# Patient Record
Sex: Female | Born: 1965
Health system: Southern US, Community
[De-identification: ages and names within clinical notes are randomized; demographics above are authoritative.]

## PROBLEM LIST (undated history)

## (undated) DIAGNOSIS — Z87442 Personal history of urinary calculi: Secondary | ICD-10-CM

## (undated) DIAGNOSIS — M069 Rheumatoid arthritis, unspecified: Secondary | ICD-10-CM

## (undated) DIAGNOSIS — R7303 Prediabetes: Secondary | ICD-10-CM

## (undated) DIAGNOSIS — G4730 Sleep apnea, unspecified: Secondary | ICD-10-CM

## (undated) HISTORY — PX: TUBAL LIGATION: SHX77

## (undated) HISTORY — PX: ROTATOR CUFF REPAIR: SHX139

## (undated) HISTORY — PX: FRACTURE SURGERY: SHX138

## (undated) HISTORY — PX: ANKLE FRACTURE SURGERY: SHX122

## (undated) HISTORY — PX: HAND SURGERY: SHX662

---

## 2000-04-29 ENCOUNTER — Encounter (INDEPENDENT_AMBULATORY_CARE_PROVIDER_SITE_OTHER): Payer: Self-pay | Admitting: Specialist

## 2000-04-29 ENCOUNTER — Inpatient Hospital Stay (HOSPITAL_COMMUNITY): Admission: AD | Admit: 2000-04-29 | Discharge: 2000-05-02 | Payer: Self-pay | Admitting: Obstetrics and Gynecology

## 2000-05-03 ENCOUNTER — Encounter: Admission: RE | Admit: 2000-05-03 | Discharge: 2000-07-08 | Payer: Self-pay | Admitting: Obstetrics and Gynecology

## 2001-07-13 ENCOUNTER — Other Ambulatory Visit: Admission: RE | Admit: 2001-07-13 | Discharge: 2001-07-13 | Payer: Self-pay | Admitting: Obstetrics and Gynecology

## 2002-08-10 ENCOUNTER — Other Ambulatory Visit: Admission: RE | Admit: 2002-08-10 | Discharge: 2002-08-10 | Payer: Self-pay | Admitting: Obstetrics and Gynecology

## 2003-10-27 ENCOUNTER — Other Ambulatory Visit: Admission: RE | Admit: 2003-10-27 | Discharge: 2003-10-27 | Payer: Self-pay | Admitting: Obstetrics and Gynecology

## 2004-03-08 ENCOUNTER — Encounter: Admission: RE | Admit: 2004-03-08 | Discharge: 2004-03-08 | Payer: Self-pay | Admitting: Family Medicine

## 2004-11-04 ENCOUNTER — Other Ambulatory Visit: Admission: RE | Admit: 2004-11-04 | Discharge: 2004-11-04 | Payer: Self-pay | Admitting: Obstetrics and Gynecology

## 2006-10-08 ENCOUNTER — Inpatient Hospital Stay (HOSPITAL_COMMUNITY): Admission: EM | Admit: 2006-10-08 | Discharge: 2006-10-15 | Payer: Self-pay | Admitting: Emergency Medicine

## 2006-10-12 ENCOUNTER — Encounter (INDEPENDENT_AMBULATORY_CARE_PROVIDER_SITE_OTHER): Payer: Self-pay | Admitting: Specialist

## 2008-11-27 ENCOUNTER — Ambulatory Visit (HOSPITAL_BASED_OUTPATIENT_CLINIC_OR_DEPARTMENT_OTHER): Admission: RE | Admit: 2008-11-27 | Discharge: 2008-11-27 | Payer: Self-pay | Admitting: Orthopedic Surgery

## 2009-09-10 ENCOUNTER — Other Ambulatory Visit: Admission: RE | Admit: 2009-09-10 | Discharge: 2009-09-10 | Payer: Self-pay | Admitting: Family Medicine

## 2009-10-20 ENCOUNTER — Emergency Department (HOSPITAL_COMMUNITY): Admission: EM | Admit: 2009-10-20 | Discharge: 2009-10-20 | Payer: Self-pay | Admitting: Emergency Medicine

## 2010-09-17 ENCOUNTER — Other Ambulatory Visit: Payer: Self-pay | Admitting: Physician Assistant

## 2010-09-17 ENCOUNTER — Other Ambulatory Visit (HOSPITAL_COMMUNITY)
Admission: RE | Admit: 2010-09-17 | Discharge: 2010-09-17 | Disposition: A | Discharge status: Home / Self Care | Payer: BC Managed Care – PPO | Source: Ambulatory Visit | Attending: Family Medicine | Admitting: Family Medicine

## 2010-09-17 DIAGNOSIS — Z01419 Encounter for gynecological examination (general) (routine) without abnormal findings: Secondary | ICD-10-CM | POA: Insufficient documentation

## 2010-09-24 LAB — POCT I-STAT, CHEM 8
BUN: 10 mg/dL (ref 6–23)
Calcium, Ion: 1.17 mmol/L (ref 1.12–1.32)
Chloride: 104 mEq/L (ref 96–112)
Creatinine, Ser: 0.8 mg/dL (ref 0.4–1.2)
Glucose, Bld: 88 mg/dL (ref 70–99)
HCT: 41 % (ref 36.0–46.0)
Hemoglobin: 13.9 g/dL (ref 12.0–15.0)
Potassium: 3.5 mEq/L (ref 3.5–5.1)
Sodium: 139 mEq/L (ref 135–145)
TCO2: 25 mmol/L (ref 0–100)

## 2010-09-24 LAB — URINALYSIS, ROUTINE W REFLEX MICROSCOPIC
Glucose, UA: NEGATIVE mg/dL
Leukocytes, UA: NEGATIVE
Nitrite: NEGATIVE
Protein, ur: NEGATIVE mg/dL
Specific Gravity, Urine: 1.024 (ref 1.005–1.030)
Urobilinogen, UA: 0.2 mg/dL (ref 0.0–1.0)
pH: 6 (ref 5.0–8.0)

## 2010-09-24 LAB — CBC
HCT: 40.4 % (ref 36.0–46.0)
Hemoglobin: 14.2 g/dL (ref 12.0–15.0)
MCHC: 35 g/dL (ref 30.0–36.0)
MCV: 83.1 fL (ref 78.0–100.0)
Platelets: 307 10*3/uL (ref 150–400)
RBC: 4.86 MIL/uL (ref 3.87–5.11)
RDW: 12.5 % (ref 11.5–15.5)
WBC: 12 10*3/uL — ABNORMAL HIGH (ref 4.0–10.5)

## 2010-09-24 LAB — DIFFERENTIAL
Basophils Absolute: 0 10*3/uL (ref 0.0–0.1)
Basophils Relative: 0 % (ref 0–1)
Eosinophils Absolute: 0.3 10*3/uL (ref 0.0–0.7)
Eosinophils Relative: 2 % (ref 0–5)
Lymphocytes Relative: 19 % (ref 12–46)
Lymphs Abs: 2.3 10*3/uL (ref 0.7–4.0)
Monocytes Absolute: 1.3 10*3/uL — ABNORMAL HIGH (ref 0.1–1.0)
Monocytes Relative: 11 % (ref 3–12)
Neutro Abs: 8.1 10*3/uL — ABNORMAL HIGH (ref 1.7–7.7)
Neutrophils Relative %: 68 % (ref 43–77)

## 2010-09-24 LAB — URINE CULTURE: Colony Count: 2000

## 2010-09-24 LAB — URINE MICROSCOPIC-ADD ON

## 2010-10-15 LAB — POCT HEMOGLOBIN-HEMACUE: Hemoglobin: 13.6 g/dL (ref 12.0–15.0)

## 2010-11-19 NOTE — Op Note (Signed)
[name], [name]                ACCOUNT NO.:  [id number]   MEDICAL RECORD NO.:  [id number]          PATIENT TYPE:  AMB   LOCATION:  DSC                          FACILITY:  [facility]   PHYSICIAN:  Robert A. Thurston Hole, M.D. DATE OF BIRTH:  1965/11/22   DATE OF PROCEDURE:  11/27/2008  DATE OF DISCHARGE:                               OPERATIVE REPORT   PREOPERATIVE DIAGNOSES:  1. Right shoulder partial rotator cuff tear.  2. Right shoulder partial labrum tear.  3. Right shoulder impingement.  4. Right shoulder acromioclavicular joint degenerative joint disease      and spurring.   POSTOPERATIVE DIAGNOSES:  1. Right shoulder partial rotator cuff tear.  2. Right shoulder partial labrum tear.  3. Right shoulder impingement.  4. Right shoulder acromioclavicular joint degenerative joint disease      and spurring.   PROCEDURE:  1. Right shoulder examination under anesthesia followed by      arthroscopic debridement partial rotator cuff tear and partial      labrum tear.  2. Right shoulder subacromial decompression.  3. Right shoulder distal clavicle excision.   SURGEON:  Elana Alm. Thurston Hole, MD   ASSISTANT:  Julien Girt, PA   ANESTHESIA:  General.   OPERATIVE TIME:  45 minutes.   COMPLICATIONS:  None.   INDICATIONS FOR PROCEDURE:  [name] is a 42-year woman who has had over  6 months of increasing right shoulder pain with exam and MRI documenting  partial versus complete rotator cuff tear with impingement labral tear  and AC joint arthropathy.  She has failed conservative care and is now  to undergo arthroscopy.   DESCRIPTION:  [name] was brought to the operating room on Nov 27, 2008, after an interscalene block was placed on holding room by  anesthesia.  She was placed on operative table in supine position.  She  received vancomycin 1 g IV preoperatively for prophylaxis.  After being  placed under general anesthesia, her right shoulder was examined.  She  had full range  of motion.  Her shoulder was stable to ligamentous exam.  She was then placed in beach chair position, her shoulder arm was  prepped using sterile DuraPrep and draped using sterile technique.  Originally, through a posterior arthroscopic portal, the arthroscope  with a pump attached and was placed into an anterior portal.  Arthroscopic probe was placed.  On initial inspection, the articular  cartilage and a glenohumeral joint was intact.  She had partial tearing  in the anterior, superior, and posterior labrum 25-30%, which was  debrided.  Biceps tendon anchor and biceps tendon was intact.  Anterior  inferior labrum and anterior inferior glenohumeral ligament complex was  intact.  The rotator cuff showed partial tearing 25% of the  supraspinatus, which was debrided, but it was otherwise well attached.  The rest of the rotator cuff was intact.  Inferior capsular recess free  of pathology.  Subacromial space was entered and a lateral arthroscopic  portal was made.  Moderately thickened bursitis was resected.  The  rotator cuff was very inflamed and thickened on the  bursal surface, but  no evidence of significant tearing.  Impingement was noted and the  subacromial decompression was carried out removing 6-8 mm of the  undersurface of the anterior, anterolateral, anteromedial acromion and  CA ligament release carried out as well.  The [facility] joint showed  significant spurring and degenerative changes and the distal 6 mm of the  clavicle was resected with a 6-mm bur.  After this was done, the  shoulder could be brought through full range of motion with no  impingement on the rotator cuff.  At this point, it was felt that all  pathology had been satisfactorily addressed.  Instruments were removed.  Portals were closed with 3-0 nylon suture.  The shoulder injected with  80 mg of Depo-Medrol and 10 mg of 0.2% Marcaine with epinephrine with  half of this in the joint and half in the subacromial space.   Sterile  dressings and a sling were then applied, and the patient awakened and  taken to recovery room in stable condition.   FOLLOWUP CARE:  [name] will be followed as an outpatient on Nucynta  and Robaxin with early physical therapy.  She will be back in the office  in a week for sutures out and follow up.      Robert A. Thurston Hole, M.D.  Electronically Signed     RAW/MEDQ  D:  11/27/2008  T:  11/28/2008  Job:  [id number]

## 2010-11-22 NOTE — Op Note (Signed)
NAMEJANAYIA, [name]                ACCOUNT NO.:  [id number]   MEDICAL RECORD NO.:  [id number]          PATIENT TYPE:  INP   LOCATION:  6712                         FACILITY:  [facility]   PHYSICIAN:  John C. Madilyn Fireman, M.D.    DATE OF BIRTH:  12-Apr-1966   DATE OF PROCEDURE:  10/12/2006  DATE OF DISCHARGE:                               OPERATIVE REPORT   PROCEDURE:  Colonoscopy with biopsy.   INDICATIONS FOR PROCEDURE:  Persistent rectal bleeding, abdominal cramps  and diarrhea, not responding to Cipro and Flagyl.   PROCEDURE:  The patient was placed in the left lateral decubitus  position and placed on the pulse monitor with continuous low-flow oxygen  delivered by nasal cannula.  She was sedated with 100 mcg IV fentanyl  and 10 mg IV Versed.  The Olympus video colonoscope was inserted into  the rectum and advanced to the cecum, confirmed by transillumination of  McBurney's point and visualization of the ileocecal valve and  appendiceal orifice.  Prep was good.  The cecum and ascending colon  appeared normal with no masses, polyps, diverticula or other mucosal  abnormalities.  From the transverse colon down to the rectum, there was  erythema, granularity, and friability in a diffuse distribution without  any deep ulcers, pseudomembranes, or submucosal hemorrhages. Overall,  this was felt most consistent with ulcerative colitis, although  infectious colitis could not be ruled out.  Multiple random biopsies  were taken.  No other lesions were seen.  The rectum was not spared the  inflammation.  The scope was then withdrawn.  The patient returned to  the recovery room in stable condition.  She tolerated the procedure  well.  There were no immediate complications.   IMPRESSION:  Diffuse colitis estimated to the proximal or mid transverse  colon.   PLAN:  We will await biopsy results and will go ahead and begin  corticosteroids since he has already been started on antibiotics.     ______________________________  Everardo All Madilyn Fireman, M.D.     JCH/MEDQ  D:  10/12/2006  T:  10/12/2006  Job:  [id number]   cc:   Lucita Ferrara, MD

## 2010-11-22 NOTE — Discharge Summary (Signed)
[name], [name]                ACCOUNT NO.:  [id number]   MEDICAL RECORD NO.:  [id number]          PATIENT TYPE:  INP   LOCATION:  6732                         FACILITY:  [facility]   PHYSICIAN:  Barnetta Chapel, MDDATE OF BIRTH:  [birth year]   DATE OF ADMISSION:  10/08/2006  DATE OF DISCHARGE:  10/15/2006                               DISCHARGE SUMMARY   PRIMARY CARE PHYSICIAN:  Donia Guiles, M.D.   DISCHARGE DIAGNOSES:  Colitis, likely ulcerative colitis.   DISCHARGE MEDICATIONS:  1. Tapering dose of  steroids (prednisone).  2. Asacol 800 mg p.o. t.i.d.   CONSULTATIONS:  The GI team consultation.  The patient was seen by GI  team.  The patient had a colonoscopy that revealed diffuse colitis.  The  colonoscopy findings were said to be suggestive of possible ulcerative  colitis.  The pathology report showed inflammation and abscesses.   IMAGING STUDIES:  A CT scan of the abdomen.   HISTORY OF PRESENT ILLNESS:  Please refer to the H&P done on October 08, 2006.  The patient is a 45 year old female who presented with severe  abdominal pain and bloated with diarrhea.  On admission the patient was  dehydrated and also hypokalemic.   HOSPITAL COURSE:  The patient was admitted to the regular medical floor.  The patient was kept n.p.o.  The patient was adequately re-hydrated.  The hypokalemia was repleted.  The patient was initially started on IV  Cipro and Flagyl.  A GI consultation was called to further assess the  patient.  The patient eventually had the colonoscopy that was suggestive  of possible ulcerative colitis.  The patient was started on steroids.  The antibiotics were discontinued.  The patient's symptoms improved  gradually.  The p.o. intake was gradually introduced.  The patient  continued to do well and will be discharged back home to the care of the  primary care [name].   DISCHARGE PLAN:  1. To discharge the patient home on the above medications.  2. Follow up  with her primary care [name] in one week.  3. Follow up with GI.  4. A tapering dose of prednisone.  5. Continue Asacol 800 mg p.o. t.i.d.  6. A regular diet.  7. Activities as tolerated.      Barnetta Chapel, MD  Electronically Signed     SIO/MEDQ  D:  01/26/2007  T:  01/26/2007  Job:  [id number]   cc:   Donia Guiles, M.D.

## 2010-11-22 NOTE — Op Note (Signed)
[facility]  Patient:    [name], [name]                         MRN: [id number] Proc. Date: 04/29/00 Attending:  Duke Salvia. Marcelle Overlie, M.D.                           Operative Report  PREOPERATIVE DIAGNOSIS:       Term intrauterine pregnancy, previous cesarean                               section, declines vaginal birth after cesarean                               section, and request permanent sterilization.  POSTOPERATIVE DIAGNOSIS:      Term intrauterine pregnancy, previous cesarean                               section, declines vaginal birth after cesarean                               section, and request permanent sterilization.  OPERATION:                    Repeat low transverse cesarean section and                               modified Pomeroy tubal ligation, excision of                               skin lesion on the mons.  SURGEON:                      Duke Salvia. Marcelle Overlie, M.D.  ANESTHESIA:                   Spinal.  COMPLICATIONS:                None.  DRAINS:                       Foley catheter.  ESTIMATED BLOOD LOSS:         800.  DESCRIPTION OF PROCEDURE AND FINDINGS:                 The patient was taken to the operating room and after an adequate level of spinal anesthetic was obtained with the patient in the left tilt position, the abdomen was prepped and draped in the usual manner for ________.  A Foley catheter was positioned and draining clear urine.  A transverse incision was made through the old scar which was well-healed and carried down to the fascia which was incised and extended transversely.  The rectus muscle was divided in the midline and the peritoneum entered superiorly without incident and extended in a vertical manner.  The vesicouterine serosa was then incised and the bladder was bluntly and sharply dissected off of the lower uterine segment.  The bladder blade was positioned.  A transverse incision was  made in the lower  segment and extended with bandaged scissors. Clear fluid then noted.  The patient then delivered of a female, Apgars of 8 and 9 from the vertex presentation without difficulty.  The infant was suctioned and the cord clamped, and passed to the pediatric team for further care.  The placenta was removed manually and tagged.  The uterus exteriorized and the cavity wiped clean with the laparotomy pad.  Closure was obtained with the first layer of 0 chromic in a locked fashion followed by an imbricating layer of 0 chromic.  This was hemostatic.  The bladder flap area was inspected and noted to be intact and hemostatic.  Tubes and ovaries were normal.                                Starting at the mid segment on the right, a Babcock clamp was used to grasp the tube.  A 0 plain suture was then tied around each end of the mid segment on both tubes which was excised, and the cut end cauterized with the Bovie, and a 2-0 silk suture placed around the proximal segment.  This area was hemostatic.  The exact same repeated on the opposite side.                                Prior to closure, sponge, needle, and instrument counts were reported as correct x 2.  The rectus muscles were reapproximated with 2-0 Dexon interrupted sutures.  The fascia was closed from laterally to midline on either side with a 0 Dexon running suture.  The subcutaneous fat was hemostatic.  Clips and Steri-Strips were used on the skin.  A skin lesion from the mons was excised in an ellipse.  Four interrupted 4-0 nylon sutures were used to reapproximate the skin with a dressing applied.  She tolerated this well and went to the recovery room in good condition.  Clear urine noted at the end of the case.  She also received Pitocin IV and Ancef 1 g IV after the cord was clamped. DD:  04/29/00 TD:  04/29/00 Job: [id number]

## 2010-11-22 NOTE — H&P (Signed)
NAMEALEESIA, HENNEY                ACCOUNT NO.:  [id number]   MEDICAL RECORD NO.:  [id number]          PATIENT TYPE:  INP   LOCATION:  1829                         FACILITY:  [facility]   PHYSICIAN:  Lucita Ferrara, MD         DATE OF BIRTH:  Nov 24, 1965   DATE OF ADMISSION:  10/08/2006  DATE OF DISCHARGE:                              HISTORY & PHYSICAL   Patient is a 45 year old female, who presented with severe abdominal  pain and fullness that has been getting progressively worse in the last  two or three days.  Patient then had bloody mucusy-type diarrhea that  was progressive.  Patient felt dehydrated.  She was unable to tolerate  p.o. intake.  The patient denies any focal nausea or vomiting.  Denies  any fever or chills.  Otherwise, review of systems is negative.  Denied  any cardiac-type chest pain.  Denies any focal neurological deficits.  Denies any recent antibiotic use.  Denies any recent travel.   PAST MEDICAL HISTORY:  Significant for similar type of episodes in the  past.   PAST SURGICAL HISTORY:  Patient denies a history of cholecystitis,  cholecystectomy or appendectomy.  Patient has had a c-section in the  past.   MEDICATIONS:  Patient has taken Effexor XR.  Patient also taking  hyoscyamine sulfate 0.125 mg as needed.   ALLERGIES:  CODEINE.  APPARENTLY, PATIENT GETS A RASH.   PHYSICAL EXAMINATION:  GENERAL:  Patient is in no acute distress.  VITAL SIGNS:  Temperature 98.3, blood pressure 111/80, pulse 101,  respirations 16, pulse ox 100% on room air.  HEENT:  Normocephalic, atraumatic.  Sclerae anicteric.  Mucus membranes  moist, no JVD, no carotid bruits.  NECK:  Supple, PERRLA.  Extraocular muscles intact.  CARDIOVASCULAR:  S1, S2, regular rate and rhythm.  No murmurs, rubs,  clicks.  ABDOMEN:  Soft, diffuse generalized tenderness to light and deep  palpation.  No focal right lower quadrant or right upper quadrant pain  or tenderness.  No guarding, no  hepatosplenomegaly.  NEURO:  Alert and oriented x3.  Cranial nerves 2-12 grossly intact.  EXTREMITIES:  No clubbing, cyanosis or edema.   LABORATORY DATA:  Urinalysis is negative.  White count 9, H&H stable at  12.2/36.1.  Stool for guaiac blood positive.  Potassium low at 3.2,  urine 17, creatinine 0.61, AST and ALT within normal limits.  Lipase 17.  AST and ALT are 24 and 27 respectively.  CT scan of the abdomen shows  normal appendix, wall thickening and inflammatory changes and adjacent  adenopathy of the hepatic flexure of the colon, most likely inflammatory  process, also consider neoplasm, ischemia less likely.  There is also a  heterogenous uterus, question of fibroid.  Patient was recommended to  get a pelvic ultrasound.   ASSESSMENT/PLAN:  A 45 year old female with diffuse colitis, due to  unknown cause, inflammatory versus infectious.  Will go ahead and admit  for IV hydration, IV antibiotics with Ciprofloxacin 400 mg IV daily and  metronidazole 500 mg IV t.i.d.  Also, will go ahead and place  the  patient on N.P.O diet, until tolerating a diet.  Also, go ahead and  repeat potassium with 40 mEq of Kay Ciel to 100 cc of normal saline.  Also, patient will get a pelvic ultrasound, to rule out any pelvic-  related causes for abdominal  pain.  Stool will be tested for fecal  leukocytes and fat.  Will go ahead and consult gastroenterology as  appropriate and needed, to rule out inflammatory bowel disease,  unlikely.  Patient is hemodynamically stable, and has been explained the  plans and procedures of this admission.  Pain will be relieved with  Dilaudid 2 mg IV q.4 h. as needed for pain, and also if patient is  getting itching, will go ahead and institute Benadryl.      Lucita Ferrara, MD  Electronically Signed     RR/MEDQ  D:  10/08/2006  T:  10/08/2006  Job:  [id number]   cc:   Donia Guiles, M.D.

## 2010-11-22 NOTE — Discharge Summary (Signed)
[facility]  Patient:    [name], [name]                       MRN: [id number] Adm. Date:  04/29/00 Disc. Date: 05/02/00 Attending:  Rhina Brackett Dictator:   Danie Chandler, R.N.                           Discharge Summary  ADMISSION DIAGNOSIS:          Term intrauterine pregnancy with previous cesarean section.  Declines vaginal birth after cesarean section and requests permanent sterilization.  DISCHARGE DIAGNOSIS:          Term intrauterine pregnancy with previous cesarean section.  Declines vaginal birth after cesarean section and requests permanent sterilization.  PROCEDURE:                    On April 29, 2000, repeat low transverse cesarean section and modified Pomeroy tubal ligation and excision of skin lesion on the mons.  REASON FOR ADMISSION:         Please see dictated H&P.  HOSPITAL COURSE:              The patient was taken to the operating room and underwent the above-named procedure without complications.  This was productive of a viable female infant with Apgars of 8 at one minute and 9 at five minutes.  Postoperatively, the patient did well.  On postoperative day #1, the patients hemoglobin was 9.9, hematocrit 27.8, and white blood cell count 10.9.  The patient had a good return of bowel function on this day and good control of pain.  On postoperative day #2, she was ambulating well without difficulty and tolerating a regular diet.  She was discharged home on postoperative day #3.  CONDITION ON DISCHARGE:       Good.  DIET:                         Regular as tolerated.  ACTIVITY:                     No heavy lifting, no driving, no vaginal entry.  FOLLOW-UP:                    She is to follow up in the office in 1-2 weeks for incision check.  DISCHARGE INSTRUCTIONS:       She is to call for temperature greater than 100 degrees, persistent nausea or vomiting, heavy vaginal bleeding, and/or redness or draining  from the incision site.  DISCHARGE MEDICATIONS:        1. Prenatal vitamin 1 p.o. q.d.                               2. Tylox #30 one to two p.o. q.4-6h. p.r.n.                                  pain. DD:  05/20/00 TD:  05/20/00 Job: [id number]

## 2010-11-22 NOTE — Consult Note (Signed)
[name], [name]                ACCOUNT NO.:  [id number]   MEDICAL RECORD NO.:  [id number]          PATIENT TYPE:  INP   LOCATION:  6712                         FACILITY:  [facility]   PHYSICIAN:  John C. Madilyn Fireman, M.D.    DATE OF BIRTH:  July 23, 1965   DATE OF CONSULTATION:  10/10/2006  DATE OF DISCHARGE:                                 CONSULTATION   REASON FOR CONSULTATION:  Colitis.   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  This all  began approximately a week ago when she was seeing blood x5 days ago.  She was seen by her primary care physician and started on hyoscyamine  and Pepcid and Imodium without much improvement.  The next 2 or 3 days  her pain and bleeding intensified with more mucus in the stools.  She  was admitted on October 08, 2006, found to be afebrile and has had one  temperature greater than 100 since she has been here with a WBC count  normal and abdominal CT scan on presentation showed wall thickening of  the colon with some slight adjacent adenopathy near the hepatic flexure  with more doubtful thickening of the descending colon as well.  She has  been started on Cipro and Flagyl.  She had a negative C. difficile toxin  and did not have stool for enteric pathogens ordered.  She really has  not noticed dramatic improvement since she has been in here in terms of  cramping but perhaps slightly less blood.   PAST MEDICAL HISTORY:  Essentially unremarkable.   SURGERIES:  C-section.   MEDICATIONS:  Effexor.   ALLERGIES:  CODEINE.   FAMILY HISTORY:  Positive for colon cancer in her grandfather.   REVIEW OF SYSTEMS:  Negative for recent antibiotics, recent travel, sick  contacts or suspicious food ingestion.  She gets her drinking water from  the city.   PHYSICAL EXAMINATION:  GENERAL:   Well-developed, well-nourished white  female in no acute distress.  HEART:  Regular rate and rhythm without murmur.  LUNGS:  Clear.  ABDOMEN:  Soft, nondistended with normoactive bowel sounds, no  hepatosplenomegaly, mass or guarding.   IMPRESSION:  Colitis unclear whether infectious inflammatory bowel  disease, ischemic and neoplasm the least likely is not conclusively  ruled out.   PLAN:  I think she will probably need colonoscopy due to the uncertainty  regarding the specific etiology of her colitis.  I will give this 2 or 3  days to improve and if improves dramatically can possibly delay it until  outpatient workup, if it does not continue to improve steadily we will  probably perform in the next 2 days.           ______________________________  Everardo All Madilyn Fireman, M.D.     JCH/MEDQ  D:  10/10/2006  T:  10/10/2006  Job:  [id number]   cc:   Lucita Ferrara, MD

## 2010-12-13 ENCOUNTER — Ambulatory Visit
Admission: RE | Admit: 2010-12-13 | Discharge: 2010-12-13 | Disposition: A | Discharge status: Home / Self Care | Payer: BC Managed Care – PPO | Source: Ambulatory Visit | Attending: Family Medicine | Admitting: Family Medicine

## 2010-12-13 ENCOUNTER — Other Ambulatory Visit: Payer: Self-pay | Admitting: Family Medicine

## 2010-12-13 DIAGNOSIS — R062 Wheezing: Secondary | ICD-10-CM

## 2010-12-13 DIAGNOSIS — R05 Cough: Secondary | ICD-10-CM

## 2010-12-13 DIAGNOSIS — R059 Cough, unspecified: Secondary | ICD-10-CM

## 2011-04-03 ENCOUNTER — Other Ambulatory Visit: Payer: Self-pay | Admitting: Family Medicine

## 2011-04-03 DIAGNOSIS — Z1231 Encounter for screening mammogram for malignant neoplasm of breast: Secondary | ICD-10-CM

## 2011-04-25 ENCOUNTER — Ambulatory Visit
Admission: RE | Admit: 2011-04-25 | Discharge: 2011-04-25 | Disposition: A | Discharge status: Home / Self Care | Payer: BC Managed Care – PPO | Source: Ambulatory Visit | Attending: Family Medicine | Admitting: Family Medicine

## 2011-04-25 DIAGNOSIS — Z1231 Encounter for screening mammogram for malignant neoplasm of breast: Secondary | ICD-10-CM

## 2011-04-29 ENCOUNTER — Other Ambulatory Visit: Payer: Self-pay | Admitting: Family Medicine

## 2011-04-29 DIAGNOSIS — R928 Other abnormal and inconclusive findings on diagnostic imaging of breast: Secondary | ICD-10-CM

## 2011-05-16 ENCOUNTER — Other Ambulatory Visit: Payer: Self-pay | Admitting: Family Medicine

## 2011-05-16 ENCOUNTER — Ambulatory Visit
Admission: RE | Admit: 2011-05-16 | Discharge: 2011-05-16 | Disposition: A | Discharge status: Home / Self Care | Payer: BC Managed Care – PPO | Source: Ambulatory Visit | Attending: Family Medicine | Admitting: Family Medicine

## 2011-05-16 ENCOUNTER — Other Ambulatory Visit: Payer: Self-pay | Admitting: Diagnostic Radiology

## 2011-05-16 DIAGNOSIS — R928 Other abnormal and inconclusive findings on diagnostic imaging of breast: Secondary | ICD-10-CM

## 2011-12-06 ENCOUNTER — Emergency Department (HOSPITAL_COMMUNITY)
Admission: EM | Admit: 2011-12-06 | Discharge: 2011-12-06 | Disposition: A | Discharge status: Home / Self Care | Payer: BC Managed Care – PPO | Attending: Emergency Medicine | Admitting: Emergency Medicine

## 2011-12-06 ENCOUNTER — Encounter (HOSPITAL_COMMUNITY): Payer: Self-pay | Admitting: Emergency Medicine

## 2011-12-06 ENCOUNTER — Emergency Department (HOSPITAL_COMMUNITY): Payer: BC Managed Care – PPO

## 2011-12-06 DIAGNOSIS — M7989 Other specified soft tissue disorders: Secondary | ICD-10-CM | POA: Insufficient documentation

## 2011-12-06 DIAGNOSIS — W010XXA Fall on same level from slipping, tripping and stumbling without subsequent striking against object, initial encounter: Secondary | ICD-10-CM | POA: Insufficient documentation

## 2011-12-06 DIAGNOSIS — IMO0002 Reserved for concepts with insufficient information to code with codable children: Secondary | ICD-10-CM | POA: Insufficient documentation

## 2011-12-06 DIAGNOSIS — M79609 Pain in unspecified limb: Secondary | ICD-10-CM | POA: Insufficient documentation

## 2011-12-06 DIAGNOSIS — S62619A Displaced fracture of proximal phalanx of unspecified finger, initial encounter for closed fracture: Secondary | ICD-10-CM

## 2011-12-06 MED ORDER — OXYCODONE-ACETAMINOPHEN 5-325 MG PO TABS
2.0000 | ORAL_TABLET | Freq: Once | ORAL | Status: AC
  Administered 2011-12-06: 2 via ORAL
  Filled 2011-12-06: qty 2

## 2011-12-06 MED ORDER — OXYCODONE-ACETAMINOPHEN 5-325 MG PO TABS: 1.0000 | ORAL_TABLET | Freq: Four times a day (QID) | ORAL | Status: AC | PRN

## 2011-12-06 NOTE — ED Notes (Signed)
Pt fell, caught self on L hand. Pt states 5th digit was deformed but she "popped it back in place". Pt presents w/ swelling and bruising to L hand.

## 2011-12-06 NOTE — Discharge Instructions (Signed)
You have a comminuted proximal phalanx fracture of you pinky finger.  It is important for you to follow up with Hand Surgery.  Take Percocet for severe pain.  Do not drive or operate heavy machinery while taking pain medication.

## 2011-12-06 NOTE — ED Notes (Signed)
Pt alert, nad, c/o left hand pain , onset today s/p slip fall injury, pMs intact, cap refill <3sec

## 2011-12-06 NOTE — ED Provider Notes (Signed)
History     CSN: [id number]  Arrival date & time 12/06/11  2021   First MD Initiated Contact with Patient 12/06/11 2108      Chief Complaint  Patient presents with  . Hand Injury    (Consider location/radiation/quality/duration/timing/severity/associated sxs/prior treatment) HPI Comments: Patient reports that while she was at her Daughter's [employer] game earlier today she tripped over a goal that was in the walkway.  She then landed on her left hand on the concrete.  She is now having pain of the proximal 4th and 5th digit of the left hand.  She has also noticed some bruising and swelling over that area.  Injury occurred approximately 12 hours prior to arrival in the ED.  She reports that she was given ice and some sort of a splint while at the game.  She has not taken anything for pain.  She denies any numbness.   The history is provided by the patient.    History reviewed. No pertinent past medical history.  Past Surgical History  Procedure Date  . Cesarean section   . Fracture surgery     No family history on file.  History  Substance Use Topics  . Smoking status: Never Smoker   . Smokeless tobacco: Not on file  . Alcohol Use: No    OB History    Grav Para Term Preterm Abortions TAB SAB Ect Mult Living                  Review of Systems  Constitutional: Negative for fever and chills.  Gastrointestinal: Negative for nausea and vomiting.  Musculoskeletal: Positive for joint swelling.       Hand pain  Skin: Positive for color change.       bruising  Neurological: Negative for numbness.    Allergies  Sulfa antibiotics; Codeine; and Penicillins  Home Medications   Current Outpatient Rx  Name Route Sig Dispense Refill  . VENLAFAXINE HCL 50 MG PO TABS Oral Take 50 mg by mouth daily.      BP 134/89  Pulse 80  Temp 98 F (36.7 C)  Resp 16  SpO2 99%  LMP 11/15/2011  Physical Exam  Nursing note and vitals reviewed. Constitutional: She appears  well-developed and well-nourished. No distress.  HENT:  Head: Normocephalic and atraumatic.  Cardiovascular: Normal rate, regular rhythm and normal heart sounds.   Pulses:      Radial pulses are 2+ on the right side, and 2+ on the left side.  Pulmonary/Chest: Effort normal and breath sounds normal.  Neurological: She is alert. No sensory deficit. Gait normal.  Skin: Skin is warm and dry. She is not diaphoretic.       Swelling and bruising to the dorsal left hand over the 4th and 5th phalanges and metacarpal bones.    ED Course  Procedures (including critical care time)  Labs Reviewed - No data to display Dg Hand Complete Left  12/06/2011  *RADIOLOGY REPORT*  Clinical Data: Pain and swelling of the left hand and fifth finger after injury.  LEFT HAND - COMPLETE 3+ VIEW  Comparison: None.  Findings: Comminuted oblique fracture of the proximal aspect of the proximal phalanx of the left fifth finger with fracture lines extending from the mid shaft down to the articular surface at the metacarpal phalangeal joint.  Mild soft tissue swelling.  No other fractures identified.  Mild degenerative changes in the interphalangeal joints.  IMPRESSION: Comminuted fractures of the proximal phalanx of the left fifth  finger, with fracture lines extending to the articular surface and with mild dorsal angulation.  Original Report Authenticated By: Marlon Pel, M.D.     No diagnosis found.    MDM  Patient with closed comminuted fracture of the left 5th phalange.  She is neurovascularly intact.  Wedding ring removed with ring cutter.  Patient put in ulnar gutter splint and given follow up with Hand Surgery.          Pascal Lux Hardin, PA-C 12/07/11 [phone]

## 2011-12-07 NOTE — ED Provider Notes (Signed)
Medical screening examination/treatment/procedure(s) were performed by non-physician practitioner and as supervising physician I was immediately available for consultation/collaboration.  Jamyah Folk T Aadya Kindler, MD 12/07/11 1555 

## 2012-01-02 ENCOUNTER — Encounter (HOSPITAL_BASED_OUTPATIENT_CLINIC_OR_DEPARTMENT_OTHER): Payer: Self-pay

## 2012-01-02 ENCOUNTER — Emergency Department (HOSPITAL_BASED_OUTPATIENT_CLINIC_OR_DEPARTMENT_OTHER)
Admission: EM | Admit: 2012-01-02 | Discharge: 2012-01-02 | Disposition: A | Discharge status: Home / Self Care | Payer: BC Managed Care – PPO | Attending: Emergency Medicine | Admitting: Emergency Medicine

## 2012-01-02 DIAGNOSIS — Z885 Allergy status to narcotic agent status: Secondary | ICD-10-CM | POA: Insufficient documentation

## 2012-01-02 DIAGNOSIS — Y831 Surgical operation with implant of artificial internal device as the cause of abnormal reaction of the patient, or of later complication, without mention of misadventure at the time of the procedure: Secondary | ICD-10-CM | POA: Insufficient documentation

## 2012-01-02 DIAGNOSIS — Z880 Allergy status to penicillin: Secondary | ICD-10-CM | POA: Insufficient documentation

## 2012-01-02 DIAGNOSIS — T8140XA Infection following a procedure, unspecified, initial encounter: Secondary | ICD-10-CM | POA: Insufficient documentation

## 2012-01-02 DIAGNOSIS — Z882 Allergy status to sulfonamides status: Secondary | ICD-10-CM | POA: Insufficient documentation

## 2012-01-02 MED ORDER — VANCOMYCIN HCL IN DEXTROSE 1-5 GM/200ML-% IV SOLN
1000.0000 mg | Freq: Once | INTRAVENOUS | Status: AC
  Administered 2012-01-02: 1000 mg via INTRAVENOUS

## 2012-01-02 MED ORDER — SODIUM CHLORIDE 0.9 % IV SOLN: Freq: Once | INTRAVENOUS | Status: DC

## 2012-01-02 NOTE — ED Notes (Signed)
Pt brought her own premixed bag of Vancomycin 1gram to be infused.
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
## 2012-05-07 ENCOUNTER — Other Ambulatory Visit: Payer: Self-pay | Admitting: Family Medicine

## 2012-05-07 DIAGNOSIS — Z1231 Encounter for screening mammogram for malignant neoplasm of breast: Secondary | ICD-10-CM

## 2012-06-15 ENCOUNTER — Ambulatory Visit
Admission: RE | Admit: 2012-06-15 | Discharge: 2012-06-15 | Disposition: A | Discharge status: Home / Self Care | Payer: BC Managed Care – PPO | Source: Ambulatory Visit | Attending: Family Medicine | Admitting: Family Medicine

## 2012-06-15 DIAGNOSIS — Z1231 Encounter for screening mammogram for malignant neoplasm of breast: Secondary | ICD-10-CM

## 2013-06-22 ENCOUNTER — Other Ambulatory Visit: Payer: Self-pay | Admitting: Family Medicine

## 2013-06-22 DIAGNOSIS — Z1231 Encounter for screening mammogram for malignant neoplasm of breast: Secondary | ICD-10-CM

## 2013-06-29 ENCOUNTER — Ambulatory Visit
Admission: RE | Admit: 2013-06-29 | Discharge: 2013-06-29 | Disposition: A | Discharge status: Home / Self Care | Payer: BC Managed Care – PPO | Source: Ambulatory Visit | Attending: Family Medicine | Admitting: Family Medicine

## 2013-06-29 DIAGNOSIS — Z1231 Encounter for screening mammogram for malignant neoplasm of breast: Secondary | ICD-10-CM

## 2014-07-05 ENCOUNTER — Other Ambulatory Visit: Payer: Self-pay | Admitting: Family Medicine

## 2014-07-05 ENCOUNTER — Other Ambulatory Visit (HOSPITAL_COMMUNITY)
Admission: RE | Admit: 2014-07-05 | Discharge: 2014-07-05 | Disposition: A | Discharge status: Home / Self Care | Payer: BLUE CROSS/BLUE SHIELD | Source: Ambulatory Visit | Attending: Family Medicine | Admitting: Family Medicine

## 2014-07-05 DIAGNOSIS — Z1151 Encounter for screening for human papillomavirus (HPV): Secondary | ICD-10-CM | POA: Diagnosis present

## 2014-07-05 DIAGNOSIS — Z01419 Encounter for gynecological examination (general) (routine) without abnormal findings: Secondary | ICD-10-CM | POA: Diagnosis present

## 2014-07-06 LAB — CYTOLOGY - PAP

## 2015-08-10 ENCOUNTER — Other Ambulatory Visit: Payer: Self-pay | Admitting: Family Medicine

## 2015-08-10 DIAGNOSIS — Z1231 Encounter for screening mammogram for malignant neoplasm of breast: Secondary | ICD-10-CM

## 2015-09-04 ENCOUNTER — Ambulatory Visit
Admission: RE | Admit: 2015-09-04 | Discharge: 2015-09-04 | Disposition: A | Discharge status: Home / Self Care | Payer: BLUE CROSS/BLUE SHIELD | Source: Ambulatory Visit | Attending: Family Medicine | Admitting: Family Medicine

## 2015-09-04 DIAGNOSIS — Z1231 Encounter for screening mammogram for malignant neoplasm of breast: Secondary | ICD-10-CM

## 2016-11-24 MED FILL — BUPROPION XL 150 MG TAB: 150 | 30 days supply | Qty: 30 | Fill #0

## 2016-11-24 MED FILL — VENLAFAXINE HCL ER 75 MG CA: 75 | 30 days supply | Qty: 60 | Fill #0

## 2016-12-26 MED FILL — VENLAFAXINE HCL ER 75 MG CA: 75 | 30 days supply | Qty: 60 | Fill #1

## 2017-01-27 DIAGNOSIS — R400 Somnolence: Secondary | ICD-10-CM | POA: Diagnosis not present

## 2017-01-27 MED FILL — VENLAFAXINE HCL ER 75 MG CA: 75 | 30 days supply | Qty: 60 | Fill #2

## 2017-02-26 MED FILL — VENLAFAXINE HCL ER 75 MG CA: 75 | 30 days supply | Qty: 60 | Fill #3

## 2017-03-20 ENCOUNTER — Other Ambulatory Visit (HOSPITAL_COMMUNITY)
Admission: RE | Admit: 2017-03-20 | Discharge: 2017-03-20 | Disposition: A | Discharge status: Home / Self Care | Payer: 59 | Source: Ambulatory Visit | Attending: Family Medicine | Admitting: Family Medicine

## 2017-03-20 ENCOUNTER — Other Ambulatory Visit: Payer: Self-pay | Admitting: Family Medicine

## 2017-03-20 DIAGNOSIS — J301 Allergic rhinitis due to pollen: Secondary | ICD-10-CM | POA: Diagnosis not present

## 2017-03-20 DIAGNOSIS — E669 Obesity, unspecified: Secondary | ICD-10-CM | POA: Diagnosis not present

## 2017-03-20 DIAGNOSIS — Z13220 Encounter for screening for lipoid disorders: Secondary | ICD-10-CM | POA: Diagnosis not present

## 2017-03-20 DIAGNOSIS — Z124 Encounter for screening for malignant neoplasm of cervix: Secondary | ICD-10-CM | POA: Insufficient documentation

## 2017-03-20 DIAGNOSIS — Z0000 Encounter for general adult medical examination without abnormal findings: Secondary | ICD-10-CM | POA: Diagnosis not present

## 2017-03-20 DIAGNOSIS — Z1231 Encounter for screening mammogram for malignant neoplasm of breast: Secondary | ICD-10-CM

## 2017-03-20 DIAGNOSIS — Z720 Tobacco use: Secondary | ICD-10-CM | POA: Diagnosis not present

## 2017-03-20 DIAGNOSIS — Z79899 Other long term (current) drug therapy: Secondary | ICD-10-CM | POA: Diagnosis not present

## 2017-03-20 DIAGNOSIS — R030 Elevated blood-pressure reading, without diagnosis of hypertension: Secondary | ICD-10-CM | POA: Diagnosis not present

## 2017-03-20 DIAGNOSIS — F322 Major depressive disorder, single episode, severe without psychotic features: Secondary | ICD-10-CM | POA: Diagnosis not present

## 2017-03-20 DIAGNOSIS — R319 Hematuria, unspecified: Secondary | ICD-10-CM | POA: Diagnosis not present

## 2017-03-20 DIAGNOSIS — A630 Anogenital (venereal) warts: Secondary | ICD-10-CM | POA: Diagnosis not present

## 2017-03-25 LAB — CYTOLOGY - PAP
Adequacy: ABSENT
Diagnosis: NEGATIVE
HPV: NOT DETECTED

## 2017-03-30 DIAGNOSIS — G4719 Other hypersomnia: Secondary | ICD-10-CM | POA: Diagnosis not present

## 2017-03-30 MED FILL — VENLAFAXINE HCL ER 75 MG CA: 75 | 30 days supply | Qty: 60 | Fill #4

## 2017-04-07 ENCOUNTER — Ambulatory Visit: Payer: BLUE CROSS/BLUE SHIELD

## 2017-04-21 MED FILL — GAVILYTE-N SOLUTION: 420 | 1 days supply | Qty: 4000 | Fill #0

## 2017-04-29 DIAGNOSIS — Z1211 Encounter for screening for malignant neoplasm of colon: Secondary | ICD-10-CM | POA: Diagnosis not present

## 2017-05-04 MED FILL — VENLAFAXINE HCL ER 75 MG CA: 75 | 30 days supply | Qty: 60 | Fill #5

## 2017-05-08 MED FILL — NORG-ETHIN ESTRA 0.25-0.035: 0.25-35 | 14 days supply | Qty: 28 | Fill #0

## 2017-06-04 MED FILL — VENLAFAXINE HCL ER 75 MG CA: 75 | 90 days supply | Qty: 180 | Fill #0

## 2017-06-18 ENCOUNTER — Other Ambulatory Visit (HOSPITAL_BASED_OUTPATIENT_CLINIC_OR_DEPARTMENT_OTHER): Payer: Self-pay

## 2017-06-18 DIAGNOSIS — G4710 Hypersomnia, unspecified: Secondary | ICD-10-CM

## 2017-06-18 DIAGNOSIS — R0683 Snoring: Secondary | ICD-10-CM

## 2017-06-18 DIAGNOSIS — G4730 Sleep apnea, unspecified: Secondary | ICD-10-CM

## 2017-07-08 DIAGNOSIS — Z780 Asymptomatic menopausal state: Secondary | ICD-10-CM | POA: Diagnosis not present

## 2017-07-08 DIAGNOSIS — N939 Abnormal uterine and vaginal bleeding, unspecified: Secondary | ICD-10-CM | POA: Diagnosis not present

## 2017-07-14 ENCOUNTER — Ambulatory Visit
Admission: RE | Admit: 2017-07-14 | Discharge: 2017-07-14 | Disposition: A | Discharge status: Home / Self Care | Payer: 59 | Source: Ambulatory Visit | Attending: Family Medicine | Admitting: Family Medicine

## 2017-07-14 DIAGNOSIS — Z1231 Encounter for screening mammogram for malignant neoplasm of breast: Secondary | ICD-10-CM

## 2017-07-17 ENCOUNTER — Encounter (HOSPITAL_BASED_OUTPATIENT_CLINIC_OR_DEPARTMENT_OTHER): Payer: BLUE CROSS/BLUE SHIELD

## 2017-07-18 DIAGNOSIS — H16142 Punctate keratitis, left eye: Secondary | ICD-10-CM | POA: Diagnosis not present

## 2017-07-21 DIAGNOSIS — N939 Abnormal uterine and vaginal bleeding, unspecified: Secondary | ICD-10-CM | POA: Diagnosis not present

## 2017-07-21 DIAGNOSIS — N924 Excessive bleeding in the premenopausal period: Secondary | ICD-10-CM | POA: Diagnosis not present

## 2017-08-01 DIAGNOSIS — H524 Presbyopia: Secondary | ICD-10-CM | POA: Diagnosis not present

## 2017-08-12 ENCOUNTER — Encounter (HOSPITAL_BASED_OUTPATIENT_CLINIC_OR_DEPARTMENT_OTHER): Payer: 59

## 2017-08-17 ENCOUNTER — Ambulatory Visit (HOSPITAL_BASED_OUTPATIENT_CLINIC_OR_DEPARTMENT_OTHER): Payer: 59

## 2017-08-18 ENCOUNTER — Telehealth: Payer: 59 | Admitting: Family

## 2017-08-18 DIAGNOSIS — R05 Cough: Secondary | ICD-10-CM | POA: Diagnosis not present

## 2017-08-18 DIAGNOSIS — R059 Cough, unspecified: Secondary | ICD-10-CM

## 2017-08-18 MED ORDER — PREDNISONE 10 MG (21) PO TBPK: ORAL_TABLET | ORAL | 0 refills | Status: DC

## 2017-08-18 NOTE — Progress Notes (Signed)
We are sorry that you are not feeling well.  Here is how we plan to help!  Based on your presentation I believe you most likely have A cough due to a virus.  This is called viral bronchitis and is best treated by rest, plenty of fluids and control of the cough.  You may use Ibuprofen or Tylenol as directed to help your symptoms.     In addition you may use A non-prescription cough medication called Robitussin DAC. Take 2 teaspoons every 8 hours or Delsym: take 2 teaspoons every 12 hours.  Sterapred 10 mg dosepak that I have called into the pharmacy to help your cough and congestion  From your responses in the eVisit questionnaire you describe inflammation in the upper respiratory tract which is causing a significant cough.  This is commonly called Bronchitis and has four common causes:    Allergies  Viral Infections  Acid Reflux  Bacterial Infection Allergies, viruses and acid reflux are treated by controlling symptoms or eliminating the cause. An example might be a cough caused by taking certain blood pressure medications. You stop the cough by changing the medication. Another example might be a cough caused by acid reflux. Controlling the reflux helps control the cough.  USE OF BRONCHODILATOR ("RESCUE") INHALERS: There is a risk from using your bronchodilator too frequently.  The risk is that over-reliance on a medication which only relaxes the muscles surrounding the breathing tubes can reduce the effectiveness of medications prescribed to reduce swelling and congestion of the tubes themselves.  Although you feel brief relief from the bronchodilator inhaler, your asthma may actually be worsening with the tubes becoming more swollen and filled with mucus.  This can delay other crucial treatments, such as oral steroid medications. If you need to use a bronchodilator inhaler daily, several times per day, you should discuss this with your provider.  There are probably better treatments that could  be used to keep your asthma under control.     HOME CARE . Only take medications as instructed by your medical team. . Complete the entire course of an antibiotic. . Drink plenty of fluids and get plenty of rest. . Avoid close contacts especially the very young and the elderly . Cover your mouth if you cough or cough into your sleeve. . Always remember to wash your hands . A steam or ultrasonic humidifier can help congestion.   GET HELP RIGHT AWAY IF: . You develop worsening fever. . You become short of breath . You cough up blood. . Your symptoms persist after you have completed your treatment plan MAKE SURE YOU   Understand these instructions.  Will watch your condition.  Will get help right away if you are not doing well or get worse.  Your e-visit answers were reviewed by a board certified advanced clinical practitioner to complete your personal care plan.  Depending on the condition, your plan could have included both over the counter or prescription medications. If there is a problem please reply  once you have received a response from your provider. Your safety is important to us.  If you have drug allergies check your prescription carefully.    You can use MyChart to ask questions about today's visit, request a non-urgent call back, or ask for a work or school excuse for 24 hours related to this e-Visit. If it has been greater than 24 hours you will need to follow up with your provider, or enter a new e-Visit to address those  concerns. You will get an e-mail in the next two days asking about your experience.  I hope that your e-visit has been valuable and will speed your recovery. Thank you for using e-visits.

## 2017-08-20 ENCOUNTER — Ambulatory Visit: Payer: Self-pay

## 2017-09-02 ENCOUNTER — Ambulatory Visit (HOSPITAL_BASED_OUTPATIENT_CLINIC_OR_DEPARTMENT_OTHER): Payer: 59 | Attending: Internal Medicine | Admitting: Internal Medicine

## 2017-09-02 DIAGNOSIS — G4730 Sleep apnea, unspecified: Secondary | ICD-10-CM

## 2017-09-02 DIAGNOSIS — R0683 Snoring: Secondary | ICD-10-CM | POA: Diagnosis present

## 2017-09-02 DIAGNOSIS — G4710 Hypersomnia, unspecified: Secondary | ICD-10-CM | POA: Diagnosis present

## 2017-09-02 DIAGNOSIS — G4733 Obstructive sleep apnea (adult) (pediatric): Secondary | ICD-10-CM | POA: Insufficient documentation

## 2017-09-03 MED FILL — VENLAFAXINE HCL ER 75 MG CA: 75 | 90 days supply | Qty: 180 | Fill #1

## 2017-09-07 NOTE — Procedures (Signed)
    NAME: [name] DATE OF BIRTH:  05/31/1966 MEDICAL RECORD NUMBER [id number]  LOCATION: [facility]  PHYSICIAN: Deretha EmoryJames C Osborne  DATE OF STUDY: 09/02/2017  SLEEP STUDY TYPE: Out of Center Sleep Test                REFERRING PHYSICIAN: Deretha Emorysborne, James C, MD  INDICATION FOR STUDY: Excessive daytime sleepiness, witnessed apnea, snoring.   EPWORTH SLEEPINESS SCORE:  15 HEIGHT: 5\' 3"  (160 cm)  WEIGHT: 210 lb (95.3 kg)    Body mass index is 37.2 kg/m.  NECK SIZE: 14 in.  MEDICATIONS Patient self administered medications include: N/A. Medications administered during study include No sleep medicine administered.Marland Kitchen.  SLEEP STUDY TECHNIQUE A multi-channel overnight portable sleep study was performed. The channels recorded were: nasal and oral airflow, thoracic and abdominal respiratory movement, and oxygen saturation with a pulse oximetry. Snoring and body position were also monitored.  TECHNICIAN COMMENTS Comments added by Technician: N/A Comments added by Scorer: N/A  RECORDING SUMMARY The study was initiated at 10:52:57 PM and terminated at 8:07:33 AM. The total recorded time was 554.6 minutes. Time in bed was 554.6 minutes.  RESPIRATORY PARAMETERS The overall AHI was 26.8 per hour, with a central apnea index of 0.0 per hour. The sleep efficiency was 96.7 % and the patient was supine for 64.4%. The arousal index was 0.7 per hour.The oxygen nadir was 84% during sleep.  CARDIAC DATA Mean heart rate during sleep was 75.5 bpm.  IMPRESSIONS - Moderate Obstructive Sleep apnea(OSA)  DIAGNOSIS - Obstructive Sleep Apnea (327.23 [G47.33 ICD-10])  RECOMMENDATIONS - Auto adjusting CPAP with pressures of 4-16, heated humidity, mask of choice.  Deretha EmoryJames C Osborne Sleep specialist,  American Board of Internal Medicine  ELECTRONICALLY SIGNED ON:  09/07/2017, 7:54 PM [facility] PH: [phone]   FX: [phone] ACCREDITED BY THE  AMERICAN ACADEMY OF SLEEP MEDICINE

## 2017-09-28 DIAGNOSIS — G4733 Obstructive sleep apnea (adult) (pediatric): Secondary | ICD-10-CM | POA: Diagnosis not present

## 2017-10-29 DIAGNOSIS — G4733 Obstructive sleep apnea (adult) (pediatric): Secondary | ICD-10-CM | POA: Diagnosis not present

## 2017-11-06 MED FILL — LISINOPRIL 10 MG TABS: 10 | 30 days supply | Qty: 30 | Fill #0

## 2017-11-24 DIAGNOSIS — I10 Essential (primary) hypertension: Secondary | ICD-10-CM | POA: Diagnosis not present

## 2017-11-28 DIAGNOSIS — G4733 Obstructive sleep apnea (adult) (pediatric): Secondary | ICD-10-CM | POA: Diagnosis not present

## 2017-12-02 MED FILL — VENLAFAXINE HCL ER 75 MG CA: 75 | 90 days supply | Qty: 180 | Fill #2

## 2017-12-29 DIAGNOSIS — G4733 Obstructive sleep apnea (adult) (pediatric): Secondary | ICD-10-CM | POA: Diagnosis not present

## 2018-01-05 DIAGNOSIS — G4733 Obstructive sleep apnea (adult) (pediatric): Secondary | ICD-10-CM | POA: Diagnosis not present

## 2018-01-28 DIAGNOSIS — G4733 Obstructive sleep apnea (adult) (pediatric): Secondary | ICD-10-CM | POA: Diagnosis not present

## 2018-03-03 MED FILL — LISINOPRIL 10 MG TABLET: 10 | 30 days supply | Qty: 30 | Fill #1

## 2018-03-03 MED FILL — VENLAFAXINE HCL ER 75 MG CA: 75 | 30 days supply | Qty: 60 | Fill #3

## 2018-03-30 DIAGNOSIS — A630 Anogenital (venereal) warts: Secondary | ICD-10-CM | POA: Diagnosis not present

## 2018-03-30 DIAGNOSIS — R319 Hematuria, unspecified: Secondary | ICD-10-CM | POA: Diagnosis not present

## 2018-03-30 DIAGNOSIS — I10 Essential (primary) hypertension: Secondary | ICD-10-CM | POA: Diagnosis not present

## 2018-03-30 DIAGNOSIS — Z6836 Body mass index (BMI) 36.0-36.9, adult: Secondary | ICD-10-CM | POA: Diagnosis not present

## 2018-03-30 DIAGNOSIS — Z0000 Encounter for general adult medical examination without abnormal findings: Secondary | ICD-10-CM | POA: Diagnosis not present

## 2018-03-30 DIAGNOSIS — Z79899 Other long term (current) drug therapy: Secondary | ICD-10-CM | POA: Diagnosis not present

## 2018-03-30 DIAGNOSIS — F322 Major depressive disorder, single episode, severe without psychotic features: Secondary | ICD-10-CM | POA: Diagnosis not present

## 2018-03-30 DIAGNOSIS — J301 Allergic rhinitis due to pollen: Secondary | ICD-10-CM | POA: Diagnosis not present

## 2018-03-30 MED FILL — VENLAFAXINE HCL ER 75 MG CA: 75 | 90 days supply | Qty: 180 | Fill #0

## 2018-03-30 MED FILL — AMLODIPINE BESYLATE 5 MG TA: 5 | 30 days supply | Qty: 30 | Fill #0

## 2018-04-29 DIAGNOSIS — G4733 Obstructive sleep apnea (adult) (pediatric): Secondary | ICD-10-CM | POA: Diagnosis not present

## 2018-07-05 MED FILL — VENLAFAXINE HCL ER 75 MG CA: 75 | 90 days supply | Qty: 180 | Fill #1

## 2018-07-05 MED FILL — AMLODIPINE BESYLATE 5 MG TA: 5 | 30 days supply | Qty: 30 | Fill #1

## 2018-08-23 MED FILL — AMLODIPINE BESYLATE 5 MG TA: 5 | 30 days supply | Qty: 30 | Fill #2

## 2018-09-27 MED FILL — VENLAFAXINE HCL ER 75 MG CA: 75 | 90 days supply | Qty: 180 | Fill #2

## 2018-09-27 MED FILL — AMLODIPINE BESYLATE 5 MG TA: 5 | 90 days supply | Qty: 90 | Fill #3

## 2018-09-28 ENCOUNTER — Telehealth: Payer: 59 | Admitting: Family

## 2018-09-28 DIAGNOSIS — L237 Allergic contact dermatitis due to plants, except food: Secondary | ICD-10-CM

## 2018-09-28 MED ORDER — PREDNISONE 10 MG (21) PO TBPK: ORAL_TABLET | ORAL | 0 refills | Status: DC

## 2018-09-28 NOTE — Progress Notes (Signed)

## 2018-10-01 ENCOUNTER — Ambulatory Visit (INDEPENDENT_AMBULATORY_CARE_PROVIDER_SITE_OTHER): Payer: Self-pay | Admitting: Nurse Practitioner

## 2018-10-01 VITALS — BP 129/84 | HR 75 | Temp 98.6°F | Resp 18 | Wt 212.8 lb

## 2018-10-01 DIAGNOSIS — L259 Unspecified contact dermatitis, unspecified cause: Secondary | ICD-10-CM

## 2018-10-01 MED ORDER — CEPHALEXIN 500 MG PO CAPS: 500.0000 mg | ORAL_CAPSULE | Freq: Four times a day (QID) | ORAL | 0 refills | Status: AC

## 2018-10-01 MED ORDER — HYDROXYZINE HCL 25 MG PO TABS: 25.0000 mg | ORAL_TABLET | Freq: Three times a day (TID) | ORAL | 0 refills | Status: DC | PRN

## 2018-10-01 MED ORDER — CLOBETASOL PROPIONATE 0.05 % EX CREA: 1.0000 "application " | TOPICAL_CREAM | Freq: Two times a day (BID) | CUTANEOUS | 0 refills | Status: AC

## 2018-10-01 NOTE — Progress Notes (Signed)
Subjective:     [name] is a 53 y.o. female who presents for evaluation of a rash involving the bilateral upper extremities, face/chin, right eyelid and lower mid back. Rash started 4 days ago. Lesions are purulent, and raised with blistering in texture. Rash has changed over time. Rash is pruritic. Associated symptoms: none. Patient denies: abdominal pain, congestion, cough, nausea, sore throat and vomiting. Patient has had contacts with similar rash. Patient has had new exposures (soaps, lotions, laundry detergents, foods, medications, plants, insects or animals).  Patient informs she worked in her yard over the weekend and did come into contact with suspected poison sumac.  Patient informs she completed an e-visit about 3 days ago, but since that time rash has been worsening.  Rash has been oozing drainage with scabbing.  Patient informs she has also used benadryl and hydrocortisone without relief.  The patient informs she has also been using hot water on the affected areas.    The following portions of the patient's history were reviewed and updated as appropriate: allergies, current medications and past medical history.  Review of Systems Constitutional: negative Eyes: negative Ears, nose, mouth, throat, and face: negative Respiratory: negative Cardiovascular: negative Gastrointestinal: negative Integument/breast: positive for pruritus, rash, skin color change and skin lesion(s), negative for dryness and nipple discharge    Objective:    BP 129/84    Pulse 75    Temp 98.6 F (37 C) (Oral)    Resp 18    Wt 212 lb 12.8 oz (96.5 kg)    LMP 12/13/2011    SpO2 98%    BMI 37.70 kg/m  Physical Exam Vitals signs reviewed.  Constitutional:      General: She is not in acute distress. HENT:     Head: Normocephalic.     Right Ear: Tympanic membrane, ear canal and external ear normal.     Left Ear: Tympanic membrane, ear canal and external ear normal.     Nose: Nose normal.   Mouth/Throat:     Mouth: Mucous membranes are dry.     Pharynx: No oropharyngeal exudate or posterior oropharyngeal erythema.  Eyes:     General: Lids are normal. Lids are everted, no foreign bodies appreciated. No allergic shiner or visual field deficit.       Right eye: No foreign body or discharge.     Conjunctiva/sclera: Conjunctivae normal.     Pupils: Pupils are equal, round, and reactive to light.      Comments: Mild redness to right upper eyelid, no blistering at present  Neck:      Comments: Redness to mid neck.  No obvious blistering or rash present. Skin:    General: Skin is warm and dry.     Findings: Rash present. Rash is crusting and urticarial.          Comments: Large area of blistering and crusting to the right forearm.  Base of this rash is erythematous, rash is annular, but without congruence. Rash has impetigo appearance.  Warm to palpation. See Media tab. Mild vesicular rash to   Neurological:     Mental Status: She is alert.     Assessment:  Contact Dermatitis Cellulitis    Plan:   Exam findings, diagnosis etiology and medication use and indications reviewed with patient. Follow- Up and discharge instructions provided. No emergent/urgent issues found on exam.  Patient symptoms are most likely that of a poison ivy/sumac rash that have worsened.  Patient has developed cellulitis with a  rash at this time.  The rash is erythematous, and does have some draining per the patient, warm to palpation, but currently is not draining at this time.  Rash is also spreading.  I feel like the patient has exacerbated her symptoms with use of hot water.  I am going to treat the patient for a skin infection based on the current presentation, please refer to the media section of her chart.  I am also prescribing the patient hydroxyzine for itching, along with clobetasol cream to apply topically.  I did contact the pharmacy, and they did not have the clobetasol gel in stock.  Patient  will begin Keflex for 7 days for the possible bacterial infection.  I have informed the patient to also avoid hot water, to use cool compresses, and to purchase over-the-counter Aveeno colloidal oatmeal bath to help with her symptoms.  The patient is well-appearing, is in no acute distress; although appears uncomfortable due to the itching.  Patient's vitals are stable at this time.  Patient education was provided. Patient verbalized understanding of information provided and agrees with plan of care (POC), all questions answered. The patient is advised to call or return to clinic if condition does not see an improvement in symptoms, or to seek the care of the closest emergency department if condition worsens with the above plan.   1. Contact dermatitis, unspecified contact dermatitis type, unspecified trigger  - cephALEXin (KEFLEX) 500 MG capsule; Take 1 capsule (500 mg total) by mouth 4 (four) times daily for 7 days.  Dispense: 28 capsule; Refill: 0 - hydrOXYzine (ATARAX/VISTARIL) 25 MG tablet; Take 1 tablet (25 mg total) by mouth 3 (three) times daily as needed.  Dispense: 30 tablet; Refill: 0 - clobetasol cream (TEMOVATE) 0.05 %; Apply 1 application topically 2 (two) times daily for 14 days.  Dispense: 30 g; Refill: 0 -Take medications as prescribed. Continue the Prednisone previously prescribed. -Use OTC Aveeno Colloidal Oatmeal bath at least twice daily to the affected areas.   -Use lukewarm water with showers and bathing.  May also use Epsom salt soaks or baths until symptoms improve. -Avoid scratching the affected areas. -Cool compresses to the affected areas until symptoms improve. -Keep the areas covered if they begin to ooze or drain. -Strict hand hygiene. -Follow up with your regular doctor as needed.

## 2018-10-01 NOTE — Patient Instructions (Signed)
Contact Dermatitis  -Take medications as prescribed. Continue the Prednisone previously prescribed. -Use OTC Aveeno Colloidal Oatmeal bath at least twice daily to the affected areas.   -Use lukewarm water with showers and bathing.  May also use Epsom salt soaks or baths until symptoms improve. -Avoid scratching the affected areas. -Cool compresses to the affected areas until symptoms improve. -Keep the areas covered if they begin to ooze or drain. -Strict hand hygiene. -Follow up with your regular doctor as needed.   Dermatitis is redness, soreness, and swelling (inflammation) of the skin. Contact dermatitis is a reaction to certain substances that touch the skin. Many different substances can cause contact dermatitis. There are two types of contact dermatitis:  Irritant contact dermatitis. This type is caused by something that irritates your skin, such as having dry hands from washing them too often with soap. This type does not require previous exposure to the substance for a reaction to occur. This is the most common type.  Allergic contact dermatitis. This type is caused by a substance that you are allergic to, such as poison ivy. This type occurs when you have been exposed to the substance (allergen) and develop a sensitivity to it. Dermatitis may develop soon after your first exposure to the allergen, or it may not develop until the next time you are exposed and every time thereafter. What are the causes? Irritant contact dermatitis is most commonly caused by exposure to:  Makeup.  Soaps.  Detergents.  Bleaches.  Acids.  Metal salts, such as nickel. Allergic contact dermatitis is most commonly caused by exposure to:  Poisonous plants.  Chemicals.  Jewelry.  Latex.  Medicines.  Preservatives in products, such as clothing. What increases the risk? You are more likely to develop this condition if you have:  A job that exposes you to irritants or allergens.  Certain  medical conditions, such as asthma or eczema. What are the signs or symptoms? Symptoms of this condition may occur on your body anywhere the irritant has touched you or is touched by you.  Symptoms include: ? Dryness or flaking. ? Redness. ? Cracks. ? Itching. ? Pain or a burning feeling. ? Blisters. ? Drainage of small amounts of blood or clear fluid from skin cracks. With allergic contact dermatitis, there may also be swelling in areas such as the eyelids, mouth, or genitals. How is this diagnosed? This condition is diagnosed with a medical history and physical exam.  A patch skin test may be performed to help determine the cause.  If the condition is related to your job, you may need to see an occupational medicine specialist. How is this treated? This condition is treated by checking for the cause of the reaction and protecting your skin from further contact. Treatment may also include:  Steroid creams or ointments. Oral steroid medicines may be needed in more severe cases.  Antibiotic medicines or antibacterial ointments, if a skin infection is present.  Antihistamine lotion or an antihistamine taken by mouth to ease itching.  A bandage (dressing). Follow these instructions at home: Skin care  Moisturize your skin as needed.  Apply cool compresses to the affected areas.  Try applying baking soda paste to your skin. Stir water into baking soda until it reaches a paste-like consistency.  Do not scratch your skin, and avoid friction to the affected area.  Avoid the use of soaps, perfumes, and dyes. Medicines  Take or apply over-the-counter and prescription medicines only as told by your health care provider.  If you were prescribed an antibiotic medicine, take or apply the antibiotic as told by your health care provider. Do not stop using the antibiotic even if your condition improves. Bathing  Try taking a bath with: ? Epsom salts. Follow the instructions on the  packaging. You can get these at your local pharmacy or grocery store. ? Baking soda. Pour a small amount into the bath as directed by your health care provider. ? Colloidal oatmeal. Follow the instructions on the packaging. You can get this at your local pharmacy or grocery store.  Bathe less frequently, such as every other day.  Bathe in lukewarm water. Avoid using hot water. Bandage care  If you were given a bandage (dressing), change it as told by your health care provider.  Wash your hands with soap and water before and after you change your dressing. If soap and water are not available, use hand sanitizer. General instructions  Avoid the substance that caused your reaction. If you do not know what caused it, keep a journal to try to track what caused it. Write down: ? What you eat. ? What cosmetic products you use. ? What you drink. ? What you wear in the affected area. This includes jewelry.  Check the affected areas every day for signs of infection. Check for: ? More redness, swelling, or pain. ? More fluid or blood. ? Warmth. ? Pus or a bad smell.  Keep all follow-up visits as told by your health care provider. This is important. Contact a health care provider if:  Your condition does not improve with treatment.  Your condition gets worse.  You have signs of infection such as swelling, tenderness, redness, soreness, or warmth in the affected area.  You have a fever.  You have new symptoms. Get help right away if:  You have a severe headache, neck pain, or neck stiffness.  You vomit.  You feel very sleepy.  You notice red streaks coming from the affected area.  Your bone or joint underneath the affected area becomes painful after the skin has healed.  The affected area turns darker.  You have difficulty breathing. Summary  Dermatitis is redness, soreness, and swelling (inflammation) of the skin. Contact dermatitis is a reaction to certain substances that  touch the skin.  Symptoms of this condition may occur on your body anywhere the irritant has touched you or is touched by you.  This condition is treated by figuring out what caused the reaction and protecting your skin from further contact. Treatment may also include medicines and skin care.  Avoid the substance that caused your reaction. If you do not know what caused it, keep a journal to try to track what caused it.  Contact a health care provider if your condition gets worse or you have signs of infection such as swelling, tenderness, redness, soreness, or warmth in the affected area. This information is not intended to replace advice given to you by your health care provider. Make sure you discuss any questions you have with your health care provider. Document Released: 06/20/2000 Document Revised: 01/06/2018 Document Reviewed: 01/06/2018 Elsevier Interactive Patient Education  2019 [employer].

## 2018-10-05 DIAGNOSIS — L259 Unspecified contact dermatitis, unspecified cause: Secondary | ICD-10-CM | POA: Diagnosis not present

## 2018-10-05 DIAGNOSIS — L509 Urticaria, unspecified: Secondary | ICD-10-CM | POA: Diagnosis not present

## 2018-12-08 DIAGNOSIS — Z0100 Encounter for examination of eyes and vision without abnormal findings: Secondary | ICD-10-CM | POA: Diagnosis not present

## 2019-01-04 MED FILL — VENLAFAXINE HCL ER 75 MG CA: 75 | 90 days supply | Qty: 180 | Fill #3

## 2019-01-12 MED FILL — AMLODIPINE BESYLATE 5 MG TA: 5 | 90 days supply | Qty: 90 | Fill #4

## 2019-02-09 DIAGNOSIS — G4733 Obstructive sleep apnea (adult) (pediatric): Secondary | ICD-10-CM | POA: Diagnosis not present

## 2019-02-17 DIAGNOSIS — M2242 Chondromalacia patellae, left knee: Secondary | ICD-10-CM | POA: Diagnosis not present

## 2019-04-05 MED FILL — VENLAFAXINE HCL ER 75 MG CA: 75 | 90 days supply | Qty: 180 | Fill #0

## 2019-04-05 MED FILL — AMLODIPINE BESYLATE 5 MG TA: 5 | 90 days supply | Qty: 90 | Fill #0

## 2019-04-12 DIAGNOSIS — I10 Essential (primary) hypertension: Secondary | ICD-10-CM | POA: Diagnosis not present

## 2019-04-14 DIAGNOSIS — Z6836 Body mass index (BMI) 36.0-36.9, adult: Secondary | ICD-10-CM | POA: Diagnosis not present

## 2019-04-14 DIAGNOSIS — Z0000 Encounter for general adult medical examination without abnormal findings: Secondary | ICD-10-CM | POA: Diagnosis not present

## 2019-04-14 DIAGNOSIS — Z79899 Other long term (current) drug therapy: Secondary | ICD-10-CM | POA: Diagnosis not present

## 2019-04-14 DIAGNOSIS — J301 Allergic rhinitis due to pollen: Secondary | ICD-10-CM | POA: Diagnosis not present

## 2019-04-14 DIAGNOSIS — I10 Essential (primary) hypertension: Secondary | ICD-10-CM | POA: Diagnosis not present

## 2019-04-14 DIAGNOSIS — F322 Major depressive disorder, single episode, severe without psychotic features: Secondary | ICD-10-CM | POA: Diagnosis not present

## 2019-04-14 DIAGNOSIS — E782 Mixed hyperlipidemia: Secondary | ICD-10-CM | POA: Diagnosis not present

## 2019-04-14 DIAGNOSIS — E669 Obesity, unspecified: Secondary | ICD-10-CM | POA: Diagnosis not present

## 2019-04-14 DIAGNOSIS — A630 Anogenital (venereal) warts: Secondary | ICD-10-CM | POA: Diagnosis not present

## 2019-04-14 MED FILL — DEXMETHYLPHENIDATE 10 MG TA: 10 | 30 days supply | Qty: 60 | Fill #0

## 2019-06-06 MED FILL — AMLODIPINE BESYLATE 10 MG T: 10 | 90 days supply | Qty: 90 | Fill #0

## 2019-06-27 MED FILL — VENLAFAXINE HCL ER 75 MG CA: 75 | 90 days supply | Qty: 180 | Fill #1

## 2019-09-01 MED FILL — AMLODIPINE BESYLATE 10 MG T: 10 | 90 days supply | Qty: 90 | Fill #1

## 2019-09-26 ENCOUNTER — Other Ambulatory Visit (HOSPITAL_COMMUNITY): Payer: Self-pay | Admitting: Anesthesiology

## 2019-10-04 DIAGNOSIS — M25562 Pain in left knee: Secondary | ICD-10-CM | POA: Diagnosis not present

## 2019-10-04 DIAGNOSIS — M5416 Radiculopathy, lumbar region: Secondary | ICD-10-CM | POA: Diagnosis not present

## 2019-10-04 DIAGNOSIS — S83241A Other tear of medial meniscus, current injury, right knee, initial encounter: Secondary | ICD-10-CM | POA: Diagnosis not present

## 2019-10-04 MED FILL — predniSONE 10 MG TABS: 10 | 12 days supply | Qty: 48 | Fill #0

## 2019-10-06 ENCOUNTER — Other Ambulatory Visit (HOSPITAL_COMMUNITY): Payer: Self-pay | Admitting: Family Medicine

## 2019-10-06 MED FILL — VENLAFAXINE HCL ER 75 MG CA: 75 | 90 days supply | Qty: 180 | Fill #0

## 2019-10-26 MED FILL — DICLOFENAC EPOLAMINE 1.3 %: 1.3 | 30 days supply | Qty: 60 | Fill #0

## 2019-11-14 DIAGNOSIS — S90851A Superficial foreign body, right foot, initial encounter: Secondary | ICD-10-CM | POA: Diagnosis not present

## 2019-11-14 DIAGNOSIS — M2041 Other hammer toe(s) (acquired), right foot: Secondary | ICD-10-CM | POA: Diagnosis not present

## 2019-11-29 MED FILL — AMLODIPINE BESYLATE 10 MG T: 10 | 90 days supply | Qty: 90 | Fill #2

## 2019-12-07 ENCOUNTER — Encounter: Payer: Self-pay | Admitting: Podiatry

## 2019-12-07 ENCOUNTER — Ambulatory Visit: Payer: 59 | Admitting: Podiatry

## 2019-12-07 ENCOUNTER — Ambulatory Visit (INDEPENDENT_AMBULATORY_CARE_PROVIDER_SITE_OTHER): Payer: 59

## 2019-12-07 ENCOUNTER — Other Ambulatory Visit: Payer: Self-pay

## 2019-12-07 DIAGNOSIS — L989 Disorder of the skin and subcutaneous tissue, unspecified: Secondary | ICD-10-CM | POA: Diagnosis not present

## 2019-12-07 DIAGNOSIS — M2041 Other hammer toe(s) (acquired), right foot: Secondary | ICD-10-CM | POA: Diagnosis not present

## 2019-12-11 NOTE — Progress Notes (Signed)
   HPI: 54 y.o. female presenting today as a new patient for evaluation of a bone spur possibly to the right fourth toe.  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  Aggravated by walking and closed toed shoes.  She presents for further treatment and evaluation  No past medical history on file.   Physical Exam: General: The patient is alert and oriented x3 in no acute distress.  Dermatology: Skin is warm, dry and supple bilateral lower extremities. Negative for open lesions or macerations.  Hyperkeratotic callus noted to the lateral aspect of the fourth toe right foot  Vascular: Palpable pedal pulses bilaterally. No edema or erythema noted. Capillary refill within normal limits.  Neurological: Epicritic and protective threshold grossly intact bilaterally.   Musculoskeletal Exam: Range of motion within normal limits to all pedal and ankle joints bilateral. Muscle strength 5/5 in all groups bilateral.  Palpation of the PIPJ of the fourth toe right foot does indicate a hypertrophic head of the proximal phalanx which is causing and contributing extra pressure to the area  Assessment: 1.  Pressure callus fourth toe lateral aspect right foot at the level of the PIPJ   Plan of Care:  1. Patient evaluated. 2.  Excisional debridement of the callus tissue was performed using a surgical scalpel without incident or bleeding.  Salicylic acid applied. 3.  Recommend OTC corn and callus remover 4.  Recommend wide fitting shoes that do not increase pressure to the area 5.  At the moment we will simply treat this conservatively without pursuing surgery. 6.  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 Felecia Shelling, DPM    [address] [place], [place] [address]                Office [phone]  Fax [phone]

## 2020-01-02 DIAGNOSIS — H52203 Unspecified astigmatism, bilateral: Secondary | ICD-10-CM | POA: Diagnosis not present

## 2020-01-02 DIAGNOSIS — H5213 Myopia, bilateral: Secondary | ICD-10-CM | POA: Diagnosis not present

## 2020-01-05 MED FILL — VENLAFAXINE HCL ER 75 MG CA: 75 | 90 days supply | Qty: 180 | Fill #1

## 2020-02-28 MED FILL — AMLODIPINE BESYLATE 10 MG T: 10 | 90 days supply | Qty: 90 | Fill #3

## 2020-03-02 MED FILL — predniSONE 10 MG TABS: 10 | 11 days supply | Qty: 42 | Fill #0

## 2020-03-22 MED FILL — DEXMETHYLPHENIDATE 10 MG TA: 10 | 30 days supply | Qty: 60 | Fill #0

## 2020-04-06 MED FILL — VENLAFAXINE HCL ER 75 MG CA: 75 | 90 days supply | Qty: 180 | Fill #2

## 2020-04-07 MED FILL — DEXMETHYLPHENIDATE 10 MG TA: 10 | 30 days supply | Qty: 60 | Fill #0

## 2020-04-26 ENCOUNTER — Other Ambulatory Visit (HOSPITAL_COMMUNITY): Payer: Self-pay | Admitting: Family Medicine

## 2020-04-26 DIAGNOSIS — Z0000 Encounter for general adult medical examination without abnormal findings: Secondary | ICD-10-CM | POA: Diagnosis not present

## 2020-04-26 DIAGNOSIS — E782 Mixed hyperlipidemia: Secondary | ICD-10-CM | POA: Diagnosis not present

## 2020-04-26 DIAGNOSIS — A630 Anogenital (venereal) warts: Secondary | ICD-10-CM | POA: Diagnosis not present

## 2020-04-26 DIAGNOSIS — F322 Major depressive disorder, single episode, severe without psychotic features: Secondary | ICD-10-CM | POA: Diagnosis not present

## 2020-04-26 DIAGNOSIS — I10 Essential (primary) hypertension: Secondary | ICD-10-CM | POA: Diagnosis not present

## 2020-04-26 DIAGNOSIS — Z79899 Other long term (current) drug therapy: Secondary | ICD-10-CM | POA: Diagnosis not present

## 2020-04-26 DIAGNOSIS — Z713 Dietary counseling and surveillance: Secondary | ICD-10-CM | POA: Diagnosis not present

## 2020-04-26 DIAGNOSIS — E669 Obesity, unspecified: Secondary | ICD-10-CM | POA: Diagnosis not present

## 2020-04-26 DIAGNOSIS — J301 Allergic rhinitis due to pollen: Secondary | ICD-10-CM | POA: Diagnosis not present

## 2020-04-26 MED FILL — METOPROLOL SUCCINATE ER 50: 50 | 90 days supply | Qty: 90 | Fill #0

## 2020-05-02 ENCOUNTER — Other Ambulatory Visit: Payer: Self-pay | Admitting: Family Medicine

## 2020-05-02 DIAGNOSIS — Z1231 Encounter for screening mammogram for malignant neoplasm of breast: Secondary | ICD-10-CM

## 2020-05-15 ENCOUNTER — Other Ambulatory Visit (HOSPITAL_COMMUNITY): Payer: Self-pay | Admitting: Family Medicine

## 2020-05-15 MED FILL — METHOCARBAMOL 500 MG TABS: 500 | 10 days supply | Qty: 30 | Fill #0

## 2020-05-15 MED FILL — predniSONE 10 MG TABS: 10 | 5 days supply | Qty: 25 | Fill #0

## 2020-05-23 ENCOUNTER — Other Ambulatory Visit: Payer: Self-pay

## 2020-05-23 ENCOUNTER — Ambulatory Visit: Payer: PRIVATE HEALTH INSURANCE | Attending: Family Medicine

## 2020-05-23 DIAGNOSIS — R252 Cramp and spasm: Secondary | ICD-10-CM | POA: Insufficient documentation

## 2020-05-23 DIAGNOSIS — R293 Abnormal posture: Secondary | ICD-10-CM | POA: Insufficient documentation

## 2020-05-23 DIAGNOSIS — M542 Cervicalgia: Secondary | ICD-10-CM | POA: Insufficient documentation

## 2020-05-23 DIAGNOSIS — M546 Pain in thoracic spine: Secondary | ICD-10-CM | POA: Diagnosis present
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
## 2020-05-23 NOTE — Patient Instructions (Signed)
Access Code: [email or web address] URL: [email or web address] Date: 05/23/2020 Prepared by: Tresa Endo  Exercises Seated Cervical Flexion AROM - 3 x daily - 7 x weekly - 1 sets - 3 reps - 20 hold Seated Cervical Sidebending AROM - 3 x daily - 7 x weekly - 1 sets - 3 reps - 20 hold Seated Cervical Rotation AROM - 3 x daily - 7 x weekly - 1 sets - 3 reps - 20 hold Seated Correct Posture - 1 x daily - 7 x weekly - 3 sets - 10 reps Cervical Rotation Prone on Elbows - 3 x daily - 7 x weekly - 1 sets - 10 reps Seated Thoracic Flexion and Rotation with Arms Crossed - 2 x daily - 7 x weekly - 1 sets - 5 reps - 10 hold

## 2020-05-25 ENCOUNTER — Other Ambulatory Visit: Payer: Self-pay

## 2020-05-25 ENCOUNTER — Ambulatory Visit: Payer: PRIVATE HEALTH INSURANCE | Admitting: Physical Therapy

## 2020-05-25 ENCOUNTER — Encounter: Payer: Self-pay | Admitting: Physical Therapy

## 2020-05-25 DIAGNOSIS — R252 Cramp and spasm: Secondary | ICD-10-CM

## 2020-05-25 DIAGNOSIS — M542 Cervicalgia: Secondary | ICD-10-CM | POA: Diagnosis not present

## 2020-05-25 DIAGNOSIS — M546 Pain in thoracic spine: Secondary | ICD-10-CM

## 2020-05-25 DIAGNOSIS — R293 Abnormal posture: Secondary | ICD-10-CM

## 2020-05-25 NOTE — Therapy (Signed)
[facility] 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 No pertinent past medical history.  Past Surgical History:  Procedure Laterality Date  . ANKLE FRACTURE SURGERY    . CESAREAN SECTION    . FRACTURE SURGERY    . HAND SURGERY    . ROTATOR CUFF REPAIR    . TUBAL LIGATION      There were no vitals filed for this visit.   Subjective Assessment - 05/25/20 1018    Subjective More stiffness than pain, worse with certain movements    Pertinent History neck injury at work 05/10/20    Limitations Sitting    How long can you sit comfortably? at computer- gets tight across the shoulders    Diagnostic tests none    Patient Stated Goals reduce neck pain and stiffness, work without restrictions    Currently in Pain? Yes    Pain Score 4     Pain Location Neck    Pain Orientation Right;Left;Mid    Pain Descriptors / Indicators Tightness;Aching    Pain Onset 1 to 4 weeks ago    Aggravating Factors  end of work shift, sleep, turning head    Pain Relieving Factors Tylenol, ice    Effect of Pain on Daily Activities lifting restrictions at work 50lb              [facility] PT Assessment - 05/25/20 0001       AROM   Cervical - Left Rotation 50   end of session 65 deg                        [facility] Adult PT Treatment/Exercise - 05/25/20 0001      Exercises   Exercises Neck;Shoulder      Neck Exercises: Supine   Neck Retraction 5 reps    Cervical Rotation Both;5 reps    Cervical Rotation Limitations in retraction      Neck Exercises: Sidelying   Other Sidelying Exercise open books 1x5 each side with ribcage expansion breathing      Shoulder Exercises: Prone   Other Prone Exercises scapular retraction 1x10      Modalities   Modalities Moist Heat      Moist Heat Therapy   Number Minutes Moist Heat 10 Minutes    Moist Heat Location Cervical      Manual Therapy   Manual Therapy Joint mobilization;Soft tissue mobilization;Passive ROM    Joint Mobilization mid-cervical spine sideglides bil, upglides bil, downglides on Lt, thoracic PAs and UPAs on Rt Gr II/III, rib springing bil Gr II/III    Soft  tissue mobilization bil upper traps, cervical paraspinals, thoracic paraspinals    Passive ROM flexion with rotation in supine stretching with contract/relax            Trigger Point Dry Needling - 05/25/20 0001    Consent Given? Yes    Education Handout Provided Yes    Muscles Treated Head and Neck Upper trapezius    Other Dry Needling bil                  PT Short Term Goals - 05/23/20 1201      PT SHORT TERM GOAL #1   Title be independent in initial HEP    Time 4    Period Weeks    Status New    Target Date 06/20/20      PT SHORT TERM GOAL #2   Title report <or = to 3/10 neck/thoracic pain as the end of a work shift.    Time 4    Period Weeks    Status New    Target Date 06/20/20      PT SHORT TERM GOAL #3   Title demonstrate cervical A/ROM rotation to > or = to 60 degrees bilaterally to improve safety with driving    Time 4    Status New    Target Date 06/20/20             PT Long Term Goals - 05/23/20 1207      PT LONG TERM GOAL #1    Title be independent in advanced HEP    Time 8    Period Weeks    Status New    Target Date 07/18/20      PT LONG TERM GOAL #2   Title reduce FOTO to < or = to 38% limitation    Time 8    Period Weeks    Status New    Target Date 07/18/20      PT LONG TERM GOAL #3   Title report > or = to 75% reduction in neck and thoracic pain at the end of a work shift    Time 8    Period Weeks    Status New    Target Date 07/18/20      PT LONG TERM GOAL #4   Title demonstrate bil cervical A/ROM rotation to > or = to 70 degrees bilaterally to improve safety with driving    Time 8    Period Weeks    Status New    Target Date 07/18/20      PT LONG TERM GOAL #5   Title demonstrate neutral seated posture > or = to 75% of the time to reduce cervical strain with computer use    Time 8    Period Weeks    Status New    Target Date 07/18/20                 Plan - 05/25/20 1237    Clinical Impression Statement Pt arrives with stiffness vs pain Lt>Rt.  She presented with limited Lt rotation to 50 deg beginning of session which was a 10 degree improvement from evaluation.  She is doing her HEP.  PT performed DN and manual techniuqes and gained another 15 deg of rotation today, reaching Lt rot of 65 deg.  DN to Lt upper trap provided the most release and twitch response with good elongation of tissues.  Pt reported improved feeling of mobility and relaxation with techniques today.  Pt  was sore end of session in Lt upper trap so heat was applied.  Pt will continue to benefit from skilled PT along POC.    Rehab Potential Excellent    PT Frequency 2x / week    PT Duration 8 weeks    PT Treatment/Interventions ADLs/Self Care Home Management;Cryotherapy;Traction;Moist Heat;Therapeutic activities;Therapeutic exercise;Neuromuscular re-education;Patient/family education;Taping;Spinal Manipulations;Dry needling;Passive range of motion;Joint Manipulations    PT Next Visit Plan f/u on bil upper trap DN,  continue DN, cervical and thoracic mobs and STM, add scapular bands next time    PT Home Exercise Plan Access Code: [email or web address]    Consulted and Agree with Plan of Care Patient           Patient will benefit from skilled therapeutic intervention in order to improve the following deficits and impairments:     Visit Diagnosis: Cervicalgia  Pain in thoracic spine  Cramp and spasm  Abnormal posture     Problem List There are no problems to display for this patient.   Loistine Simas Shatiqua Heroux, PT 05/25/20 12:49 PM   [facility] [address], [address] [place], [place], [address] Phone: [phone]   Fax:  [phone]  Name: ALDONA BRYNER MRN: [id number] Date of Birth: 01-06-1966

## 2020-05-29 ENCOUNTER — Encounter: Payer: Self-pay | Admitting: Physical Therapy

## 2020-05-29 ENCOUNTER — Ambulatory Visit: Payer: PRIVATE HEALTH INSURANCE | Admitting: Physical Therapy

## 2020-05-29 ENCOUNTER — Other Ambulatory Visit: Payer: Self-pay

## 2020-05-29 DIAGNOSIS — M546 Pain in thoracic spine: Secondary | ICD-10-CM

## 2020-05-29 DIAGNOSIS — R252 Cramp and spasm: Secondary | ICD-10-CM

## 2020-05-29 DIAGNOSIS — R293 Abnormal posture: Secondary | ICD-10-CM

## 2020-05-29 DIAGNOSIS — M542 Cervicalgia: Secondary | ICD-10-CM

## 2020-05-29 NOTE — Therapy (Signed)
[facility] [address], [address] [place], [place], [address] Phone: [phone]   Fax:  [phone]  Physical Therapy Treatment  Patient Details  Name: [name] MRN: [id number] Date of Birth: [birth year] Referring Provider (PT): Lanell Persons, MD   Encounter Date: 05/29/2020   PT End of Session - 05/29/20 1104    Visit Number 3    Date for PT Re-Evaluation 07/18/20    Authorization Type Workers Comp: [email or web address]    Authorization - Visit Number 3    Authorization - Number of Visits 8    PT Start Time 1104    PT Stop Time 1153    PT Time Calculation (min) 49 min    Activity Tolerance Patient tolerated treatment well    Behavior During Therapy WFL for tasks assessed/performed           History reviewed. No pertinent past medical history.  Past Surgical History:  Procedure Laterality Date  . ANKLE FRACTURE SURGERY    . CESAREAN SECTION    . FRACTURE SURGERY    . HAND SURGERY    . ROTATOR CUFF REPAIR    . TUBAL LIGATION      There were no vitals filed for this visit.   Subjective Assessment - 05/29/20 1106    Subjective I have better range. Got sore from DN but it has subsided.  Tweaky on Lt side still.    Pertinent History neck injury at work 05/10/20    Limitations Sitting    How long can you sit comfortably? at computer- gets tight across the shoulders    Diagnostic tests none    Patient Stated Goals reduce neck pain and stiffness, work without restrictions    Currently in Pain? Yes    Pain Score 2     Pain Location Neck    Pain Orientation Left    Pain Descriptors / Indicators Tightness;Sore    Pain Type Acute pain    Pain Onset 1 to 4 weeks ago    Pain Frequency Intermittent    Aggravating Factors  end of work shift, sleep, turning head    Pain Relieving Factors Tyelonol, ice    Effect of Pain on Daily Activities lifting limited to 50lb at work               [facility] PT Assessment - 05/29/20 0001      AROM   Cervical - Right Rotation 70    Cervical - Left Rotation 60                         [facility] Adult PT Treatment/Exercise - 05/29/20 0001      Neck Exercises: Seated   Cervical Rotation Both;10 reps    Cervical Rotation Limitations in retraction    Other Seated Exercise levator and upper trap stretch with overpressure on Lt 1x30 each      Shoulder Exercises: Standing   Horizontal ABduction Strengthening;Both;10 reps;Theraband    Theraband Level (Shoulder Horizontal ABduction) Level 2 (Red)    Extension Left;10 reps;Theraband;Strengthening    Theraband Level (Shoulder Extension) Level 2 (Red)    Row Strengthening;Both;Theraband;10 reps    Theraband Level (Shoulder Row) Level 2 (Red)    Other Standing Exercises doorway stretch Lt only and bil 1x30 each, add head rotations in retraction      Moist Heat Therapy   Number Minutes Moist Heat 10 Minutes    Moist Heat Location  Cervical      Manual Therapy   Manual Therapy Soft tissue mobilization    Soft tissue mobilization Lt upper trap            Trigger Point Dry Needling - 05/29/20 0001    Consent Given? Yes    Education Handout Provided Previously provided    Muscles Treated Head and Neck Upper trapezius    Other Dry Needling Lt only, anterior and posterior approach, signif twitch anteriorly and posterolateral aspect of muscle today                  PT Short Term Goals - 05/23/20 1201      PT SHORT TERM GOAL #1   Title be independent in initial HEP    Time 4    Period Weeks    Status New    Target Date 06/20/20      PT SHORT TERM GOAL #2   Title report <or = to 3/10 neck/thoracic pain as the end of a work shift.    Time 4    Period Weeks    Status New    Target Date 06/20/20      PT SHORT TERM GOAL #3   Title demonstrate cervical A/ROM rotation to > or = to 60 degrees bilaterally to improve safety with driving    Time 4    Status New     Target Date 06/20/20             PT Long Term Goals - 05/23/20 1207      PT LONG TERM GOAL #1   Title be independent in advanced HEP    Time 8    Period Weeks    Status New    Target Date 07/18/20      PT LONG TERM GOAL #2   Title reduce FOTO to < or = to 38% limitation    Time 8    Period Weeks    Status New    Target Date 07/18/20      PT LONG TERM GOAL #3   Title report > or = to 75% reduction in neck and thoracic pain at the end of a work shift    Time 8    Period Weeks    Status New    Target Date 07/18/20      PT LONG TERM GOAL #4   Title demonstrate bil cervical A/ROM rotation to > or = to 70 degrees bilaterally to improve safety with driving    Time 8    Period Weeks    Status New    Target Date 07/18/20      PT LONG TERM GOAL #5   Title demonstrate neutral seated posture > or = to 75% of the time to reduce cervical strain with computer use    Time 8    Period Weeks    Status New    Target Date 07/18/20                 Plan - 05/29/20 1143    Clinical Impression Statement Pt continues to have elevated Lt shoulder girdle with upper trap spasm on Lt.  Her Lt rotation is slowly improving but PT notes Lt upper trap bunching and scapular elevation with Lt active neck rotation.  Signif twitch and elongation with DN again today to Lt upper trap.  PT attemped some theraband exercises but these made Pt feel increased tension in Lt upper trap.  PT showed upper  trap and levator stretches which were well tolerated.  Pt gained 10 deg Lt rotation with ROM, stretching and DN combo today.  Heat used for soreness.  Continue along POC.    PT Frequency 2x / week    PT Duration 8 weeks    PT Treatment/Interventions ADLs/Self Care Home Management;Cryotherapy;Traction;Moist Heat;Therapeutic activities;Therapeutic exercise;Neuromuscular re-education;Patient/family education;Taping;Spinal Manipulations;Dry needling;Passive range of motion;Joint Manipulations    PT Next  Visit Plan Lt upper trap release, cerivcal stretches/ROM/DN, scapular strength as tol, try supine band and progress HEP for supine tband    PT Home Exercise Plan Access Code: [email or web address]    Consulted and Agree with Plan of Care Patient           Patient will benefit from skilled therapeutic intervention in order to improve the following deficits and impairments:     Visit Diagnosis: Cervicalgia  Pain in thoracic spine  Cramp and spasm  Abnormal posture     Problem List There are no problems to display for this patient.   Loistine Simas Payge Eppes, PT 05/29/20 11:47 AM   [facility] [address], [address] [place], [place], [address] Phone: [phone]   Fax:  [phone]  Name: RONNIKA COLLETT MRN: [id number] Date of Birth: 29-Nov-1965

## 2020-06-01 DIAGNOSIS — G4733 Obstructive sleep apnea (adult) (pediatric): Secondary | ICD-10-CM | POA: Diagnosis not present

## 2020-06-07 ENCOUNTER — Other Ambulatory Visit: Payer: Self-pay

## 2020-06-07 ENCOUNTER — Ambulatory Visit: Payer: 59

## 2020-06-07 ENCOUNTER — Ambulatory Visit: Payer: PRIVATE HEALTH INSURANCE | Attending: Family Medicine

## 2020-06-07 DIAGNOSIS — M542 Cervicalgia: Secondary | ICD-10-CM | POA: Diagnosis present

## 2020-06-07 DIAGNOSIS — M546 Pain in thoracic spine: Secondary | ICD-10-CM | POA: Diagnosis present

## 2020-06-07 DIAGNOSIS — R252 Cramp and spasm: Secondary | ICD-10-CM | POA: Diagnosis present

## 2020-06-07 DIAGNOSIS — R293 Abnormal posture: Secondary | ICD-10-CM | POA: Diagnosis present

## 2020-06-07 NOTE — Therapy (Signed)
[facility] [address], [address] [place], [place], [address] Phone: [phone]   Fax:  [phone]  Physical Therapy Treatment  Patient Details  Name: [name] MRN: [id number] Date of Birth: 08/18/1965 Referring Provider (PT): Lanell Persons, MD   Encounter Date: 06/07/2020   PT End of Session - 06/07/20 1101    Visit Number 4    Date for PT Re-Evaluation 07/18/20    Authorization Type Workers Comp: [email or web address]    Authorization - Visit Number 4    Authorization - Number of Visits 8    PT Start Time 1021   dry needling   PT Stop Time 1100    PT Time Calculation (min) 39 min    Activity Tolerance Patient tolerated treatment well    Behavior During Therapy WFL for tasks assessed/performed           History reviewed. No pertinent past medical history.  Past Surgical History:  Procedure Laterality Date  . ANKLE FRACTURE SURGERY    . CESAREAN SECTION    . FRACTURE SURGERY    . HAND SURGERY    . ROTATOR CUFF REPAIR    . TUBAL LIGATION      There were no vitals filed for this visit.   Subjective Assessment - 06/07/20 1023    Subjective My pain varies a lot.  Worse with more activity.    Currently in Pain? Yes    Pain Location Neck    Pain Orientation Left    Pain Descriptors / Indicators Tightness;Sore    Pain Type Acute pain    Pain Onset 1 to 4 weeks ago    Pain Frequency Intermittent    Aggravating Factors  end of work shift, sleep, turning head    Pain Relieving Factors heat, Tylenol              [facility] PT Assessment - 06/07/20 0001      AROM   Cervical - Right Side Bend 40    Cervical - Left Side Bend 50    Cervical - Right Rotation 70    Cervical - Left Rotation 60                         [facility] Adult PT Treatment/Exercise - 06/07/20 0001      Manual Therapy   Manual Therapy Soft tissue mobilization    Soft tissue mobilization Lt  upper trap, levator, rhomboids      Neck Exercises: Stretches   Upper Trapezius Stretch 2 reps;20 seconds    Neck Stretch 2 reps;10 seconds            Trigger Point Dry Needling - 06/07/20 0001    Consent Given? Yes    Education Handout Provided Previously provided    Muscles Treated Head and Neck Upper trapezius;Levator scapulae;Cervical multifidi    Muscles Treated Upper Quadrant Rhomboids    Other Dry Needling Lt UT, levator, rhomboids, bil cervical multifidi, rhomboids    Upper Trapezius Response Twitch reponse elicited;Palpable increased muscle length    Levator Scapulae Response Twitch response elicited;Palpable increased muscle length    Cervical multifidi Response Palpable increased muscle length;Twitch reponse elicited    Rhomboids Response Twitch response elicited;Palpable increased muscle length                  PT Short Term Goals - 06/07/20 1024      PT SHORT TERM GOAL #1  Title be independent in initial HEP    Status Achieved      PT SHORT TERM GOAL #2   Title report <or = to 3/10 neck/thoracic pain as the end of a work shift.    Baseline up to 4/10    Time 4    Period Weeks    Status On-going             PT Long Term Goals - 05/23/20 1207      PT LONG TERM GOAL #1   Title be independent in advanced HEP    Time 8    Period Weeks    Status New    Target Date 07/18/20      PT LONG TERM GOAL #2   Title reduce FOTO to < or = to 38% limitation    Time 8    Period Weeks    Status New    Target Date 07/18/20      PT LONG TERM GOAL #3   Title report > or = to 75% reduction in neck and thoracic pain at the end of a work shift    Time 8    Period Weeks    Status New    Target Date 07/18/20      PT LONG TERM GOAL #4   Title demonstrate bil cervical A/ROM rotation to > or = to 70 degrees bilaterally to improve safety with driving    Time 8    Period Weeks    Status New    Target Date 07/18/20      PT LONG TERM GOAL #5   Title  demonstrate neutral seated posture > or = to 75% of the time to reduce cervical strain with computer use    Time 8    Period Weeks    Status New    Target Date 07/18/20                 Plan - 06/07/20 1101    Clinical Impression Statement Pt reports up to 4/10 Lt scapular and upper trap pain at the end of a work shift.  Pt with significant tension in the Lt rhomboids and upper traps today.  Pt responded well to dry needling with multiple twitch responses and improved tissue mobility after manual therapy today.  Cervical A/ROM has improved in all directions.  Pt will continue with HEP for strength and flexibility exercises and next appt is scheduled for 06/20/20.  Pt will continue to benefit from skilled PT to address neck/thoracic pain and muscle tension.    PT Frequency 2x / week    PT Duration 8 weeks    PT Treatment/Interventions ADLs/Self Care Home Management;Cryotherapy;Traction;Moist Heat;Therapeutic activities;Therapeutic exercise;Neuromuscular re-education;Patient/family education;Taping;Spinal Manipulations;Dry needling;Passive range of motion;Joint Manipulations    PT Next Visit Plan dry needling, scapular strength as tolerated.    PT Home Exercise Plan Access Code: [email or web address]    Consulted and Agree with Plan of Care Patient           Patient will benefit from skilled therapeutic intervention in order to improve the following deficits and impairments:  Decreased activity tolerance, Pain, Postural dysfunction, Impaired flexibility, Improper body mechanics, Decreased range of motion, Hypomobility, Increased muscle spasms  Visit Diagnosis: Cervicalgia  Pain in thoracic spine  Cramp and spasm  Abnormal posture     Problem List There are no problems to display for this patient.   [name], PT 06/07/20 11:04 AM  [facility] [address] [place], [place], [address] Phone: [phone]   Fax:   [phone]  Name: [name] MRN: [id number] Date of Birth: 04/18/1966

## 2020-06-11 ENCOUNTER — Other Ambulatory Visit: Payer: Self-pay

## 2020-06-11 ENCOUNTER — Ambulatory Visit
Admission: RE | Admit: 2020-06-11 | Discharge: 2020-06-11 | Disposition: A | Discharge status: Home / Self Care | Payer: 59 | Source: Ambulatory Visit | Attending: Family Medicine | Admitting: Family Medicine

## 2020-06-11 DIAGNOSIS — Z1231 Encounter for screening mammogram for malignant neoplasm of breast: Secondary | ICD-10-CM

## 2020-06-19 ENCOUNTER — Other Ambulatory Visit: Payer: Self-pay | Admitting: Family Medicine

## 2020-06-19 ENCOUNTER — Ambulatory Visit: Payer: Self-pay

## 2020-06-19 ENCOUNTER — Other Ambulatory Visit (HOSPITAL_COMMUNITY): Payer: Self-pay | Admitting: Physician Assistant

## 2020-06-19 ENCOUNTER — Other Ambulatory Visit: Payer: Self-pay

## 2020-06-19 DIAGNOSIS — M542 Cervicalgia: Secondary | ICD-10-CM

## 2020-06-19 DIAGNOSIS — M25551 Pain in right hip: Secondary | ICD-10-CM | POA: Diagnosis not present

## 2020-06-19 DIAGNOSIS — M25562 Pain in left knee: Secondary | ICD-10-CM | POA: Diagnosis not present

## 2020-06-19 DIAGNOSIS — M25462 Effusion, left knee: Secondary | ICD-10-CM | POA: Diagnosis not present

## 2020-06-19 MED FILL — CYCLOBENZAPRINE HCL 5 MG TA: 5 | 30 days supply | Qty: 30 | Fill #0

## 2020-06-19 MED FILL — predniSONE 10 MG TABS: 10 | 12 days supply | Qty: 48 | Fill #0

## 2020-06-20 ENCOUNTER — Ambulatory Visit: Payer: PRIVATE HEALTH INSURANCE | Admitting: Physical Therapy

## 2020-06-20 ENCOUNTER — Encounter: Payer: Self-pay | Admitting: Physical Therapy

## 2020-06-20 ENCOUNTER — Other Ambulatory Visit: Payer: Self-pay

## 2020-06-20 DIAGNOSIS — M542 Cervicalgia: Secondary | ICD-10-CM

## 2020-06-20 DIAGNOSIS — M546 Pain in thoracic spine: Secondary | ICD-10-CM

## 2020-06-20 DIAGNOSIS — R252 Cramp and spasm: Secondary | ICD-10-CM

## 2020-06-20 DIAGNOSIS — R293 Abnormal posture: Secondary | ICD-10-CM

## 2020-06-20 NOTE — Therapy (Signed)
[facility] 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 No pertinent past medical history.  Past Surgical History:  Procedure Laterality Date  . ANKLE FRACTURE SURGERY    . CESAREAN SECTION    . FRACTURE SURGERY    . HAND SURGERY    . ROTATOR CUFF REPAIR    . TUBAL LIGATION      There were no vitals filed for this visit.   Subjective Assessment - 06/20/20 0850    Subjective At end of work shifts over the weekend pain on Lt neck was 7-8/10.  I used heat and my HEP stretches and got it down to 4/10.  Today I am a 2/10    Pertinent History neck injury at work 05/10/20    Currently in Pain? Yes    Pain Score 2     Pain Location Neck    Pain Orientation Left    Pain Descriptors / Indicators Sharp;Tightness    Pain Type Acute pain    Pain Onset More than a month ago    Pain Frequency Intermittent    Aggravating Factors  end of work shift, looking up    Pain Relieving Factors heat/Tylenol    Effect of Pain on Daily Activities lifting limited to 50lb at work              [facility] PT Assessment - 06/20/20 0001      AROM   Cervical Flexion 45    Cervical Extension 45   central pain lower neck   Cervical - Right Side  Bend 40    Cervical - Left Side Bend 40   pinch pain on Lt   Cervical - Right Rotation 75    Cervical - Left Rotation 60                         [facility] Adult PT Treatment/Exercise - 06/20/20 0001      Therapeutic Activites    Therapeutic Activities Work Simulation    Work Simulation sliding patients up in bed using weighted box on table, PT cued neck and scapular retraction, TrA, exhale on effort      Exercises   Exercises Shoulder;Neck      Neck Exercises: Machines for Strengthening   UBE (Upper Arm Bike) L2 1x1 fwd/bwd PT present to monitor posture      Neck Exercises: Supine   Neck Retraction 5 reps;10 secs    Neck Retraction Limitations with scap retraction    Other Supine Exercise vertical soft foam roller lying pec stretch bil 1x30      Shoulder Exercises: Supine   Flexion AAROM;Both;10  reps    Flexion Limitations in neck retraction for upper back ext      Shoulder Exercises: Standing   Horizontal ABduction Strengthening;Both;15 reps;Theraband    Theraband Level (Shoulder Horizontal ABduction) Level 2 (Red)    External Rotation Strengthening;Both;Theraband    Theraband Level (Shoulder External Rotation) Level 2 (Red)    Internal Rotation Strengthening;Left;Theraband;15 reps    Theraband Level (Shoulder Internal Rotation) Level 2 (Red)    Row Strengthening;Both;20 reps;Theraband    Theraband Level (Shoulder Row) Level 4 (Blue)    Other Standing Exercises doorway stretch 1x30 sec                    PT Short Term Goals - 06/07/20 1024      PT SHORT TERM GOAL #1   Title be independent in initial HEP    Status Achieved      PT SHORT TERM GOAL #2   Title report <or = to 3/10 neck/thoracic pain as the end of a work shift.    Baseline up to 4/10    Time 4    Period Weeks    Status On-going             PT Long Term Goals - 05/23/20 1207      PT LONG TERM GOAL #1   Title be independent in advanced HEP    Time 8    Period Weeks     Status New    Target Date 07/18/20      PT LONG TERM GOAL #2   Title reduce FOTO to < or = to 38% limitation    Time 8    Period Weeks    Status New    Target Date 07/18/20      PT LONG TERM GOAL #3   Title report > or = to 75% reduction in neck and thoracic pain at the end of a work shift    Time 8    Period Weeks    Status New    Target Date 07/18/20      PT LONG TERM GOAL #4   Title demonstrate bil cervical A/ROM rotation to > or = to 70 degrees bilaterally to improve safety with driving    Time 8    Period Weeks    Status New    Target Date 07/18/20      PT LONG TERM GOAL #5   Title demonstrate neutral seated posture > or = to 75% of the time to reduce cervical strain with computer use    Time 8    Period Weeks    Status New    Target Date 07/18/20                 Plan - 06/20/20 1610    Clinical Impression Statement Pt continues to have pain up to 8/10 end of work shifts.  She is able to reduce pain to 4/10 using HEP stretches.  Pt's pain on arrival was 2/10.  Lt rotation and extension are limited with slight pain, which is much reduced compared to initial presentation.  PT progressed doorway stretch, cervical stabilization, and UE tband ther ex which were well tolerated today as compared to previous visits.  PT reviewed work simulation for sliding Pt's up in bed to emphasize scapular and neck retraction which Pt felt much better using.  Pt needs continued mobility, stability and strength to improve pain with work shifts.    PT Frequency 2x / week  PT Duration 8 weeks    PT Treatment/Interventions ADLs/Self Care Home Management;Cryotherapy;Traction;Moist Heat;Therapeutic activities;Therapeutic exercise;Neuromuscular re-education;Patient/family education;Taping;Spinal Manipulations;Dry needling;Passive range of motion;Joint Manipulations    PT Next Visit Plan dry needling prn, review tband and doorway stretch, scapular and UE strength    PT Home Exercise Plan  Access Code: [email or web address]    Consulted and Agree with Plan of Care Patient           Patient will benefit from skilled therapeutic intervention in order to improve the following deficits and impairments:     Visit Diagnosis: Cervicalgia  Pain in thoracic spine  Cramp and spasm  Abnormal posture     Problem List There are no problems to display for this patient.   Loistine Simas Tyjae Shvartsman, PT 06/20/20 9:30 AM   [facility] [address], [address] [place], [place], [address] Phone: [phone]   Fax:  [phone]  Name: [name] MRN: [id number] Date of Birth: 06/21/1966

## 2020-06-25 ENCOUNTER — Ambulatory Visit: Payer: PRIVATE HEALTH INSURANCE | Admitting: Physical Therapy

## 2020-06-26 ENCOUNTER — Other Ambulatory Visit: Payer: Self-pay

## 2020-06-26 ENCOUNTER — Ambulatory Visit: Payer: PRIVATE HEALTH INSURANCE

## 2020-06-26 DIAGNOSIS — M542 Cervicalgia: Secondary | ICD-10-CM | POA: Diagnosis not present

## 2020-06-26 DIAGNOSIS — R293 Abnormal posture: Secondary | ICD-10-CM

## 2020-06-26 DIAGNOSIS — R252 Cramp and spasm: Secondary | ICD-10-CM

## 2020-06-26 DIAGNOSIS — M546 Pain in thoracic spine: Secondary | ICD-10-CM

## 2020-06-26 NOTE — Therapy (Signed)
[facility] 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 No pertinent past medical history.  Past Surgical History:  Procedure Laterality Date  . ANKLE FRACTURE SURGERY    . CESAREAN SECTION    . FRACTURE SURGERY    . HAND SURGERY    . ROTATOR CUFF REPAIR    . TUBAL LIGATION      There were no vitals filed for this visit.   Subjective Assessment - 06/26/20 1235    Subjective I worked 3, 12 hour shifts and I just got off work.  My neck pain is only 1/10 today.  I feel 85-90% overall improvement.  This depends on how hard my work shift is.    Currently in Pain? Yes    Pain Score 1     Pain Location Neck    Pain Orientation Left    Pain Descriptors / Indicators Sharp;Tightness    Pain Type Acute pain    Pain Onset More than a month ago    Aggravating Factors  end of work shift, heavy activity    Pain Relieving Factors Heat/tylenol                             [facility] Adult PT Treatment/Exercise - 06/26/20 0001      Neck Exercises: Machines for Strengthening   UBE (Upper Arm Bike) L2 2x2 fwd/bwd PT present to monitor posture      Neck Exercises: Supine   Other Supine Exercise vertical soft foam roller  lying pec stretch bil 1x30      Shoulder Exercises: Supine   Horizontal ABduction Strengthening;Both;20 reps;Theraband    Theraband Level (Shoulder Horizontal ABduction) Level 2 (Red)    Horizontal ABduction Limitations seated and supine on foam roll    External Rotation Strengthening;Both;20 reps;Theraband    Theraband Level (Shoulder External Rotation) Level 2 (Red)    External Rotation Limitations seated and supine on foam roll      Shoulder Exercises: Standing   Extension Left;10 reps;Theraband;Strengthening    Theraband Level (Shoulder Extension) Level 2 (Red)    Row Strengthening;Both;20 reps;Theraband    Theraband Level (Shoulder Row) Level 2 (Red)                    PT Short Term Goals - 06/26/20 1237      PT SHORT TERM GOAL #3   Title demonstrate cervical A/ROM rotation to > or = to 60 degrees bilaterally to improve safety with driving    Status Achieved             PT Long Term Goals -  05/23/20 1207      PT LONG TERM GOAL #1   Title be independent in advanced HEP    Time 8    Period Weeks    Status New    Target Date 07/18/20      PT LONG TERM GOAL #2   Title reduce FOTO to < or = to 38% limitation    Time 8    Period Weeks    Status New    Target Date 07/18/20      PT LONG TERM GOAL #3   Title report > or = to 75% reduction in neck and thoracic pain at the end of a work shift    Time 8    Period Weeks    Status New    Target Date 07/18/20      PT LONG TERM GOAL #4   Title demonstrate bil cervical A/ROM rotation to > or = to 70 degrees bilaterally to improve safety with driving    Time 8    Period Weeks    Status New    Target Date 07/18/20      PT LONG TERM GOAL #5   Title demonstrate neutral seated posture > or = to 75% of the time to reduce cervical strain with computer use    Time 8    Period Weeks    Status New    Target Date 07/18/20                 Plan - 06/26/20 1305    Clinical Impression Statement Pt arrived  today with 1/10 neck pain at the end of her work shift and after 3 consecutive days of work.  This is a great improvement in symptoms.  Pt reports 85% overall improvement.  Pt reports that she is adjusting her posture and alignment with work tasks and this is helping. PT advanced HEP to include postural strength exercises and PT showed how to do on a foam roll to include her core.  Pt required tactile cues to reduce scapular elevation with band exercises.  Pt will return in 1-2 weeks and will continue with HEP in the mean time.  Pt will benefit from skilled PT to address thoracic and neck pain s/p work injury.    PT Frequency 2x / week    PT Duration 8 weeks    PT Treatment/Interventions ADLs/Self Care Home Management;Cryotherapy;Traction;Moist Heat;Therapeutic activities;Therapeutic exercise;Neuromuscular re-education;Patient/family education;Taping;Spinal Manipulations;Dry needling;Passive range of motion;Joint Manipulations    PT Next Visit Plan review HEP, manual therapy as needed, thoracic and cervical A/ROM    PT Home Exercise Plan Access Code: [email or web address]    Consulted and Agree with Plan of Care Patient           Patient will benefit from skilled therapeutic intervention in order to improve the following deficits and impairments:  Decreased activity tolerance,Pain,Postural dysfunction,Impaired flexibility,Improper body mechanics,Decreased range of motion,Hypomobility,Increased muscle spasms  Visit Diagnosis: Cervicalgia  Pain in thoracic spine  Cramp and spasm  Abnormal posture     Problem List There are no problems to display for this patient.   [name], PT 06/26/20 1:12 PM  [facility] [address], [address] [place], [place], [address] Phone: [phone]   Fax:  [phone]  Name: [name] MRN: [id number] Date of Birth: [birth year]

## 2020-07-11 ENCOUNTER — Ambulatory Visit: Payer: PRIVATE HEALTH INSURANCE | Attending: Family Medicine

## 2020-07-11 ENCOUNTER — Other Ambulatory Visit: Payer: Self-pay

## 2020-07-11 DIAGNOSIS — R252 Cramp and spasm: Secondary | ICD-10-CM | POA: Insufficient documentation

## 2020-07-11 DIAGNOSIS — M546 Pain in thoracic spine: Secondary | ICD-10-CM | POA: Diagnosis present

## 2020-07-11 DIAGNOSIS — R293 Abnormal posture: Secondary | ICD-10-CM | POA: Diagnosis present

## 2020-07-11 DIAGNOSIS — M542 Cervicalgia: Secondary | ICD-10-CM | POA: Diagnosis present

## 2020-07-11 NOTE — Therapy (Addendum)
[facility] 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 No pertinent past medical history.  Past Surgical History:  Procedure Laterality Date  . ANKLE FRACTURE SURGERY    . CESAREAN SECTION    . FRACTURE SURGERY    . HAND SURGERY    . ROTATOR CUFF REPAIR    . TUBAL LIGATION      There were no vitals filed for this visit.   Subjective Assessment - 07/11/20 1621    Subjective I am still feeling really good.  I am watching my body mechanics when I move a patient.  85-90% overall improvement.    Patient Stated Goals reduce neck pain and stiffness, work without restrictions    Currently in Pain? No/denies    Pain Score --   max of 3-4/10 with work             [facility] PT Assessment - 07/11/20 0001      AROM   Cervical - Right Rotation 75    Cervical - Left Rotation 70                         [facility] Adult PT Treatment/Exercise - 07/11/20 0001      Neck Exercises: Machines for Strengthening   UBE (Upper Arm Bike) L2 3/3 fwd/bwd PT present to monitor posture      Neck Exercises: Supine   Other Supine Exercise vertical soft foam roller lying pec stretch bil 1x30      Shoulder Exercises: Supine    Horizontal ABduction Strengthening;Both;20 reps;Theraband    Theraband Level (Shoulder Horizontal ABduction) Level 2 (Red)    External Rotation Strengthening;Both;20 reps;Theraband    Theraband Level (Shoulder External Rotation) Level 2 (Red)    External Rotation Limitations supine on foam roll      Shoulder Exercises: Standing   Extension Strengthening;Both;20 reps;Weights    Extension Weight (lbs) 20#    Row Strengthening;Both;20 reps;Weights    Row Weight (lbs) 20#    Other Standing Exercises lat pull down: 25# 2x10                    PT Short Term Goals - 06/26/20 1237      PT SHORT TERM GOAL #3   Title demonstrate cervical A/ROM rotation to > or = to 60 degrees bilaterally to improve safety with driving    Status Achieved             PT Long Term Goals - 07/11/20 1622      PT LONG TERM GOAL #3   Title report > or = to 75% reduction in  neck and thoracic pain at the end of a work shift    Status Achieved      PT LONG TERM GOAL #5   Title demonstrate neutral seated posture > or = to 75% of the time to reduce cervical strain with computer use    Status Achieved                 Plan - 07/11/20 1639    Clinical Impression Statement I still feel 85-90% overall improvement since the start of care.  Pt has worked and reports a max of 3-4/10 neck pain at worst.   Pt reports that she is adjusting her posture and alignment with work tasks and this is helping.  Pt didn't require any cueing for scapular position today. PT showed pt exercises that she can perform at the gym as she has joined [employer] and will be starting to go there.  Lt cervical A/ROM is improved to 70 degrees today.  Pt will attend 1 session next week for probable D/C.    PT Frequency 2x / week    PT Duration 8 weeks    PT Treatment/Interventions ADLs/Self Care Home Management;Cryotherapy;Traction;Moist Heat;Therapeutic activities;Therapeutic exercise;Neuromuscular re-education;Patient/family  education;Taping;Spinal Manipulations;Dry needling;Passive range of motion;Joint Manipulations    PT Next Visit Plan 1 more session.  Measure cervical A/ROM and do FOTO    PT Home Exercise Plan Access Code: [email or web address]    Consulted and Agree with Plan of Care Patient           Patient will benefit from skilled therapeutic intervention in order to improve the following deficits and impairments:  Decreased activity tolerance,Pain,Postural dysfunction,Impaired flexibility,Improper body mechanics,Decreased range of motion,Hypomobility,Increased muscle spasms  Visit Diagnosis: Cervicalgia  Pain in thoracic spine  Cramp and spasm  Abnormal posture     Problem List There are no problems to display for this patient.    Sigurd Sos, PT 07/11/20 4:47 PM   PHYSICAL THERAPY DISCHARGE SUMMARY  Visits from Start of Care: 7  Current functional level related to goals / functional outcomes: Pt missed re-evaluation appointment but PT spoke with her over the phone.  She reports readiness to discharge.  She reports 95% improvement overall and has very low-grade pain at the end of work shifts if any.  She has learned and demos proper body mechanics and stabilizer activation during work simulation tasks which should help prevent future flare up.  Verbal review of goals over the phone revealed Pt has met all LTGs.  FOTO score has reduced from 47% to 26% (goal 38%), meeting LTG and demo'ing less limitation by pain.  Pt has a gym membership and plans to carry over HEP and ongoing strength focus.  She is ready to d/c at this time.     Remaining deficits: See above   Education / Equipment: HEP and gym membership Plan: Patient agrees to discharge.  Patient goals were met. Patient is being discharged due to meeting the stated rehab goals.  ?????        Baruch Merl, PT 07/17/20 11:29 AM   [facility] [address] [place], [place], [address] Phone: [phone]   Fax:  [phone]  Name: [name] MRN: [id number] Date of Birth: Jul 18, 1965

## 2020-07-17 ENCOUNTER — Ambulatory Visit: Payer: PRIVATE HEALTH INSURANCE | Admitting: Physical Therapy

## 2020-07-24 MED FILL — METOPROLOL SUCCINATE ER 50: 50 | 90 days supply | Qty: 90 | Fill #1

## 2020-08-06 DIAGNOSIS — M222X2 Patellofemoral disorders, left knee: Secondary | ICD-10-CM | POA: Diagnosis not present

## 2020-08-06 DIAGNOSIS — M1712 Unilateral primary osteoarthritis, left knee: Secondary | ICD-10-CM | POA: Diagnosis not present

## 2020-08-06 DIAGNOSIS — M25561 Pain in right knee: Secondary | ICD-10-CM | POA: Diagnosis not present

## 2020-08-06 DIAGNOSIS — M222X1 Patellofemoral disorders, right knee: Secondary | ICD-10-CM | POA: Diagnosis not present

## 2020-08-06 DIAGNOSIS — M1711 Unilateral primary osteoarthritis, right knee: Secondary | ICD-10-CM | POA: Diagnosis not present

## 2020-08-06 DIAGNOSIS — M25761 Osteophyte, right knee: Secondary | ICD-10-CM | POA: Diagnosis not present

## 2020-08-06 DIAGNOSIS — M25762 Osteophyte, left knee: Secondary | ICD-10-CM | POA: Diagnosis not present

## 2020-08-08 DIAGNOSIS — M1712 Unilateral primary osteoarthritis, left knee: Secondary | ICD-10-CM | POA: Diagnosis not present

## 2020-08-14 DIAGNOSIS — M25562 Pain in left knee: Secondary | ICD-10-CM | POA: Diagnosis not present

## 2020-08-14 MED FILL — DICLOFENAC EPOLAMINE 1.3 %: 1.3 | 30 days supply | Qty: 60 | Fill #1

## 2020-08-20 DIAGNOSIS — S83242A Other tear of medial meniscus, current injury, left knee, initial encounter: Secondary | ICD-10-CM | POA: Diagnosis not present

## 2020-08-21 ENCOUNTER — Encounter (HOSPITAL_BASED_OUTPATIENT_CLINIC_OR_DEPARTMENT_OTHER): Payer: Self-pay | Admitting: Orthopedic Surgery

## 2020-08-21 DIAGNOSIS — Z720 Tobacco use: Secondary | ICD-10-CM

## 2020-08-21 DIAGNOSIS — R400 Somnolence: Secondary | ICD-10-CM | POA: Insufficient documentation

## 2020-08-21 DIAGNOSIS — S83242A Other tear of medial meniscus, current injury, left knee, initial encounter: Secondary | ICD-10-CM

## 2020-08-21 DIAGNOSIS — R319 Hematuria, unspecified: Secondary | ICD-10-CM

## 2020-08-21 DIAGNOSIS — A630 Anogenital (venereal) warts: Secondary | ICD-10-CM

## 2020-08-21 DIAGNOSIS — F329 Major depressive disorder, single episode, unspecified: Secondary | ICD-10-CM

## 2020-08-21 DIAGNOSIS — E782 Mixed hyperlipidemia: Secondary | ICD-10-CM

## 2020-08-21 DIAGNOSIS — J301 Allergic rhinitis due to pollen: Secondary | ICD-10-CM | POA: Insufficient documentation

## 2020-08-21 DIAGNOSIS — E669 Obesity, unspecified: Secondary | ICD-10-CM

## 2020-08-21 DIAGNOSIS — I10 Essential (primary) hypertension: Secondary | ICD-10-CM

## 2020-08-21 HISTORY — DX: Obesity, unspecified: E66.9

## 2020-08-21 HISTORY — DX: Somnolence: R40.0

## 2020-08-21 HISTORY — DX: Hematuria, unspecified: R31.9

## 2020-08-21 HISTORY — DX: Major depressive disorder, single episode, unspecified: F32.9

## 2020-08-21 HISTORY — DX: Tobacco use: Z72.0

## 2020-08-21 HISTORY — DX: Mixed hyperlipidemia: E78.2

## 2020-08-21 HISTORY — DX: Anogenital (venereal) warts: A63.0

## 2020-08-21 HISTORY — DX: Allergic rhinitis due to pollen: J30.1

## 2020-08-21 HISTORY — DX: Essential (primary) hypertension: I10

## 2020-08-21 HISTORY — DX: Other tear of medial meniscus, current injury, left knee, initial encounter: S83.242A

## 2020-08-21 NOTE — H&P (Signed)
[name] is an 55 y.o. female.   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  She failed antiinflammatories and an intraarticular cortisone injection.  MRI left knee showed a medial mensicus tear with mild to moderate arthritis.    Past Medical History:  Diagnosis Date  . Acute medial meniscus tear of left knee 08/21/2020  . Allergic rhinitis due to pollen 08/21/2020  . Benign hypertension 08/21/2020  . Genital warts 08/21/2020  . Hematuria 08/21/2020  . Major depression 08/21/2020  . Mixed hyperlipidemia 08/21/2020  . Obesity 08/21/2020  . Somnolence 08/21/2020  . Tobacco abuse 08/21/2020    Past Surgical History:  Procedure Laterality Date  . ANKLE FRACTURE SURGERY    . CESAREAN SECTION    . FRACTURE SURGERY    . HAND SURGERY    . ROTATOR CUFF REPAIR    . TUBAL LIGATION      History reviewed. No pertinent family history. Social History:  reports that she has never smoked. She has never used smokeless tobacco. She reports that she does not drink alcohol and does not use drugs.  Allergies:  Allergies  Allergen Reactions  . Sulfa Antibiotics Anaphylaxis  . Codeine Itching  . Lisinopril     Other reaction(s): cough  . Penicillins Nausea Only  . Erythromycin Rash    No medications prior to admission.    No results found for this or any previous visit (from the past 48 hour(s)). No results found.  Review of Systems  Constitutional: Positive for activity change.  HENT: Negative.   Eyes: Negative.   Respiratory: Negative.   Cardiovascular: Negative.   Gastrointestinal: Negative.   Endocrine: Negative.   Genitourinary: Negative.   Musculoskeletal: Positive for arthralgias, back pain, gait problem, joint swelling and myalgias.  Skin: Negative.   Allergic/Immunologic: Negative.   Hematological: Negative.   Psychiatric/Behavioral: Negative.     Height 5' 3.5" (1.613 m), weight  97.5 kg, last menstrual period 12/13/2011. Physical Exam Constitutional:      Appearance: Normal appearance.  HENT:     Head: Normocephalic and atraumatic.     Right Ear: External ear normal.     Left Ear: External ear normal.     Nose: Nose normal.     Mouth/Throat:     Mouth: Mucous membranes are moist.     Pharynx: Oropharynx is clear.  Eyes:     Conjunctiva/sclera: Conjunctivae normal.  Cardiovascular:     Rate and Rhythm: Normal rate.     Pulses: Normal pulses.  Pulmonary:     Effort: Pulmonary effort is normal.  Abdominal:     Palpations: Abdomen is soft.  Genitourinary:    Comments: Not pertinent to current symptomatology therefore not examined. Musculoskeletal:     Cervical back: Neck supple.     Comments: She is independently ambulatory with a significantly antalgic gait.  Left knee has a moderate effusion.  Range of motion -5 to 90 degrees.  2+ crepitus.  2+ synovitis.  Medial joint line tenderness.    Skin:    General: Skin is warm and dry.     Capillary Refill: Capillary refill takes less than 2 seconds.  Neurological:     General: No focal deficit present.     Mental Status: She is alert.  Psychiatric:        Mood and Affect: Mood normal.        Behavior:  Behavior normal.      Assessment Principal Problem:   Acute medial meniscus tear of left knee Active Problems:   Benign hypertension   Major depression   Mixed hyperlipidemia   Obesity   Tobacco abuse   Plan Left knee arthroscopy for partial medial meniscectomy.  The risks, benefits, and possible complications of the procedure were discussed in detail with the patient.  The patient is without question.  Lashaunda Schild J Najat Olazabal, PA-C 08/21/2020, 7:50 PM

## 2020-08-28 ENCOUNTER — Encounter (HOSPITAL_BASED_OUTPATIENT_CLINIC_OR_DEPARTMENT_OTHER): Payer: Self-pay | Admitting: Orthopedic Surgery

## 2020-08-28 ENCOUNTER — Other Ambulatory Visit: Payer: Self-pay

## 2020-08-29 ENCOUNTER — Other Ambulatory Visit: Payer: Self-pay

## 2020-08-29 ENCOUNTER — Ambulatory Visit (HOSPITAL_COMMUNITY)
Admission: RE | Admit: 2020-08-29 | Discharge: 2020-08-29 | Disposition: A | Discharge status: Home / Self Care | Payer: 59 | Attending: Orthopedic Surgery | Admitting: Orthopedic Surgery

## 2020-08-29 DIAGNOSIS — I10 Essential (primary) hypertension: Secondary | ICD-10-CM | POA: Diagnosis not present

## 2020-08-29 NOTE — Progress Notes (Signed)

## 2020-08-31 ENCOUNTER — Other Ambulatory Visit (HOSPITAL_COMMUNITY)
Admission: RE | Admit: 2020-08-31 | Discharge: 2020-08-31 | Disposition: A | Discharge status: Home / Self Care | Payer: 59 | Source: Ambulatory Visit | Attending: Orthopedic Surgery | Admitting: Orthopedic Surgery

## 2020-08-31 DIAGNOSIS — Z20822 Contact with and (suspected) exposure to covid-19: Secondary | ICD-10-CM | POA: Diagnosis not present

## 2020-08-31 DIAGNOSIS — Z01812 Encounter for preprocedural laboratory examination: Secondary | ICD-10-CM | POA: Diagnosis not present

## 2020-08-31 LAB — SARS CORONAVIRUS 2 (TAT 6-24 HRS): SARS Coronavirus 2: NEGATIVE

## 2020-09-03 NOTE — Anesthesia Preprocedure Evaluation (Addendum)
Anesthesia Evaluation  Patient identified by MRN, date of birth, ID band Patient awake    Reviewed: Allergy & Precautions, NPO status , Patient's Chart, lab work & pertinent test results  Airway Mallampati: II  TM Distance: >3 FB Neck ROM: Full    Dental no notable dental hx. (+) Teeth Intact, Dental Advisory Given   Pulmonary sleep apnea and Continuous Positive Airway Pressure Ventilation ,    Pulmonary exam normal breath sounds clear to auscultation       Cardiovascular hypertension, Pt. on medications Normal cardiovascular exam Rhythm:Regular Rate:Normal     Neuro/Psych PSYCHIATRIC DISORDERS Depression negative neurological ROS     GI/Hepatic negative GI ROS, Neg liver ROS,   Endo/Other  negative endocrine ROS  Renal/GU negative Renal ROS     Musculoskeletal negative musculoskeletal ROS (+)   Abdominal (+) + obese,   Peds  Hematology negative hematology ROS (+)   Anesthesia Other Findings All: see list  Reproductive/Obstetrics                            Anesthesia Physical Anesthesia Plan  ASA: II  Anesthesia Plan: General   Post-op Pain Management:    Induction: Intravenous  PONV Risk Score and Plan: 4 or greater and Treatment may vary due to age or medical condition, Ondansetron, Dexamethasone and Midazolam  Airway Management Planned: LMA  Additional Equipment: None  Intra-op Plan:   Post-operative Plan: Extubation in OR  Informed Consent: I have reviewed the patients History and Physical, chart, labs and discussed the procedure including the risks, benefits and alternatives for the proposed anesthesia with the patient or authorized representative who has indicated his/her understanding and acceptance.     Dental advisory given  Plan Discussed with: CRNA and Anesthesiologist  Anesthesia Plan Comments: (GA w L Knee block)      Anesthesia Quick Evaluation

## 2020-09-04 ENCOUNTER — Ambulatory Visit (HOSPITAL_BASED_OUTPATIENT_CLINIC_OR_DEPARTMENT_OTHER)
Admission: RE | Admit: 2020-09-04 | Discharge: 2020-09-04 | Disposition: A | Discharge status: Home / Self Care | Payer: 59 | Attending: Orthopedic Surgery | Admitting: Orthopedic Surgery

## 2020-09-04 ENCOUNTER — Ambulatory Visit (HOSPITAL_BASED_OUTPATIENT_CLINIC_OR_DEPARTMENT_OTHER): Payer: 59 | Admitting: Anesthesiology

## 2020-09-04 ENCOUNTER — Encounter (HOSPITAL_BASED_OUTPATIENT_CLINIC_OR_DEPARTMENT_OTHER)
Admission: RE | Disposition: A | Discharge status: Home / Self Care | Payer: Self-pay | Source: Home / Self Care | Attending: Orthopedic Surgery

## 2020-09-04 ENCOUNTER — Other Ambulatory Visit: Payer: Self-pay

## 2020-09-04 ENCOUNTER — Encounter (HOSPITAL_BASED_OUTPATIENT_CLINIC_OR_DEPARTMENT_OTHER): Payer: Self-pay | Admitting: Orthopedic Surgery

## 2020-09-04 ENCOUNTER — Other Ambulatory Visit (HOSPITAL_BASED_OUTPATIENT_CLINIC_OR_DEPARTMENT_OTHER): Payer: Self-pay | Admitting: Physician Assistant

## 2020-09-04 DIAGNOSIS — S83232A Complex tear of medial meniscus, current injury, left knee, initial encounter: Secondary | ICD-10-CM | POA: Insufficient documentation

## 2020-09-04 DIAGNOSIS — Z880 Allergy status to penicillin: Secondary | ICD-10-CM | POA: Insufficient documentation

## 2020-09-04 DIAGNOSIS — M1712 Unilateral primary osteoarthritis, left knee: Secondary | ICD-10-CM | POA: Insufficient documentation

## 2020-09-04 DIAGNOSIS — S83282A Other tear of lateral meniscus, current injury, left knee, initial encounter: Secondary | ICD-10-CM | POA: Insufficient documentation

## 2020-09-04 DIAGNOSIS — E669 Obesity, unspecified: Secondary | ICD-10-CM | POA: Diagnosis present

## 2020-09-04 DIAGNOSIS — M94262 Chondromalacia, left knee: Secondary | ICD-10-CM | POA: Insufficient documentation

## 2020-09-04 DIAGNOSIS — Z720 Tobacco use: Secondary | ICD-10-CM | POA: Diagnosis present

## 2020-09-04 DIAGNOSIS — E782 Mixed hyperlipidemia: Secondary | ICD-10-CM | POA: Diagnosis present

## 2020-09-04 DIAGNOSIS — S83242A Other tear of medial meniscus, current injury, left knee, initial encounter: Secondary | ICD-10-CM | POA: Diagnosis present

## 2020-09-04 DIAGNOSIS — M2342 Loose body in knee, left knee: Secondary | ICD-10-CM | POA: Diagnosis not present

## 2020-09-04 DIAGNOSIS — Z881 Allergy status to other antibiotic agents status: Secondary | ICD-10-CM | POA: Diagnosis not present

## 2020-09-04 DIAGNOSIS — Z885 Allergy status to narcotic agent status: Secondary | ICD-10-CM | POA: Insufficient documentation

## 2020-09-04 DIAGNOSIS — G8918 Other acute postprocedural pain: Secondary | ICD-10-CM | POA: Diagnosis not present

## 2020-09-04 DIAGNOSIS — I10 Essential (primary) hypertension: Secondary | ICD-10-CM | POA: Diagnosis present

## 2020-09-04 DIAGNOSIS — F329 Major depressive disorder, single episode, unspecified: Secondary | ICD-10-CM | POA: Diagnosis present

## 2020-09-04 DIAGNOSIS — X501XXA Overexertion from prolonged static or awkward postures, initial encounter: Secondary | ICD-10-CM | POA: Insufficient documentation

## 2020-09-04 DIAGNOSIS — Z6839 Body mass index (BMI) 39.0-39.9, adult: Secondary | ICD-10-CM | POA: Insufficient documentation

## 2020-09-04 DIAGNOSIS — Z882 Allergy status to sulfonamides status: Secondary | ICD-10-CM | POA: Insufficient documentation

## 2020-09-04 DIAGNOSIS — Z888 Allergy status to other drugs, medicaments and biological substances status: Secondary | ICD-10-CM | POA: Insufficient documentation

## 2020-09-04 HISTORY — DX: Sleep apnea, unspecified: G47.30

## 2020-09-04 HISTORY — PX: KNEE ARTHROSCOPY WITH MEDIAL MENISECTOMY: SHX5651

## 2020-09-04 SURGERY — ARTHROSCOPY, KNEE, WITH MEDIAL MENISCECTOMY
Anesthesia: General | Site: Knee | Laterality: Left

## 2020-09-04 MED ORDER — ONDANSETRON HCL 4 MG/2ML IJ SOLN: 4.0000 mg | Freq: Once | INTRAMUSCULAR | Status: DC | PRN

## 2020-09-04 MED ORDER — PROPOFOL 10 MG/ML IV BOLUS
INTRAVENOUS | Status: DC | PRN
  Administered 2020-09-04: 120 mg via INTRAVENOUS

## 2020-09-04 MED ORDER — LIDOCAINE 2% (20 MG/ML) 5 ML SYRINGE
INTRAMUSCULAR | Status: DC | PRN
  Administered 2020-09-04: 100 mg via INTRAVENOUS

## 2020-09-04 MED ORDER — MIDAZOLAM HCL 2 MG/2ML IJ SOLN
INTRAMUSCULAR | Status: AC
  Filled 2020-09-04: qty 2

## 2020-09-04 MED ORDER — HYDROCODONE-ACETAMINOPHEN 7.5-325 MG PO TABS: 1.0000 | ORAL_TABLET | Freq: Once | ORAL | Status: DC | PRN

## 2020-09-04 MED ORDER — LACTATED RINGERS IV SOLN: INTRAVENOUS | Status: DC

## 2020-09-04 MED ORDER — BUPIVACAINE-EPINEPHRINE (PF) 0.25% -1:200000 IJ SOLN
INTRAMUSCULAR | Status: AC
  Filled 2020-09-04: qty 30

## 2020-09-04 MED ORDER — DEXAMETHASONE SODIUM PHOSPHATE 4 MG/ML IJ SOLN
INTRAMUSCULAR | Status: DC | PRN
  Administered 2020-09-04: 8 mg via INTRAVENOUS

## 2020-09-04 MED ORDER — FENTANYL CITRATE (PF) 100 MCG/2ML IJ SOLN
INTRAMUSCULAR | Status: AC
  Filled 2020-09-04: qty 2

## 2020-09-04 MED ORDER — MEPERIDINE HCL 25 MG/ML IJ SOLN
6.2500 mg | INTRAMUSCULAR | Status: DC | PRN
Start: 2020-09-04 — End: 2020-09-04

## 2020-09-04 MED ORDER — EPINEPHRINE PF 1 MG/ML IJ SOLN
INTRAMUSCULAR | Status: AC
  Filled 2020-09-04: qty 2

## 2020-09-04 MED ORDER — HYDROMORPHONE HCL 1 MG/ML IJ SOLN
0.2500 mg | INTRAMUSCULAR | Status: DC | PRN
Start: 2020-09-04 — End: 2020-09-04

## 2020-09-04 MED ORDER — POVIDONE-IODINE 10 % EX SWAB: 2.0000 "application " | Freq: Once | CUTANEOUS | Status: DC

## 2020-09-04 MED ORDER — HYDROCODONE-ACETAMINOPHEN 5-325 MG PO TABS: 1.0000 | ORAL_TABLET | ORAL | 0 refills | Status: DC | PRN

## 2020-09-04 MED ORDER — HYDROCODONE-ACETAMINOPHEN 5-325 MG PO TABS
1.0000 | ORAL_TABLET | Freq: Once | ORAL | Status: AC | PRN
  Administered 2020-09-04: 1 via ORAL

## 2020-09-04 MED ORDER — AMISULPRIDE (ANTIEMETIC) 5 MG/2ML IV SOLN: 10.0000 mg | Freq: Once | INTRAVENOUS | Status: DC | PRN

## 2020-09-04 MED ORDER — METHYLPREDNISOLONE ACETATE 80 MG/ML IJ SUSP
INTRAMUSCULAR | Status: AC
  Filled 2020-09-04: qty 1

## 2020-09-04 MED ORDER — DEXAMETHASONE SODIUM PHOSPHATE 10 MG/ML IJ SOLN: 8.0000 mg | Freq: Once | INTRAMUSCULAR | Status: DC

## 2020-09-04 MED ORDER — CEFAZOLIN SODIUM-DEXTROSE 2-4 GM/100ML-% IV SOLN
2.0000 g | INTRAVENOUS | Status: AC
  Administered 2020-09-04: 2 g via INTRAVENOUS

## 2020-09-04 MED ORDER — FENTANYL CITRATE (PF) 100 MCG/2ML IJ SOLN
INTRAMUSCULAR | Status: DC | PRN
  Administered 2020-09-04 (×2): 50 ug via INTRAVENOUS

## 2020-09-04 MED ORDER — MIDAZOLAM HCL 2 MG/2ML IJ SOLN
2.0000 mg | Freq: Once | INTRAMUSCULAR | Status: AC
  Administered 2020-09-04: 2 mg via INTRAVENOUS

## 2020-09-04 MED ORDER — BUPIVACAINE-EPINEPHRINE (PF) 0.5% -1:200000 IJ SOLN
INTRAMUSCULAR | Status: DC | PRN
  Administered 2020-09-04: 25 mL

## 2020-09-04 MED ORDER — ONDANSETRON HCL 4 MG/2ML IJ SOLN
INTRAMUSCULAR | Status: DC | PRN
  Administered 2020-09-04: 4 mg via INTRAVENOUS

## 2020-09-04 MED ORDER — HYDROCODONE-ACETAMINOPHEN 5-325 MG PO TABS
ORAL_TABLET | ORAL | Status: AC
  Filled 2020-09-04: qty 1

## 2020-09-04 MED ORDER — BUPIVACAINE-EPINEPHRINE 0.25% -1:200000 IJ SOLN
INTRAMUSCULAR | Status: DC | PRN
  Administered 2020-09-04: 10 mL

## 2020-09-04 MED ORDER — CEFAZOLIN SODIUM-DEXTROSE 2-4 GM/100ML-% IV SOLN
INTRAVENOUS | Status: AC
  Filled 2020-09-04: qty 100

## 2020-09-04 MED ORDER — ACETAMINOPHEN 10 MG/ML IV SOLN: 1000.0000 mg | Freq: Once | INTRAVENOUS | Status: DC | PRN

## 2020-09-04 MED ORDER — FENTANYL CITRATE (PF) 100 MCG/2ML IJ SOLN
100.0000 ug | Freq: Once | INTRAMUSCULAR | Status: AC
  Administered 2020-09-04: 100 ug via INTRAVENOUS

## 2020-09-04 MED ORDER — DEXAMETHASONE SODIUM PHOSPHATE 10 MG/ML IJ SOLN
INTRAMUSCULAR | Status: AC
  Filled 2020-09-04: qty 1

## 2020-09-04 MED ORDER — POVIDONE-IODINE 7.5 % EX SOLN
Freq: Once | CUTANEOUS | Status: DC
  Filled 2020-09-04: qty 118

## 2020-09-04 MED FILL — HYDROCODON-APAP 5-325: 5-325 | 3 days supply | Qty: 20 | Fill #0

## 2020-09-04 SURGICAL SUPPLY — 37 items
BLADE EXCALIBUR 4.0X13 (MISCELLANEOUS) ×2 IMPLANT
BNDG COHESIVE 4X5 TAN STRL (GAUZE/BANDAGES/DRESSINGS) IMPLANT
BNDG ELASTIC 6X5.8 VLCR STR LF (GAUZE/BANDAGES/DRESSINGS) ×2 IMPLANT
COVER WAND RF STERILE (DRAPES) IMPLANT
DISSECTOR  3.8MM X 13CM (MISCELLANEOUS) ×2
DISSECTOR 3.8MM X 13CM (MISCELLANEOUS) ×1 IMPLANT
DISSECTOR 4.0MM X 13CM (MISCELLANEOUS) IMPLANT
DRAPE ARTHROSCOPY W/POUCH 90 (DRAPES) ×2 IMPLANT
DURAPREP 26ML APPLICATOR (WOUND CARE) ×2 IMPLANT
EXCALIBUR 3.8MM X 13CM (MISCELLANEOUS) IMPLANT
GAUZE SPONGE 4X4 12PLY STRL (GAUZE/BANDAGES/DRESSINGS) ×2 IMPLANT
GAUZE XEROFORM 1X8 LF (GAUZE/BANDAGES/DRESSINGS) ×2 IMPLANT
GLOVE SS BIOGEL STRL SZ 7.5 (GLOVE) ×1 IMPLANT
GLOVE SUPERSENSE BIOGEL SZ 7.5 (GLOVE) ×1
GLOVE SURG ENC MOIS LTX SZ7 (GLOVE) ×2 IMPLANT
GLOVE SURG UNDER POLY LF SZ7 (GLOVE) ×2 IMPLANT
GLOVE SURG UNDER POLY LF SZ7.5 (GLOVE) ×2 IMPLANT
GOWN STRL REUS W/ TWL LRG LVL3 (GOWN DISPOSABLE) ×2 IMPLANT
GOWN STRL REUS W/ TWL XL LVL3 (GOWN DISPOSABLE) ×1 IMPLANT
GOWN STRL REUS W/TWL LRG LVL3 (GOWN DISPOSABLE) ×4
GOWN STRL REUS W/TWL XL LVL3 (GOWN DISPOSABLE) ×2
MANIFOLD NEPTUNE II (INSTRUMENTS) ×2 IMPLANT
NDL SAFETY ECLIPSE 18X1.5 (NEEDLE) ×2 IMPLANT
NEEDLE HYPO 18GX1.5 SHARP (NEEDLE) ×4
NEEDLE HYPO 22GX1.5 SAFETY (NEEDLE) ×2 IMPLANT
PACK ARTHROSCOPY DSU (CUSTOM PROCEDURE TRAY) ×2 IMPLANT
PACK BASIN DAY SURGERY FS (CUSTOM PROCEDURE TRAY) ×2 IMPLANT
PAD ALCOHOL SWAB (MISCELLANEOUS) IMPLANT
PENCIL SMOKE EVACUATOR (MISCELLANEOUS) ×2 IMPLANT
PORT APPOLLO RF 90DEGREE MULTI (SURGICAL WAND) IMPLANT
SUT ETHILON 4 0 PS 2 18 (SUTURE) ×2 IMPLANT
SYR 20ML LL LF (SYRINGE) IMPLANT
SYR 5ML LL (SYRINGE) ×2 IMPLANT
TOWEL GREEN STERILE FF (TOWEL DISPOSABLE) ×2 IMPLANT
TUBING ARTHROSCOPY IRRIG 16FT (MISCELLANEOUS) ×2 IMPLANT
WATER STERILE IRR 1000ML POUR (IV SOLUTION) ×2 IMPLANT
WRAP KNEE MAXI GEL POST OP (GAUZE/BANDAGES/DRESSINGS) ×2 IMPLANT

## 2020-09-04 NOTE — Anesthesia Procedure Notes (Signed)
Anesthesia Regional Block: Knee block   Pre-Anesthetic Checklist: ,, timeout performed, Correct Patient, Correct Site, Correct Laterality, Correct Procedure, Correct Position, site marked, Risks and benefits discussed, Surgical consent, At surgeon's request  Laterality: Left and Lower  Prep: alcohol swabs       Needles:  Injection technique: Single-shot     Needle Length: 3cm  Needle Gauge: 21     Additional Needles:   Narrative:  Start time: 09/04/2020 11:52 AM End time: 09/04/2020 11:57 AM  Performed by: Personally  Anesthesiologist: Trevor Iha, MD

## 2020-09-04 NOTE — Transfer of Care (Signed)
Immediate Anesthesia Transfer of Care Note  Patient: [name]  Procedure(s) Performed: KNEE ARTHROSCOPY WITH MEDIAL MENISECTOMY (Left Knee)  Patient Location: PACU  Anesthesia Type:General and Regional  Level of Consciousness: drowsy  Airway & Oxygen Therapy: Patient Spontanous Breathing and Patient connected to face mask oxygen  Post-op Assessment: Report given to RN and Post -op Vital signs reviewed and stable  Post vital signs: Reviewed and stable  Last Vitals:  Vitals Value Taken Time  BP 116/70 09/04/20 1306  Temp    Pulse 79 09/04/20 1308  Resp 12 09/04/20 1308  SpO2 100 % 09/04/20 1308  Vitals shown include unvalidated device data.  Last Pain:  Vitals:   09/04/20 1045  TempSrc: Oral  PainSc: 5       Patients Stated Pain Goal: 6 (09/04/20 1045)  Complications: No complications documented.

## 2020-09-04 NOTE — Discharge Instructions (Signed)
Knee arthroscopy Care After Refer to this sheet in the next few weeks. These discharge instructions provide you with general information on caring for yourself after you leave the hospital. Your caregiver may also give you specific instructions. Your treatment has been planned according to the most current medical practices available, but unavoidable complications sometimes occur. If you have any problems or questions after discharge, please call your caregiver. HOME INSTRUCTIONS You may resume a normal diet and activities as directed. Perform exercises as directed.  Take showers instead of baths until informed otherwise.  Change bandages (dressings) in 3 days.  Swab wounds daily with betadine.  Wash leg with soap and water.  Pat dry.  Cover wounds with bandaids. Only take over-the-counter or prescription medicines for pain, discomfort, or fever as directed by your caregiver.  Eat a well-balanced diet.  Avoid lifting or driving until you are instructed otherwise.  Make an appointment to see your caregiver for stitches (suture) or staple removal as directed.   SEEK MEDICAL CARE IF: You have swelling of your calf or leg.  You develop shortness of breath or chest pain.  You have redness, swelling, or increasing pain in the wound.  There is pus or any unusual drainage coming from the surgical site.  You notice a bad smell coming from the surgical site or dressing.  The surgical site breaks open after sutures or staples have been removed.  There is persistent bleeding from the suture or staple line.  You are getting worse or are not improving.  You have any other questions or concerns.  SEEK IMMEDIATE MEDICAL CARE IF:  You have a fever.  You develop a rash.  You have difficulty breathing.  You develop any reaction or side effects to medicines given.  Your knee motion is decreasing rather than improving.  MAKE SURE YOU:  Understand these instructions.  Will watch your condition.  Will get  help right away if you are not doing well or get worse.   Regional Anesthesia Blocks  1. Numbness or the inability to move the "blocked" extremity may last from 3-48 hours after placement. The length of time depends on the medication injected and your individual response to the medication. If the numbness is not going away after 48 hours, call your surgeon.  2. The extremity that is blocked will need to be protected until the numbness is gone and the  Strength has returned. Because you cannot feel it, you will need to take extra care to avoid injury. Because it may be weak, you may have difficulty moving it or using it. You may not know what position it is in without looking at it while the block is in effect.  3. For blocks in the legs and feet, returning to weight bearing and walking needs to be done carefully. You will need to wait until the numbness is entirely gone and the strength has returned. You should be able to move your leg and foot normally before you try and bear weight or walk. You will need someone to be with you when you first try to ensure you do not fall and possibly risk injury.  4. Bruising and tenderness at the needle site are common side effects and will resolve in a few days.  5. Persistent numbness or new problems with movement should be communicated to the surgeon or the [facility] [phone] [facility] [phone]).  Post Anesthesia Home Care Instructions  Activity: Get plenty of rest for  the remainder of the day. A responsible individual must stay with you for 24 hours following the procedure.  For the next 24 hours, DO NOT: -Drive a car -Advertising copywriter -Drink alcoholic beverages -Take any medication unless instructed by your physician -Make any legal decisions or sign important papers.  Meals: Start with liquid foods such as gelatin or soup. Progress to regular foods as tolerated. Avoid greasy, spicy, heavy foods. If nausea  and/or vomiting occur, drink only clear liquids until the nausea and/or vomiting subsides. Call your physician if vomiting continues.  Special Instructions/Symptoms: Your throat may feel dry or sore from the anesthesia or the breathing tube placed in your throat during surgery. If this causes discomfort, gargle with warm salt water. The discomfort should disappear within 24 hours.  If you had a scopolamine patch placed behind your ear for the management of post- operative nausea and/or vomiting:  1. The medication in the patch is effective for 72 hours, after which it should be removed.  Wrap patch in a tissue and discard in the trash. Wash hands thoroughly with soap and water. 2. You may remove the patch earlier than 72 hours if you experience unpleasant side effects which may include dry mouth, dizziness or visual disturbances. 3. Avoid touching the patch. Wash your hands with soap and water after contact with the patch.

## 2020-09-04 NOTE — Anesthesia Procedure Notes (Signed)
Procedure Name: LMA Insertion Performed by: Ranson Belluomini S, CRNA Pre-anesthesia Checklist: Patient identified, Emergency Drugs available, Suction available and Patient being monitored Patient Re-evaluated:Patient Re-evaluated prior to induction Oxygen Delivery Method: Circle System Utilized Preoxygenation: Pre-oxygenation with 100% oxygen Induction Type: IV induction Ventilation: Mask ventilation without difficulty LMA: LMA inserted LMA Size: 4.0 Number of attempts: 1 Airway Equipment and Method: Bite block Placement Confirmation: positive ETCO2 Tube secured with: Tape Dental Injury: Teeth and Oropharynx as per pre-operative assessment  Comments: Eyes taped prior to insertion       

## 2020-09-04 NOTE — Interval H&P Note (Signed)
History and Physical Interval Note:  09/04/2020 12:17 PM  [name]  has presented today for surgery, with the diagnosis of LEFT KNEE MEDIAL MENISCUS TEAR.  The various methods of treatment have been discussed with the patient and family. After consideration of risks, benefits and other options for treatment, the patient has consented to  Procedure(s): KNEE ARTHROSCOPY WITH MEDIAL MENISECTOMY (Left) as a surgical intervention.  The patient's history has been reviewed, patient examined, no change in status, stable for surgery.  I have reviewed the patient's chart and labs.  Questions were answered to the patient's satisfaction.     [name]

## 2020-09-04 NOTE — Progress Notes (Signed)
Assisted Dr. Houser with left, knee block. Side rails up, monitors on throughout procedure. See vital signs in flow sheet. Tolerated Procedure well. 

## 2020-09-04 NOTE — Anesthesia Postprocedure Evaluation (Signed)
Anesthesia Post Note  Patient: [name]  Procedure(s) Performed: KNEE ARTHROSCOPY WITH MEDIAL MENISECTOMY (Left Knee)     Patient location during evaluation: PACU Anesthesia Type: General Level of consciousness: awake and alert Pain management: pain level controlled Vital Signs Assessment: post-procedure vital signs reviewed and stable Respiratory status: spontaneous breathing, nonlabored ventilation, respiratory function stable and patient connected to nasal cannula oxygen Cardiovascular status: blood pressure returned to baseline and stable Postop Assessment: no apparent nausea or vomiting Anesthetic complications: no   No complications documented.  Last Vitals:  Vitals:   09/04/20 1315 09/04/20 1335  BP: 115/82 108/69  Pulse: 84 78  Resp: 13 12  Temp:  36.8 C  SpO2: 97% 97%    Last Pain:  Vitals:   09/04/20 1415  TempSrc:   PainSc: 2                  Trevor Iha

## 2020-09-05 ENCOUNTER — Encounter (HOSPITAL_BASED_OUTPATIENT_CLINIC_OR_DEPARTMENT_OTHER): Payer: Self-pay | Admitting: Orthopedic Surgery

## 2020-09-05 NOTE — Op Note (Unsigned)
NAMECARRINGTON, [name] MEDICAL RECORD NO: [id number] ACCOUNT NO: [id number] DATE OF BIRTH: 1965-12-23 FACILITY: [facility] LOCATION: [facility] PHYSICIAN: Elana Alm. Thurston Hole, MD  Operative Report   DATE OF PROCEDURE: 09/04/2020  PREOPERATIVE DIAGNOSES:   1.  Left knee acute traumatic medial and lateral meniscal tears. 2.  Left knee acute traumatic loose body. 3.  Left knee chondromalacia/osteoarthritis.  POSTOPERATIVE DIAGNOSES:   1.  Left knee acute traumatic medial and lateral meniscal tears. 2.  Left knee acute traumatic loose body. 3.  Left knee chondromalacia/osteoarthritis.  PROCEDURE PERFORMED:   1.  Left knee EUA followed by arthroscopic partial medial and lateral meniscectomies. 2.  Left knee loose body excision. 3.  Left knee chondroplasty.  SURGEON:  Elana Alm. Thurston Hole, MD.  ASSISTANTJulien Girt, PA  ANESTHESIA:  General.   OPERATIVE TIME:  30 minutes.  COMPLICATIONS:  None.  INDICATIONS FOR PROCEDURE:  The patient is a 55 year old who has had repetitive twisting injuries to her left knee over the past 5-6 months with exam, x-rays and MRI revealing medial and lateral meniscal tears with chondromalacia, possible loose body.   She has failed conservative care, and is now to undergo arthroscopy.  DESCRIPTION:  The patient was brought to the operating room on 09/04/2020 after knee block was placed in the holding room by Anesthesia.  She was placed on the operating table in supine position.  She received antibiotics preoperatively for prophylaxis.   After being placed under general anesthesia, her left knee was examined.  She had range of motion 0-125 degrees.  Knee with stable ligamentous exam with normal patellar tracking.  Left leg was prepped in the usual sterile DuraPrep and draped using  sterile technique.  Timeout procedure was called and the correct left knee identified.  Initially through an anterolateral portal, the arthroscope with a pump attached was placed  into an anteromedial portal, an arthroscopic probe was placed. On initial  inspection of the medial compartment,  she had 25% to 30%, grade IV chondromalacia/osteoarthritis and 50% grade III and this was debrided.  Medial meniscus showed complex tearing of the posterior medial horn of which 50% to 60% was resected back to a  stable rim.  Intercondylar notch was inspected.  Anterior and posterior cruciate ligaments were normal.  Lateral compartment showed 30% grade III chondromalacia, which was debrided.  Lateral meniscus tear, 30% posterolateral horn, which was resected back  to a stable rim.  Patellofemoral joint showed 50% to 60% grade III chondromalacia, which was debrided.  The patella tracked normally.  Moderate synovitis in the medial and lateral gutters were debrided and there was a loose body noted in the lateral  gutter and 1 x 1.5 cm, which was removed through the lateral portal, which had to be extended to remove this.  After this was done, it was felt that all pathology had been satisfactorily addressed.  The instruments were removed.  Portals were closed with  3-0 nylon suture and the knee injected with 80 mg of Depo-Medrol and 10 mL of Marcaine with epinephrine.  Sterile dressings were applied and the patient was awakened and taken to recovery room in stable condition.    FOLLOWUP CARE: The patient will be followed as an outpatient on Norco for pain.  I will see her back in the office in a week for sutures out and follow up.   PAA D: 09/04/2020 4:45:35 pm T: 09/05/2020 12:55:00 am  JOB: [id number]/ [id number]

## 2020-09-13 ENCOUNTER — Other Ambulatory Visit: Payer: Self-pay | Admitting: Physician Assistant

## 2020-09-13 DIAGNOSIS — M1712 Unilateral primary osteoarthritis, left knee: Secondary | ICD-10-CM

## 2020-09-13 DIAGNOSIS — S83242D Other tear of medial meniscus, current injury, left knee, subsequent encounter: Secondary | ICD-10-CM

## 2020-09-13 DIAGNOSIS — S83262D Peripheral tear of lateral meniscus, current injury, left knee, subsequent encounter: Secondary | ICD-10-CM

## 2020-09-14 ENCOUNTER — Other Ambulatory Visit (HOSPITAL_COMMUNITY): Payer: Self-pay | Admitting: Family Medicine

## 2020-09-14 MED FILL — VENLAFAXINE HCL ER 75 MG CA: 75 | 90 days supply | Qty: 180 | Fill #0

## 2020-09-19 ENCOUNTER — Encounter (HOSPITAL_BASED_OUTPATIENT_CLINIC_OR_DEPARTMENT_OTHER): Payer: Self-pay | Admitting: Physical Therapy

## 2020-09-19 ENCOUNTER — Ambulatory Visit (HOSPITAL_BASED_OUTPATIENT_CLINIC_OR_DEPARTMENT_OTHER): Payer: 59 | Attending: Physician Assistant | Admitting: Physical Therapy

## 2020-09-19 ENCOUNTER — Other Ambulatory Visit: Payer: Self-pay

## 2020-09-19 DIAGNOSIS — M25561 Pain in right knee: Secondary | ICD-10-CM | POA: Diagnosis not present

## 2020-09-19 DIAGNOSIS — M25562 Pain in left knee: Secondary | ICD-10-CM | POA: Insufficient documentation

## 2020-09-19 DIAGNOSIS — G8929 Other chronic pain: Secondary | ICD-10-CM | POA: Insufficient documentation

## 2020-09-19 DIAGNOSIS — M6281 Muscle weakness (generalized): Secondary | ICD-10-CM | POA: Insufficient documentation

## 2020-09-19 DIAGNOSIS — M25662 Stiffness of left knee, not elsewhere classified: Secondary | ICD-10-CM | POA: Diagnosis not present

## 2020-09-19 NOTE — Addendum Note (Signed)
Addended by: Jules Husbands MARIE L on: 09/19/2020 10:51 AM   Modules accepted: Orders

## 2020-09-19 NOTE — Therapy (Signed)
[facility] [address] [place], [place], [address] Phone: [phone]   Fax:  [phone]  Physical Therapy Evaluation  Patient Details  Name: DOCIA KLAR MRN: [id number] Date of Birth: 1966/01/26 Referring Provider (PT): Julien Girt, [place]   Encounter Date: 09/19/2020   PT End of Session - 09/19/20 0931    Visit Number 1    Number of Visits 7    Date for PT Re-Evaluation 10/31/20    Authorization Type [facility]    PT Start Time 0845    PT Stop Time 0930    PT Time Calculation (min) 45 min    Activity Tolerance Patient tolerated treatment well    Behavior During Therapy [facility] for tasks assessed/performed           Past Medical History:  Diagnosis Date  . Acute medial meniscus tear of left knee 08/21/2020  . Allergic rhinitis due to pollen 08/21/2020  . Benign hypertension 08/21/2020  . Genital warts 08/21/2020  . Hematuria 08/21/2020  . Major depression 08/21/2020  . Mixed hyperlipidemia 08/21/2020  . Obesity 08/21/2020  . Sleep apnea   . Somnolence 08/21/2020  . Tobacco abuse 08/21/2020    Past Surgical History:  Procedure Laterality Date  . ANKLE FRACTURE SURGERY    . CESAREAN SECTION    . FRACTURE SURGERY    . HAND SURGERY    . KNEE ARTHROSCOPY WITH MEDIAL MENISECTOMY Left 09/04/2020   Procedure: KNEE ARTHROSCOPY WITH MEDIAL MENISECTOMY;  Surgeon: Salvatore Marvel, MD;  Location: [facility];  Service: Orthopedics;  Laterality: Left;  . ROTATOR CUFF REPAIR    . TUBAL LIGATION      There were no vitals filed for this visit.    Subjective Assessment - 09/19/20 0848    Subjective Pt got meniscus surgery on Mar 1. Pt has had bilat knee pain for years though. Pt reports she's had a right rotator cuff repar in the past. Pt got a membership at [employer] but got frustrated. Pt states L knee has been doing good after surgery. Range of motion has improved. Pt is to get an offloading brace until swelling  goes down. Pt is resting and icing for swelling.    Limitations Standing;Walking    How long can you sit comfortably? n/a    How long can you stand comfortably? Manageable    Patient Stated Goals Improve pain and strengthen; lose weight    Currently in Pain? Yes    Pain Score 1     Pain Location Knee    Pain Orientation Left    Pain Descriptors / Indicators Aching    Pain Type Surgical pain    Pain Onset More than a month ago    Aggravating Factors  Awkward/uneven steps    Pain Relieving Factors Aleve and tylenol; ice              [facility] PT Assessment - 09/19/20 0001      Assessment   Medical Diagnosis Knee arthroscopy with medial menisectomy    Referring Provider (PT) Tomasa Rand, Kirstin, PA-C    Onset Date/Surgical Date 09/04/20    Hand Dominance Right    Prior Therapy Yes      Precautions   Precautions None      Restrictions   Weight Bearing Restrictions No      Balance Screen   Has the patient fallen in the past 6 months No   Some instance sof knee buckling  Home Environment   Living Environment Private residence    Living Arrangements Alone    Available Help at Discharge Family    Type of Home House    Home Access Stairs to enter    Entrance Stairs-Number of Steps 6   at her house; 12 steps in grandmother's house   Home Layout One level    Home Equipment None      Prior Function   Level of Independence Independent    Vocation Full time employment    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    Leisure Play with grandkids, walk dog      Observation/Other Assessments   Focus on Therapeutic Outcomes (FOTO)  n/a      Sensation   Light Touch Appears Intact      Functional Tests   Functional tests Sit to Stand      Sit to Stand   Comments Uses bilat UEs, decrease weight on R      AROM   Right/Left Knee Right;Left    Right Knee Extension 0    Right Knee Flexion 120    Left Knee Extension -8    Left Knee Flexion 115      Strength    Strength Assessment Site Hip;Knee;Ankle    Right Hip Flexion 5/5    Right Hip Extension 3+/5    Right Hip ABduction 4+/5    Left Hip Flexion 4+/5    Left Hip Extension 3+/5    Left Hip ABduction 4-/5    Right Knee Flexion 4+/5    Right Knee Extension 4+/5    Left Knee Flexion 4/5   Limited due to pain   Left Knee Extension 4+/5      Palpation   Palpation comment TTP along distal glute max origin and obturator internus                      Objective measurements completed on examination: See above findings.                    PT Long Term Goals - 09/19/20 1042      PT LONG TERM GOAL #1   Title be independent in advanced HEP for return to wellness program    Time 6    Period Weeks    Status New    Target Date 10/31/20      PT LONG TERM GOAL #2   Title Pt will report decrease in R knee pain by 50% with activity    Time 6    Period Weeks    Status New    Target Date 10/31/20      PT LONG TERM GOAL #3   Title Pt will be able to perform functional squat with 25# without pain to demonstrate improved functional strength    Baseline Unable without pain    Time 6    Period Weeks    Status New    Target Date 10/31/20                  Plan - 09/19/20 0932    Clinical Impression Statement Ms. Eunice Blase is a 55 y/o F presenting to [facility] s/p L knee arthroscopy and menisectomy on Sep 04, 2020. Pt with history of bilateral medial knee pain, rotator cuff repair on R and shoulder issues on L, along with history of L ankle surgery. On assessment, pt with likely strained R glute (pt reports she believes  it happened during work), decreased bilat hip extensor strength, decreased L hip abductor strength, and L less than R knee ROM (this could be because of edema). Pt would benefit from PT to safely progress her strength for improved home and work function. Pt is interested in aquatic therapy once her wound heals.    Personal Factors and Comorbidities  Age;Fitness;Comorbidity 1;Time since onset of injury/illness/exacerbation;Profession    Comorbidities OA, medial knee pain, R rotator cuff repair, L shoulder pain, L ankle surgery    Examination-Activity Limitations Squat;Sit;Stairs;Locomotion Level;Lift;Transfers    Examination-Participation Restrictions Community Activity;Occupation;Shop;Yard Work;Cleaning    Stability/Clinical Decision Making Stable/Uncomplicated    Clinical Decision Making Low    Rehab Potential Good    PT Frequency Other (comment)   1x/wk for 6 wks. Begin aquatics every other week starting April   PT Duration 6 weeks    PT Treatment/Interventions ADLs/Self Care Home Management;Aquatic Therapy;Cryotherapy;Electrical Stimulation;Iontophoresis 4mg /ml Dexamethasone;Moist Heat;Ultrasound;Gait training;Stair training;Functional mobility training;Therapeutic activities;Therapeutic exercise;Balance training;Neuromuscular re-education;Manual techniques;Patient/family education;Passive range of motion;Taping;Dry needling    PT Next Visit Plan Assess response to HEP, progress as able. Continue glute strengthening, abductors and adductors, hamstring, and quad eccentrics for control    PT Home Exercise Plan Access Code: [email or web address]    Consulted and Agree with Plan of Care Patient           Patient will benefit from skilled therapeutic intervention in order to improve the following deficits and impairments:  Abnormal gait,Pain,Increased fascial restricitons,Decreased mobility,Decreased range of motion,Decreased strength,Increased edema,Difficulty walking  Visit Diagnosis: Chronic pain of left knee  Chronic pain of right knee  Stiffness of left knee, not elsewhere classified  Muscle weakness (generalized)     Problem List Patient Active Problem List   Diagnosis Date Noted  . Allergic rhinitis due to pollen 08/21/2020  . Benign hypertension 08/21/2020  . Genital warts 08/21/2020  . Hematuria 08/21/2020  . Major depression  08/21/2020  . Mixed hyperlipidemia 08/21/2020  . Obesity 08/21/2020  . Somnolence 08/21/2020  . Tobacco abuse 08/21/2020  . Acute medial meniscus tear of left knee 08/21/2020    [facility] April Ma L [place] PT, DPT 09/19/2020, 10:47 AM  [facility] [address] [place], [place], [place] Phone: [phone]   Fax:  [phone]  Name: KEVA DARTY MRN: [name] Date of Birth: [birth year]

## 2020-09-26 ENCOUNTER — Other Ambulatory Visit (HOSPITAL_COMMUNITY): Payer: Self-pay | Admitting: Physician Assistant

## 2020-09-26 MED FILL — HYDROCODON-APAP 5-325: 5-325 | 5 days supply | Qty: 15 | Fill #0

## 2020-09-26 MED FILL — CELECOXIB 200 MG CAP: 200 | 30 days supply | Qty: 30 | Fill #0

## 2020-09-27 DIAGNOSIS — M1711 Unilateral primary osteoarthritis, right knee: Secondary | ICD-10-CM | POA: Diagnosis not present

## 2020-09-27 DIAGNOSIS — M25462 Effusion, left knee: Secondary | ICD-10-CM | POA: Diagnosis not present

## 2020-09-27 DIAGNOSIS — M25562 Pain in left knee: Secondary | ICD-10-CM | POA: Diagnosis not present

## 2020-09-28 ENCOUNTER — Other Ambulatory Visit (HOSPITAL_COMMUNITY): Payer: Self-pay | Admitting: Physician Assistant

## 2020-09-29 MED FILL — predniSONE 10 MG TABS: 10 | 12 days supply | Qty: 48 | Fill #0

## 2020-10-12 ENCOUNTER — Ambulatory Visit (HOSPITAL_BASED_OUTPATIENT_CLINIC_OR_DEPARTMENT_OTHER): Payer: 59 | Attending: Physician Assistant | Admitting: Physical Therapy

## 2020-10-12 ENCOUNTER — Other Ambulatory Visit: Payer: Self-pay

## 2020-10-12 DIAGNOSIS — M25561 Pain in right knee: Secondary | ICD-10-CM | POA: Diagnosis not present

## 2020-10-12 DIAGNOSIS — M25562 Pain in left knee: Secondary | ICD-10-CM | POA: Diagnosis not present

## 2020-10-12 DIAGNOSIS — M6281 Muscle weakness (generalized): Secondary | ICD-10-CM | POA: Insufficient documentation

## 2020-10-12 DIAGNOSIS — G8929 Other chronic pain: Secondary | ICD-10-CM | POA: Insufficient documentation

## 2020-10-12 DIAGNOSIS — M25662 Stiffness of left knee, not elsewhere classified: Secondary | ICD-10-CM | POA: Diagnosis not present

## 2020-10-12 NOTE — Therapy (Signed)
[facility] [address] [place], [place], [address] Phone: [phone]   Fax:  [phone]  Physical Therapy Treatment  Patient Details  Name: [name] MRN: [id number] Date of Birth: 19-Mar-1966 Referring Provider (PT): Julien Girt, [place]   Encounter Date: 10/12/2020   PT End of Session - 10/12/20 0926    Visit Number 2    Number of Visits 7    Date for PT Re-Evaluation 10/31/20    Authorization Type [facility]    PT Start Time 0930    PT Stop Time 1015    PT Time Calculation (min) 45 min    Activity Tolerance Patient tolerated treatment well    Behavior During Therapy [facility] for tasks assessed/performed           Past Medical History:  Diagnosis Date  . Acute medial meniscus tear of left knee 08/21/2020  . Allergic rhinitis due to pollen 08/21/2020  . Benign hypertension 08/21/2020  . Genital warts 08/21/2020  . Hematuria 08/21/2020  . Major depression 08/21/2020  . Mixed hyperlipidemia 08/21/2020  . Obesity 08/21/2020  . Sleep apnea   . Somnolence 08/21/2020  . Tobacco abuse 08/21/2020    Past Surgical History:  Procedure Laterality Date  . ANKLE FRACTURE SURGERY    . CESAREAN SECTION    . FRACTURE SURGERY    . HAND SURGERY    . KNEE ARTHROSCOPY WITH MEDIAL MENISECTOMY Left 09/04/2020   Procedure: KNEE ARTHROSCOPY WITH MEDIAL MENISECTOMY;  Surgeon: Salvatore Marvel, MD;  Location: [facility];  Service: Orthopedics;  Laterality: Left;  . ROTATOR CUFF REPAIR    . TUBAL LIGATION      There were no vitals filed for this visit.   Subjective Assessment - 10/12/20 0928    Subjective Pt states she was pain free prior to last visit. Since then her knee pain has increased -- she feels it mostly in her medial L knee. She has been on steroids, icing, heating, and had fluid drawn out of her knee. Pt has been using knee sleeve. Pt states her glute pain has improved.    Limitations Standing;Walking    How  long can you sit comfortably? n/a    How long can you stand comfortably? Manageable    Patient Stated Goals Improve pain and strengthen; lose weight    Currently in Pain? Yes    Pain Score 5     Pain Location Knee    Pain Orientation Left    Pain Descriptors / Indicators Aching    Pain Onset More than a month ago                             [facility] Adult PT Treatment/Exercise - 10/12/20 0001      Ambulation/Gait   Ambulation Distance (Feet) 300 Feet    Assistive device None    Gait Comments Antalgic with decreased L weightshift, wide BOS, and L knee hyperextension on initial amb; after taping improved weight shift and decreased antalgic gait      Exercises   Exercises Knee/Hip      Knee/Hip Exercises: Stretches   Lobbyist Left;30 seconds;2 reps;Right      Knee/Hip Exercises: Standing   Hip Abduction Stengthening;Both;10 reps;Knee straight      Knee/Hip Exercises: Seated   Long Arc Quad Strengthening;Left;10 reps    Long Arc Quad Limitations With ball squeeze      Knee/Hip Exercises: Prone  Hamstring Curl 2 sets;10 reps    Hip Extension Both;2 sets;Strengthening;10 reps   knee bent     Manual Therapy   Manual Therapy Soft tissue mobilization;Taping    Soft tissue mobilization STM quad    McConnell Taping to prevent knee hyperextension    Kinesiotex Create Space      Kinesiotix   Create Space Medial L knee                  PT Education - 10/12/20 1245    Education Details Discussed self massage, quad stretching, and hamstring strengthening to reduce knee hyperextension    Person(s) Educated Patient    Methods Explanation    Comprehension Verbalized understanding               PT Long Term Goals - 10/12/20 1250      PT LONG TERM GOAL #1   Title be independent in advanced HEP for return to wellness program    Time 6    Period Weeks    Status New      PT LONG TERM GOAL #2   Title Pt will report decrease in R knee pain by 50%  with activity    Time 6    Period Weeks    Status New      PT LONG TERM GOAL #3   Title Pt will be able to perform functional squat with 25# without pain to demonstrate improved functional strength    Baseline Unable without pain    Time 6    Period Weeks    Status New                 Plan - 10/12/20 1245    Clinical Impression Statement Treatment session focused on addressing pt's L medial knee pain. Pt with increased pain upon full L knee weight bearing in complete standing. Pt found to have hyperextending L knee likely placing increased anteromedial pressure in her knee. Provided manual therapy to address overactive quads and taping to assist with knee hyperextension. Reported no pain with walking after taping. Worked on hamstring strengthening -- next session she would benefit from quad eccentric strengthening to further prevent hyperextension    Personal Factors and Comorbidities Age;Fitness;Comorbidity 1;Time since onset of injury/illness/exacerbation;Profession    Comorbidities OA, medial knee pain, R rotator cuff repair, L shoulder pain, L ankle surgery    Examination-Activity Limitations Squat;Sit;Stairs;Locomotion Level;Lift;Transfers    Examination-Participation Restrictions Community Activity;Occupation;Shop;Yard Work;Cleaning    Stability/Clinical Decision Making Stable/Uncomplicated    Rehab Potential Good    PT Frequency Other (comment)   1x/wk for 6 wks. Begin aquatics every other week starting April   PT Duration 6 weeks    PT Treatment/Interventions ADLs/Self Care Home Management;Aquatic Therapy;Cryotherapy;Electrical Stimulation;Iontophoresis 4mg /ml Dexamethasone;Moist Heat;Ultrasound;Gait training;Stair training;Functional mobility training;Therapeutic activities;Therapeutic exercise;Balance training;Neuromuscular re-education;Manual techniques;Patient/family education;Passive range of motion;Taping;Dry needling    PT Next Visit Plan Assess response to HEP,  progress as able. Continue glute strengthening, abductors and adductors, hamstring, and quad eccentrics for control    PT Home Exercise Plan Access Code: [email or web address]    Consulted and Agree with Plan of Care Patient           Patient will benefit from skilled therapeutic intervention in order to improve the following deficits and impairments:  Abnormal gait,Pain,Increased fascial restricitons,Decreased mobility,Decreased range of motion,Decreased strength,Increased edema,Difficulty walking  Visit Diagnosis: Chronic pain of left knee  Chronic pain of right knee  Stiffness of left knee, not elsewhere classified  Muscle  weakness (generalized)     Problem List Patient Active Problem List   Diagnosis Date Noted  . Allergic rhinitis due to pollen 08/21/2020  . Benign hypertension 08/21/2020  . Genital warts 08/21/2020  . Hematuria 08/21/2020  . Major depression 08/21/2020  . Mixed hyperlipidemia 08/21/2020  . Obesity 08/21/2020  . Somnolence 08/21/2020  . Tobacco abuse 08/21/2020  . Acute medial meniscus tear of left knee 08/21/2020    [facility] April Ma L [place] PT, DPT 10/12/2020, 12:58 PM  [facility] [address] [place], [place], [address] Phone: [phone]   Fax:  [phone]  Name: [name] MRN: [id number] Date of Birth: [birth year]

## 2020-10-18 ENCOUNTER — Other Ambulatory Visit: Payer: Self-pay

## 2020-10-18 ENCOUNTER — Ambulatory Visit (HOSPITAL_BASED_OUTPATIENT_CLINIC_OR_DEPARTMENT_OTHER): Payer: 59 | Admitting: Physical Therapy

## 2020-10-18 DIAGNOSIS — M25662 Stiffness of left knee, not elsewhere classified: Secondary | ICD-10-CM | POA: Diagnosis not present

## 2020-10-18 DIAGNOSIS — G8929 Other chronic pain: Secondary | ICD-10-CM

## 2020-10-18 DIAGNOSIS — M25562 Pain in left knee: Secondary | ICD-10-CM | POA: Diagnosis not present

## 2020-10-18 DIAGNOSIS — M25561 Pain in right knee: Secondary | ICD-10-CM

## 2020-10-18 DIAGNOSIS — M6281 Muscle weakness (generalized): Secondary | ICD-10-CM

## 2020-10-18 DIAGNOSIS — M1711 Unilateral primary osteoarthritis, right knee: Secondary | ICD-10-CM | POA: Diagnosis not present

## 2020-10-18 NOTE — Therapy (Signed)
[facility] [address] [place], [place], [address] Phone: [phone]   Fax:  [phone]  Physical Therapy Treatment  Patient Details  Name: [name] MRN: [id number] Date of Birth: 09/29/1965 Referring Provider (PT): Julien Girt, [place]   Encounter Date: 10/18/2020   PT End of Session - 10/18/20 1443    Visit Number 3    Number of Visits 7    Date for PT Re-Evaluation 10/31/20    Authorization Type [facility]    PT Start Time 1400    PT Stop Time 1440    PT Time Calculation (min) 40 min    Activity Tolerance Patient tolerated treatment well    Behavior During Therapy [facility] for tasks assessed/performed           Past Medical History:  Diagnosis Date  . Acute medial meniscus tear of left knee 08/21/2020  . Allergic rhinitis due to pollen 08/21/2020  . Benign hypertension 08/21/2020  . Genital warts 08/21/2020  . Hematuria 08/21/2020  . Major depression 08/21/2020  . Mixed hyperlipidemia 08/21/2020  . Obesity 08/21/2020  . Sleep apnea   . Somnolence 08/21/2020  . Tobacco abuse 08/21/2020    Past Surgical History:  Procedure Laterality Date  . ANKLE FRACTURE SURGERY    . CESAREAN SECTION    . FRACTURE SURGERY    . HAND SURGERY    . KNEE ARTHROSCOPY WITH MEDIAL MENISECTOMY Left 09/04/2020   Procedure: KNEE ARTHROSCOPY WITH MEDIAL MENISECTOMY;  Surgeon: Salvatore Marvel, MD;  Location: [facility];  Service: Orthopedics;  Laterality: Left;  . ROTATOR CUFF REPAIR    . TUBAL LIGATION      There were no vitals filed for this visit.   Subjective Assessment - 10/18/20 1401    Subjective Pt states that the tape felt really good and lasted a few days. Pt states she is to get an offloading brace and shot.    Limitations Standing;Walking    How long can you sit comfortably? n/a    How long can you stand comfortably? Manageable    Patient Stated Goals Improve pain and strengthen; lose weight    Currently  in Pain? Yes    Pain Score 3     Pain Orientation Left    Pain Descriptors / Indicators Aching    Pain Type Surgical pain    Pain Onset More than a month ago                        [facility] Adult PT Treatment/Exercise - 10/18/20 0001      Ambulation/Gait   Ambulation Distance (Feet) 300 Feet    Assistive device None    Gait Comments Antalgic with decreased L weightshift, wide BOS, and L knee hyperextension on initial amb; after taping improved weight shift and decreased antalgic gait      Knee/Hip Exercises: Machines for Strengthening   Cybex Leg Press 60# x10 SL    Other Machine Shuttle 75# DL press 4W10; 27# SL 2Z36      Knee/Hip Exercises: Standing   Step Down Left;5 reps;Step Height: 2"   attempted but too much pain     Manual Therapy   McConnell Taping to prevent knee hyperextension    Kinesiotex Create Space      Kinesiotix   Create Space Medial L knee                  PT Education - 10/18/20  1445    Education Details Educated pt on appropriate gym equipment to utilize for knee strengthening.    Person(s) Educated Patient    Methods Explanation    Comprehension Verbalized understanding               PT Long Term Goals - 10/12/20 1250      PT LONG TERM GOAL #1   Title be independent in advanced HEP for return to wellness program    Time 6    Period Weeks    Status New      PT LONG TERM GOAL #2   Title Pt will report decrease in R knee pain by 50% with activity    Time 6    Period Weeks    Status New      PT LONG TERM GOAL #3   Title Pt will be able to perform functional squat with 25# without pain to demonstrate improved functional strength    Baseline Unable without pain    Time 6    Period Weeks    Status New                 Plan - 10/18/20 1429    Clinical Impression Statement Pt liked taping -- educated pt on how to perform taping at home. Focused on knee neuromuscular control this session with eccentric leg  presses. Attempted with pt's full weight; however, unable to perform without pain.    Personal Factors and Comorbidities Age;Fitness;Comorbidity 1;Time since onset of injury/illness/exacerbation;Profession    Comorbidities OA, medial knee pain, R rotator cuff repair, L shoulder pain, L ankle surgery    Examination-Activity Limitations Squat;Sit;Stairs;Locomotion Level;Lift;Transfers    Examination-Participation Restrictions Community Activity;Occupation;Shop;Yard Work;Cleaning    Stability/Clinical Decision Making Stable/Uncomplicated    Rehab Potential Good    PT Frequency Other (comment)   1x/wk for 6 wks. Begin aquatics every other week starting April   PT Duration 6 weeks    PT Treatment/Interventions ADLs/Self Care Home Management;Aquatic Therapy;Cryotherapy;Electrical Stimulation;Iontophoresis 4mg /ml Dexamethasone;Moist Heat;Ultrasound;Gait training;Stair training;Functional mobility training;Therapeutic activities;Therapeutic exercise;Balance training;Neuromuscular re-education;Manual techniques;Patient/family education;Passive range of motion;Taping;Dry needling    PT Next Visit Plan Assess response to HEP, progress as able. Continue glute strengthening, abductors and adductors, hamstring, and quad eccentrics for control    PT Home Exercise Plan Access Code: [email or web address]    Consulted and Agree with Plan of Care Patient           Patient will benefit from skilled therapeutic intervention in order to improve the following deficits and impairments:  Abnormal gait,Pain,Increased fascial restricitons,Decreased mobility,Decreased range of motion,Decreased strength,Increased edema,Difficulty walking  Visit Diagnosis: Chronic pain of left knee  Chronic pain of right knee  Stiffness of left knee, not elsewhere classified  Muscle weakness (generalized)     Problem List Patient Active Problem List   Diagnosis Date Noted  . Allergic rhinitis due to pollen 08/21/2020  . Benign hypertension  08/21/2020  . Genital warts 08/21/2020  . Hematuria 08/21/2020  . Major depression 08/21/2020  . Mixed hyperlipidemia 08/21/2020  . Obesity 08/21/2020  . Somnolence 08/21/2020  . Tobacco abuse 08/21/2020  . Acute medial meniscus tear of left knee 08/21/2020    [facility] [address] [place] PT, DPT 10/18/2020, 2:46 PM  [facility] [address] [place], [place], [place] Phone: [phone]   Fax:  [phone]  Name: [name] MRN: [name] Date of Birth: 1965/09/17

## 2020-10-23 DIAGNOSIS — M1711 Unilateral primary osteoarthritis, right knee: Secondary | ICD-10-CM | POA: Diagnosis not present

## 2020-10-25 DIAGNOSIS — M1712 Unilateral primary osteoarthritis, left knee: Secondary | ICD-10-CM | POA: Diagnosis not present

## 2020-10-26 ENCOUNTER — Ambulatory Visit (HOSPITAL_BASED_OUTPATIENT_CLINIC_OR_DEPARTMENT_OTHER): Payer: 59 | Admitting: Physical Therapy

## 2020-10-29 ENCOUNTER — Other Ambulatory Visit (HOSPITAL_COMMUNITY): Payer: Self-pay

## 2020-10-29 DIAGNOSIS — R7301 Impaired fasting glucose: Secondary | ICD-10-CM | POA: Diagnosis not present

## 2020-10-29 DIAGNOSIS — E782 Mixed hyperlipidemia: Secondary | ICD-10-CM | POA: Diagnosis not present

## 2020-10-29 MED FILL — Metoprolol Succinate Tab ER 24HR 50 MG (Tartrate Equiv): ORAL | 90 days supply | Qty: 90 | Fill #0 | Status: AC

## 2020-10-30 ENCOUNTER — Other Ambulatory Visit (HOSPITAL_COMMUNITY): Payer: Self-pay

## 2020-10-30 MED FILL — Celecoxib Cap 200 MG: ORAL | 30 days supply | Qty: 30 | Fill #0 | Status: AC

## 2020-11-01 DIAGNOSIS — M1712 Unilateral primary osteoarthritis, left knee: Secondary | ICD-10-CM | POA: Diagnosis not present

## 2020-11-02 ENCOUNTER — Other Ambulatory Visit: Payer: Self-pay

## 2020-11-02 ENCOUNTER — Ambulatory Visit (HOSPITAL_BASED_OUTPATIENT_CLINIC_OR_DEPARTMENT_OTHER): Payer: 59 | Admitting: Physical Therapy

## 2020-11-02 DIAGNOSIS — M25561 Pain in right knee: Secondary | ICD-10-CM | POA: Diagnosis not present

## 2020-11-02 DIAGNOSIS — M25662 Stiffness of left knee, not elsewhere classified: Secondary | ICD-10-CM | POA: Diagnosis not present

## 2020-11-02 DIAGNOSIS — G8929 Other chronic pain: Secondary | ICD-10-CM

## 2020-11-02 DIAGNOSIS — M25562 Pain in left knee: Secondary | ICD-10-CM

## 2020-11-02 DIAGNOSIS — M6281 Muscle weakness (generalized): Secondary | ICD-10-CM

## 2020-11-03 ENCOUNTER — Encounter (HOSPITAL_BASED_OUTPATIENT_CLINIC_OR_DEPARTMENT_OTHER): Payer: Self-pay | Admitting: Physical Therapy

## 2020-11-03 NOTE — Addendum Note (Signed)
Addended by: Dessie Coma on: 11/03/2020 08:39 PM   Modules accepted: Orders

## 2020-11-03 NOTE — Therapy (Signed)
[facility] [address] [place], [place], [address] Phone: [phone]   Fax:  [phone]  Physical Therapy Treatment  Patient Details  Name: [name] MRN: [id number] Date of Birth: 23-Dec-1965 Referring Provider (PT): Julien Girt, [place]   Encounter Date: 11/02/2020   PT End of Session - 11/03/20 2017    Visit Number 4    Number of Visits 7    Date for PT Re-Evaluation 10/31/20    Authorization Type [facility]    PT Start Time 1015    PT Stop Time 1058    PT Time Calculation (min) 43 min    Activity Tolerance Patient tolerated treatment well    Behavior During Therapy [facility] for tasks assessed/performed           Past Medical History:  Diagnosis Date  . Acute medial meniscus tear of left knee 08/21/2020  . Allergic rhinitis due to pollen 08/21/2020  . Benign hypertension 08/21/2020  . Genital warts 08/21/2020  . Hematuria 08/21/2020  . Major depression 08/21/2020  . Mixed hyperlipidemia 08/21/2020  . Obesity 08/21/2020  . Sleep apnea   . Somnolence 08/21/2020  . Tobacco abuse 08/21/2020    Past Surgical History:  Procedure Laterality Date  . ANKLE FRACTURE SURGERY    . CESAREAN SECTION    . FRACTURE SURGERY    . HAND SURGERY    . KNEE ARTHROSCOPY WITH MEDIAL MENISECTOMY Left 09/04/2020   Procedure: KNEE ARTHROSCOPY WITH MEDIAL MENISECTOMY;  Surgeon: Salvatore Marvel, MD;  Location: [facility];  Service: Orthopedics;  Laterality: Left;  . ROTATOR CUFF REPAIR    . TUBAL LIGATION      There were no vitals filed for this visit.   Subjective Assessment - 11/03/20 2012    Subjective Patient reports the brace and the tape have not been helpeing. She continues to have signifcant pain as the day goes on. The patient has increased pain as the day goes on. She had another injection with the MD. She doesnt feel like th eoffloading brace is helping.    Limitations Standing;Walking    How long can you stand  comfortably? Manageable    Patient Stated Goals Improve pain and strengthen; lose weight    Currently in Pain? No/denies    Pain Score 3     Pain Location Knee    Pain Orientation Left    Pain Descriptors / Indicators Aching    Pain Type Chronic pain;Surgical pain    Pain Onset More than a month ago    Aggravating Factors  ambulation    Pain Relieving Factors rest, tylenol    Multiple Pain Sites No              [facility] PT Assessment - 11/03/20 0001      AROM   Left Knee Extension -5    Left Knee Flexion 115      Strength   Right Hip Flexion 5/5    Right Hip ABduction 5/5    Left Hip Flexion 4+/5    Left Hip ABduction 4+/5    Right Knee Flexion 5/5    Right Knee Extension 5/5    Left Knee Flexion 4+/5    Left Knee Extension 4/5      Palpation   Palpation comment tender to palpation in the medial tibial                         [facility] Adult  PT Treatment/Exercise - 11/03/20 0001      Knee/Hip Exercises: Aerobic   Nustep 5 min L3      Knee/Hip Exercises: Standing   Heel Raises Limitations x20 with focus on toe placement andweight dshift    Hip Flexion Limitations slow right LE march for left LE wieght bearing 2x10 minor pain flet    Other Standing Knee Exercises TKE 3x10      Knee/Hip Exercises: Supine   Quad Sets Limitations 2x10 5 sec hold    Short Arc Quad Sets Limitations 2x10    Terminal Knee Extension Limitations 2x10    Bridges Limitations 2x10    Straight Leg Raises Limitations 3x10      Manual Therapy   Manual therapy comments Patient was lacking full extension without pain; therpay perfrom PA and AP glides and patient was able to reach full extension without pain    Soft tissue mobilization to posterior knee                  PT Education - 11/03/20 2016    Education Details updated the HEP for more closed chained quad based program    Person(s) Educated Patient    Methods Explanation;Tactile cues;Demonstration;Verbal cues     Comprehension Verbalized understanding;Returned demonstration;Verbal cues required;Tactile cues required               PT Long Term Goals - 10/12/20 1250      PT LONG TERM GOAL #1   Title be independent in advanced HEP for return to wellness program    Time 6    Period Weeks    Status New      PT LONG TERM GOAL #2   Title Pt will report decrease in R knee pain by 50% with activity    Time 6    Period Weeks    Status New      PT LONG TERM GOAL #3   Title Pt will be able to perform functional squat with 25# without pain to demonstrate improved functional strength    Baseline Unable without pain    Time 6    Period Weeks    Status New                 Plan - 11/03/20 2028    Clinical Impression Statement Patient was given closed chained exercises to perfrom at home. She had some difficutly with weight bearing exercises she was advised to scale back on the higher level exercises she is perfroming and focus on basic quad strenthening. She was also avised to continue icing 2-3 times a day.    Personal Factors and Comorbidities Age;Fitness;Comorbidity 1;Time since onset of injury/illness/exacerbation;Profession    Comorbidities OA, medial knee pain, R rotator cuff repair, L shoulder pain, L ankle surgery    Examination-Activity Limitations Squat;Sit;Stairs;Locomotion Level;Lift;Transfers    Examination-Participation Restrictions Community Activity;Occupation;Shop;Yard Work;Cleaning    Stability/Clinical Decision Making Stable/Uncomplicated    Rehab Potential Good    PT Frequency Other (comment)    PT Duration 6 weeks    PT Treatment/Interventions ADLs/Self Care Home Management;Aquatic Therapy;Cryotherapy;Electrical Stimulation;Iontophoresis 4mg /ml Dexamethasone;Moist Heat;Ultrasound;Gait training;Stair training;Functional mobility training;Therapeutic activities;Therapeutic exercise;Balance training;Neuromuscular re-education;Manual techniques;Patient/family education;Passive  range of motion;Taping;Dry needling    PT Next Visit Plan continue to focus on progressive loading in tolerance as well as closed chain exercises.    PT Home Exercise Plan Access Code: [email or web address]    Consulted and Agree with Plan of Care Patient  Patient will benefit from skilled therapeutic intervention in order to improve the following deficits and impairments:  Abnormal gait,Pain,Increased fascial restricitons,Decreased mobility,Decreased range of motion,Decreased strength,Increased edema,Difficulty walking  Visit Diagnosis: Chronic pain of left knee  Chronic pain of right knee  Stiffness of left knee, not elsewhere classified  Muscle weakness (generalized)     Problem List Patient Active Problem List   Diagnosis Date Noted  . Allergic rhinitis due to pollen 08/21/2020  . Benign hypertension 08/21/2020  . Genital warts 08/21/2020  . Hematuria 08/21/2020  . Major depression 08/21/2020  . Mixed hyperlipidemia 08/21/2020  . Obesity 08/21/2020  . Somnolence 08/21/2020  . Tobacco abuse 08/21/2020  . Acute medial meniscus tear of left knee 08/21/2020    Dessie Coma PT DPT  11/03/2020, 8:28 PM  [facility] [address] [place], [place], [address] Phone: [phone]   Fax:  [phone]  Name: [name] MRN: [id number] Date of Birth: [birth year]

## 2020-11-06 DIAGNOSIS — M1712 Unilateral primary osteoarthritis, left knee: Secondary | ICD-10-CM | POA: Diagnosis not present

## 2020-11-16 ENCOUNTER — Other Ambulatory Visit: Payer: Self-pay

## 2020-11-16 ENCOUNTER — Encounter (HOSPITAL_BASED_OUTPATIENT_CLINIC_OR_DEPARTMENT_OTHER): Payer: Self-pay | Admitting: Physical Therapy

## 2020-11-16 ENCOUNTER — Ambulatory Visit (HOSPITAL_BASED_OUTPATIENT_CLINIC_OR_DEPARTMENT_OTHER): Payer: 59 | Attending: Physician Assistant | Admitting: Physical Therapy

## 2020-11-16 DIAGNOSIS — M6281 Muscle weakness (generalized): Secondary | ICD-10-CM | POA: Diagnosis not present

## 2020-11-16 DIAGNOSIS — G8929 Other chronic pain: Secondary | ICD-10-CM

## 2020-11-16 DIAGNOSIS — M25662 Stiffness of left knee, not elsewhere classified: Secondary | ICD-10-CM | POA: Diagnosis not present

## 2020-11-16 DIAGNOSIS — M25562 Pain in left knee: Secondary | ICD-10-CM | POA: Diagnosis not present

## 2020-11-16 DIAGNOSIS — M25561 Pain in right knee: Secondary | ICD-10-CM | POA: Diagnosis not present

## 2020-11-16 NOTE — Therapy (Signed)
[facility] [address] [place], [place], [address] Phone: [phone]   Fax:  [phone]  Physical Therapy Treatment  Patient Details  Name: [name] MRN: [id number] Date of Birth: Jan 05, 1966 Referring Provider (PT): Julien Girt, [place]   Encounter Date: 11/16/2020   PT End of Session - 11/16/20 1306    Visit Number 5    Number of Visits 16    Date for PT Re-Evaluation 12/15/20    Authorization Type [facility]    PT Start Time 1300    PT Stop Time 1348    PT Time Calculation (min) 48 min    Activity Tolerance Patient tolerated treatment well    Behavior During Therapy [facility] for tasks assessed/performed           Past Medical History:  Diagnosis Date  . Acute medial meniscus tear of left knee 08/21/2020  . Allergic rhinitis due to pollen 08/21/2020  . Benign hypertension 08/21/2020  . Genital warts 08/21/2020  . Hematuria 08/21/2020  . Major depression 08/21/2020  . Mixed hyperlipidemia 08/21/2020  . Obesity 08/21/2020  . Sleep apnea   . Somnolence 08/21/2020  . Tobacco abuse 08/21/2020    Past Surgical History:  Procedure Laterality Date  . ANKLE FRACTURE SURGERY    . CESAREAN SECTION    . FRACTURE SURGERY    . HAND SURGERY    . KNEE ARTHROSCOPY WITH MEDIAL MENISECTOMY Left 09/04/2020   Procedure: KNEE ARTHROSCOPY WITH MEDIAL MENISECTOMY;  Surgeon: Salvatore Marvel, MD;  Location: [facility];  Service: Orthopedics;  Laterality: Left;  . ROTATOR CUFF REPAIR    . TUBAL LIGATION      There were no vitals filed for this visit.   Subjective Assessment - 11/16/20 1301    Subjective today is bearable but yesterday it was an 8-9/10. wearing compression brace which is most helpful.    Patient Stated Goals Improve pain and strengthen; lose weight    Currently in Pain? Yes    Pain Score 3     Pain Location Knee    Pain Orientation Left    Pain Descriptors / Indicators Discomfort    Aggravating  Factors  weight bearing    Pain Relieving Factors meds              [facility] PT Assessment - 11/16/20 0001      AROM   Left Knee Extension 3   greater than 0 extension                        [facility] Adult PT Treatment/Exercise - 11/16/20 0001      Therapeutic Activites    Therapeutic Activities Lifting    Lifting transfers for grandmother      Knee/Hip Exercises: Aerobic   Nustep 5 min L3      Knee/Hip Exercises: Machines for Strengthening   Other Machine shuttle presses- 25, neutral double & single leg, ER & ER with midpoint holds- 12+6      Knee/Hip Exercises: Supine   Straight Leg Raises Limitations 2x10 without touching table      Manual Therapy   McConnell X and HS tracing on post knee      Kinesiotix   Create Space to support patellar glide from medial aspect                       PT Long Term Goals - 10/12/20 1250  PT LONG TERM GOAL #1   Title be independent in advanced HEP for return to wellness program    Time 6    Period Weeks    Status New      PT LONG TERM GOAL #2   Title Pt will report decrease in R knee pain by 50% with activity    Time 6    Period Weeks    Status New      PT LONG TERM GOAL #3   Title Pt will be able to perform functional squat with 25# without pain to demonstrate improved functional strength    Baseline Unable without pain    Time 6    Period Weeks    Status New                 Plan - 11/16/20 1653    Clinical Impression Statement tolerated exercises well today without increase in pain levels. requested that we trial the taping again today and will see how she feels. asked her to schedule 2/week moving fwd with 1/week in the pool as her schedule allows.    PT Treatment/Interventions ADLs/Self Care Home Management;Aquatic Therapy;Cryotherapy;Electrical Stimulation;Iontophoresis 4mg /ml Dexamethasone;Moist Heat;Ultrasound;Gait training;Stair training;Functional mobility training;Therapeutic  activities;Therapeutic exercise;Balance training;Neuromuscular re-education;Manual techniques;Patient/family education;Passive range of motion;Taping;Dry needling    PT Next Visit Plan cont CKC strengthening    PT Home Exercise Plan Access Code: [email or web address]    Consulted and Agree with Plan of Care Patient           Patient will benefit from skilled therapeutic intervention in order to improve the following deficits and impairments:  Abnormal gait,Pain,Increased fascial restricitons,Decreased mobility,Decreased range of motion,Decreased strength,Increased edema,Difficulty walking  Visit Diagnosis: Chronic pain of left knee  Chronic pain of right knee  Stiffness of left knee, not elsewhere classified  Muscle weakness (generalized)     Problem List Patient Active Problem List   Diagnosis Date Noted  . Allergic rhinitis due to pollen 08/21/2020  . Benign hypertension 08/21/2020  . Genital warts 08/21/2020  . Hematuria 08/21/2020  . Major depression 08/21/2020  . Mixed hyperlipidemia 08/21/2020  . Obesity 08/21/2020  . Somnolence 08/21/2020  . Tobacco abuse 08/21/2020  . Acute medial meniscus tear of left knee 08/21/2020    Quetzalli Clos C. Sila Sarsfield PT, DPT 11/16/20 4:57 PM   [facility] [address] [place], [place], [place] Phone: [phone]   Fax:  [phone]  Name: [name] MRN: [name] Date of Birth: 06-22-1966

## 2020-11-27 ENCOUNTER — Encounter (HOSPITAL_BASED_OUTPATIENT_CLINIC_OR_DEPARTMENT_OTHER): Payer: Self-pay | Admitting: Physical Therapy

## 2020-11-27 ENCOUNTER — Ambulatory Visit (HOSPITAL_BASED_OUTPATIENT_CLINIC_OR_DEPARTMENT_OTHER): Payer: 59 | Admitting: Physical Therapy

## 2020-11-27 ENCOUNTER — Other Ambulatory Visit: Payer: Self-pay

## 2020-11-27 DIAGNOSIS — M1712 Unilateral primary osteoarthritis, left knee: Secondary | ICD-10-CM | POA: Diagnosis not present

## 2020-11-27 DIAGNOSIS — G8929 Other chronic pain: Secondary | ICD-10-CM | POA: Diagnosis not present

## 2020-11-27 DIAGNOSIS — M6281 Muscle weakness (generalized): Secondary | ICD-10-CM

## 2020-11-27 DIAGNOSIS — M25562 Pain in left knee: Secondary | ICD-10-CM | POA: Diagnosis not present

## 2020-11-27 DIAGNOSIS — M25561 Pain in right knee: Secondary | ICD-10-CM | POA: Diagnosis not present

## 2020-11-27 DIAGNOSIS — M25662 Stiffness of left knee, not elsewhere classified: Secondary | ICD-10-CM

## 2020-11-27 NOTE — Therapy (Signed)
[facility] [address] [place], [place], [address] Phone: [phone]   Fax:  [phone]  Physical Therapy Treatment  Patient Details  Name: [name] MRN: [id number] Date of Birth: December 18, 1965 Referring Provider (PT): Julien Girt, [place]   Encounter Date: 11/27/2020   PT End of Session - 11/27/20 1644    Visit Number 6    Number of Visits 16    Date for PT Re-Evaluation 12/15/20    Authorization Type [facility]    PT Start Time 1537    PT Stop Time 1615    PT Time Calculation (min) 38 min    Equipment Utilized During Treatment Other (comment)   foam belt and cuffs, kick board, noodle/sqoodle   Activity Tolerance Patient tolerated treatment well    Behavior During Therapy [facility] for tasks assessed/performed           Past Medical History:  Diagnosis Date  . Acute medial meniscus tear of left knee 08/21/2020  . Allergic rhinitis due to pollen 08/21/2020  . Benign hypertension 08/21/2020  . Genital warts 08/21/2020  . Hematuria 08/21/2020  . Major depression 08/21/2020  . Mixed hyperlipidemia 08/21/2020  . Obesity 08/21/2020  . Sleep apnea   . Somnolence 08/21/2020  . Tobacco abuse 08/21/2020    Past Surgical History:  Procedure Laterality Date  . ANKLE FRACTURE SURGERY    . CESAREAN SECTION    . FRACTURE SURGERY    . HAND SURGERY    . KNEE ARTHROSCOPY WITH MEDIAL MENISECTOMY Left 09/04/2020   Procedure: KNEE ARTHROSCOPY WITH MEDIAL MENISECTOMY;  Surgeon: Salvatore Marvel, MD;  Location: [facility];  Service: Orthopedics;  Laterality: Left;  . ROTATOR CUFF REPAIR    . TUBAL LIGATION      There were no vitals filed for this visit.   Subjective Assessment - 11/27/20 1642    Subjective My pain has finally been better over the last week, down to a 1-2.  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 Yes    Pain Score 1     Pain  Location Knee    Pain Orientation Left    Pain Descriptors / Indicators Aching    Pain Type Surgical pain    Pain Onset More than a month ago    Aggravating Factors  full extension, climbing stairs    Pain Relieving Factors meds    Multiple Pain Sites No           TREATMENT     Pt seen for aquatic therapy today.  Treatment took place in water 3.25-4.8 ft in depth at the [facility] pool. Temp of water was 91.  Pt entered/exited the pool via stairs step to pattern independently with bilat rail.  Warm up: forward, backward and side stepping/walking cues for increased step length, increased speed, hand placement to increase resistance.  Seated water bench 75% submersion Pt completed seated LE AROM exercises 2 x 10 reps knee flex/ext with an without foam cuff for resistance.  VC for df for gastroc stretch  Standing Stretching of hamstrings, gastroc against wall, hip flex and LB stretches facilitated with use of noodle and foam ankle cuff and adductors Pt completes LE exercises: Hip and knee noodle pulls  x 10 bilat,  knee ext noodle pushdowns ue supported on wall 2x10.  Decreased ue support to holding to barbells for added core  strengthening and balance challenge. Step ups on bottom step holding bilat to handrails leading with each LE 's 2 x 10 reps, vc for eccentric control.   Pt requires buoyancy for support and to offload joints with strengthening exercises. Viscosity of the water is needed for resistance of strengthening; water current perturbations provides challenge to standing balance unsupported, requiring increased core activation                           PT Long Term Goals - 10/12/20 1250      PT LONG TERM GOAL #1   Title be independent in advanced HEP for return to wellness program    Time 6    Period Weeks    Status New      PT LONG TERM GOAL #2   Title Pt will report decrease in R knee pain by 50% with activity    Time 6     Period Weeks    Status New      PT LONG TERM GOAL #3   Title Pt will be able to perform functional squat with 25# without pain to demonstrate improved functional strength    Baseline Unable without pain    Time 6    Period Weeks    Status New                 Plan - 11/27/20 1646    Clinical Impression Statement Pt comofrtable in aquatic setting.  Tolerates all activity without pain although c/o "tightness" at joint line under patella medially. Plnning on returning to work next week.    Personal Factors and Comorbidities Age;Fitness;Comorbidity 1;Time since onset of injury/illness/exacerbation;Profession    Comorbidities OA, medial knee pain, R rotator cuff repair, L shoulder pain, L ankle surgery    Examination-Activity Limitations Squat;Sit;Stairs;Locomotion Level;Lift;Transfers    Examination-Participation Restrictions Community Activity;Occupation;Shop;Yard Work;Cleaning    Stability/Clinical Decision Making Stable/Uncomplicated    Rehab Potential Good    PT Frequency 2x / week    PT Duration 6 weeks    PT Treatment/Interventions ADLs/Self Care Home Management;Aquatic Therapy;Cryotherapy;Electrical Stimulation;Iontophoresis 4mg /ml Dexamethasone;Moist Heat;Ultrasound;Gait training;Stair training;Functional mobility training;Therapeutic activities;Therapeutic exercise;Balance training;Neuromuscular re-education;Manual techniques;Patient/family education;Passive range of motion;Taping;Dry needling    PT Next Visit Plan Will see dave for aquatic session, advance strengthening    PT Home Exercise Plan Access Code: [email or web address]    Consulted and Agree with Plan of Care Patient           Patient will benefit from skilled therapeutic intervention in order to improve the following deficits and impairments:  Abnormal gait,Pain,Increased fascial restricitons,Decreased mobility,Decreased range of motion,Decreased strength,Increased edema,Difficulty walking  Visit Diagnosis: Chronic pain of  left knee  Chronic pain of right knee  Stiffness of left knee, not elsewhere classified  Muscle weakness (generalized)     Problem List Patient Active Problem List   Diagnosis Date Noted  . Allergic rhinitis due to pollen 08/21/2020  . Benign hypertension 08/21/2020  . Genital warts 08/21/2020  . Hematuria 08/21/2020  . Major depression 08/21/2020  . Mixed hyperlipidemia 08/21/2020  . Obesity 08/21/2020  . Somnolence 08/21/2020  . Tobacco abuse 08/21/2020  . Acute medial meniscus tear of left knee 08/21/2020    08/23/2020 MPT 11/27/2020, 4:50 PM  [facility] [address] [place], [place], [place] Phone: [phone]   Fax:  [phone]  Name: [name] MRN: [name] Date of Birth: 07/20/1965

## 2020-11-30 ENCOUNTER — Ambulatory Visit (HOSPITAL_BASED_OUTPATIENT_CLINIC_OR_DEPARTMENT_OTHER): Payer: 59 | Admitting: Physical Therapy

## 2020-11-30 ENCOUNTER — Encounter (HOSPITAL_BASED_OUTPATIENT_CLINIC_OR_DEPARTMENT_OTHER): Payer: Self-pay | Admitting: Physical Therapy

## 2020-11-30 ENCOUNTER — Other Ambulatory Visit: Payer: Self-pay

## 2020-11-30 DIAGNOSIS — M6281 Muscle weakness (generalized): Secondary | ICD-10-CM | POA: Diagnosis not present

## 2020-11-30 DIAGNOSIS — G8929 Other chronic pain: Secondary | ICD-10-CM | POA: Diagnosis not present

## 2020-11-30 DIAGNOSIS — M25562 Pain in left knee: Secondary | ICD-10-CM | POA: Diagnosis not present

## 2020-11-30 DIAGNOSIS — M25662 Stiffness of left knee, not elsewhere classified: Secondary | ICD-10-CM | POA: Diagnosis not present

## 2020-11-30 DIAGNOSIS — M25561 Pain in right knee: Secondary | ICD-10-CM | POA: Diagnosis not present

## 2020-11-30 NOTE — Therapy (Signed)
[facility] [address] [place], [place], [address] Phone: [phone]   Fax:  [phone]  Physical Therapy Treatment  Patient Details  Name: ONESTY CLAIR MRN: [id number] Date of Birth: [birth year] Referring Provider (PT): Julien Girt, [place]   Encounter Date: 11/30/2020   PT End of Session - 11/30/20 1323    Visit Number 7    Number of Visits 16    Date for PT Re-Evaluation 12/15/20    Authorization Type [facility]    PT Start Time [id number]   therapist slow at actual check in   PT Stop Time 1342    PT Time Calculation (min) 39 min    Activity Tolerance Patient tolerated treatment well    Behavior During Therapy [facility] for tasks assessed/performed           Past Medical History:  Diagnosis Date  . Acute medial meniscus tear of left knee 08/21/2020  . Allergic rhinitis due to pollen 08/21/2020  . Benign hypertension 08/21/2020  . Genital warts 08/21/2020  . Hematuria 08/21/2020  . Major depression 08/21/2020  . Mixed hyperlipidemia 08/21/2020  . Obesity 08/21/2020  . Sleep apnea   . Somnolence 08/21/2020  . Tobacco abuse 08/21/2020    Past Surgical History:  Procedure Laterality Date  . ANKLE FRACTURE SURGERY    . CESAREAN SECTION    . FRACTURE SURGERY    . HAND SURGERY    . KNEE ARTHROSCOPY WITH MEDIAL MENISECTOMY Left 09/04/2020   Procedure: KNEE ARTHROSCOPY WITH MEDIAL MENISECTOMY;  Surgeon: Salvatore Marvel, MD;  Location: [facility];  Service: Orthopedics;  Laterality: Left;  . ROTATOR CUFF REPAIR    . TUBAL LIGATION      There were no vitals filed for this visit.   Subjective Assessment - 11/30/20 1308    Subjective Patient reports her knee has been doing better. her pain level is a 1-2 at this time. She will be returning back to work in 1 week. She has been doing a lot of caregiving as well.    Limitations Standing;Walking    How long can you sit comfortably? n/a    How long can you stand  comfortably? Manageable    Currently in Pain? Yes    Pain Score 2     Pain Location Knee    Pain Orientation Left    Pain Descriptors / Indicators Aching    Pain Type Chronic pain    Pain Onset More than a month ago    Pain Frequency Intermittent    Aggravating Factors  standin and walking    Pain Relieving Factors meds    Multiple Pain Sites No              [facility] PT Assessment - 11/30/20 0001      Assessment   Medical Diagnosis Knee arthroscopy with medial menisectomy    Referring Provider (PT) Julien Girt, PA-C                         [facility] Adult PT Treatment/Exercise - 11/30/20 0001      Knee/Hip Exercises: Standing   Other Standing Knee Exercises standing heel raise x20; step onto air-ex 2x10; 2 inch step up 2x10; slow march 2x10      Knee/Hip Exercises: Supine   Quad Sets Limitations 2x15    Short Arc Quad Sets Limitations 2x10    Bridges Limitations 2x10    Straight Leg Raises Limitations 2x10 without  touching table      Manual Therapy   Manual Therapy Passive ROM    Soft tissue mobilization to posterior knee    Passive ROM gentle PROM into extension                  PT Education - 11/30/20 1323    Education Details reviewed HEP and symptom mangement    Person(s) Educated Patient    Methods Explanation;Demonstration;Tactile cues;Verbal cues    Comprehension Verbalized understanding;Returned demonstration;Verbal cues required;Tactile cues required               PT Long Term Goals - 10/12/20 1250      PT LONG TERM GOAL #1   Title be independent in advanced HEP for return to wellness program    Time 6    Period Weeks    Status New      PT LONG TERM GOAL #2   Title Pt will report decrease in R knee pain by 50% with activity    Time 6    Period Weeks    Status New      PT LONG TERM GOAL #3   Title Pt will be able to perform functional squat with 25# without pain to demonstrate improved functional strength     Baseline Unable without pain    Time 6    Period Weeks    Status New                 Plan - 11/30/20 1325    Clinical Impression Statement Pateitn is mking good progress. Her extension is progressing very well. She is nearly down to the table. Shecan fully extend but it takes effort and is uncomfortbale. She tolerated ther-ex well today. She has had trouble walking outside. The patient was given instability training and did well. She was unable to do a 4 inch step. She was able to do a 2 inch step. Therapy will continue to progress fucntional training as tolerated. She is going to continue to work in the pool on her own as well.    Personal Factors and Comorbidities Age;Fitness;Comorbidity 1;Time since onset of injury/illness/exacerbation;Profession    Comorbidities OA, medial knee pain, R rotator cuff repair, L shoulder pain, L ankle surgery    Examination-Activity Limitations Squat;Sit;Stairs;Locomotion Level;Lift;Transfers    Examination-Participation Restrictions Community Activity;Occupation;Shop;Yard Work;Cleaning    Stability/Clinical Decision Making Stable/Uncomplicated    Clinical Decision Making Low    Rehab Potential Good    PT Frequency 2x / week    PT Duration 6 weeks    PT Treatment/Interventions ADLs/Self Care Home Management;Aquatic Therapy;Cryotherapy;Electrical Stimulation;Iontophoresis 4mg /ml Dexamethasone;Moist Heat;Ultrasound;Gait training;Stair training;Functional mobility training;Therapeutic activities;Therapeutic exercise;Balance training;Neuromuscular re-education;Manual techniques;Patient/family education;Passive range of motion;Taping;Dry needling    PT Next Visit Plan Will see dave for aquatic session, advance strengthening    PT Home Exercise Plan Access Code: [email or web address]    Consulted and Agree with Plan of Care Patient           Patient will benefit from skilled therapeutic intervention in order to improve the following deficits and impairments:  Abnormal  gait,Pain,Increased fascial restricitons,Decreased mobility,Decreased range of motion,Decreased strength,Increased edema,Difficulty walking  Visit Diagnosis: Chronic pain of left knee  Chronic pain of right knee  Stiffness of left knee, not elsewhere classified  Muscle weakness (generalized)     Problem List Patient Active Problem List   Diagnosis Date Noted  . Allergic rhinitis due to pollen 08/21/2020  . Benign hypertension 08/21/2020  . Genital warts 08/21/2020  .  Hematuria 08/21/2020  . Major depression 08/21/2020  . Mixed hyperlipidemia 08/21/2020  . Obesity 08/21/2020  . Somnolence 08/21/2020  . Tobacco abuse 08/21/2020  . Acute medial meniscus tear of left knee 08/21/2020    Dessie Coma PT DPT   11/30/2020, 2:26 PM  [facility] [address] [place], [place], [address] Phone: [phone]   Fax:  [phone]  Name: ATHALIE NEWHARD MRN: [id number] Date of Birth: Mar 05, 1966

## 2020-12-04 ENCOUNTER — Encounter (HOSPITAL_BASED_OUTPATIENT_CLINIC_OR_DEPARTMENT_OTHER): Payer: Self-pay | Admitting: Physical Therapy

## 2020-12-04 ENCOUNTER — Ambulatory Visit (HOSPITAL_BASED_OUTPATIENT_CLINIC_OR_DEPARTMENT_OTHER): Payer: 59 | Admitting: Physical Therapy

## 2020-12-04 ENCOUNTER — Other Ambulatory Visit: Payer: Self-pay

## 2020-12-04 DIAGNOSIS — G8929 Other chronic pain: Secondary | ICD-10-CM

## 2020-12-04 DIAGNOSIS — M25562 Pain in left knee: Secondary | ICD-10-CM | POA: Diagnosis not present

## 2020-12-04 DIAGNOSIS — M25561 Pain in right knee: Secondary | ICD-10-CM | POA: Diagnosis not present

## 2020-12-04 DIAGNOSIS — M6281 Muscle weakness (generalized): Secondary | ICD-10-CM | POA: Diagnosis not present

## 2020-12-04 DIAGNOSIS — M25662 Stiffness of left knee, not elsewhere classified: Secondary | ICD-10-CM

## 2020-12-04 NOTE — Therapy (Signed)
[facility] [address] [place], [place], [address] Phone: [phone]   Fax:  [phone]  Physical Therapy Treatment  Patient Details  Name: [name] MRN: [id number] Date of Birth: 1965-09-09 Referring Provider (PT): Julien Girt, [place]   Encounter Date: 12/04/2020   PT End of Session - 12/04/20 1742    Visit Number 8    Number of Visits 16    Date for PT Re-Evaluation 12/15/20    Authorization Type [facility]    PT Start Time 1451    PT Stop Time 1540    PT Time Calculation (min) 49 min    Equipment Utilized During Treatment Other (comment)    Activity Tolerance Patient tolerated treatment well    Behavior During Therapy [facility] for tasks assessed/performed           Past Medical History:  Diagnosis Date  . Acute medial meniscus tear of left knee 08/21/2020  . Allergic rhinitis due to pollen 08/21/2020  . Benign hypertension 08/21/2020  . Genital warts 08/21/2020  . Hematuria 08/21/2020  . Major depression 08/21/2020  . Mixed hyperlipidemia 08/21/2020  . Obesity 08/21/2020  . Sleep apnea   . Somnolence 08/21/2020  . Tobacco abuse 08/21/2020    Past Surgical History:  Procedure Laterality Date  . ANKLE FRACTURE SURGERY    . CESAREAN SECTION    . FRACTURE SURGERY    . HAND SURGERY    . KNEE ARTHROSCOPY WITH MEDIAL MENISECTOMY Left 09/04/2020   Procedure: KNEE ARTHROSCOPY WITH MEDIAL MENISECTOMY;  Surgeon: Salvatore Marvel, MD;  Location: [facility];  Service: Orthopedics;  Laterality: Left;  . ROTATOR CUFF REPAIR    . TUBAL LIGATION      There were no vitals filed for this visit.   Subjective Assessment - 12/04/20 1741    Subjective Pt reports knee is tight.  Not really pain other than walking steps.    Limitations Standing;Walking    How long can you stand comfortably? Manageable          Treatment   Pt seen for aquatic therapy today.  Treatment took place in water 3.25-4.8 ft in  depth at the [facility] pool. Temp of water was 91.  Pt entered/exited the pool via stairs step to pattern independently with bilat rail.  Warm up: forward, backward and side stepping/walking cues for increased step length, increased speed, hand placement to increase resistance.  Standing Stretching of hamstrings, gastroc against wall, hip flex and LB stretches facilitated with use of noodle and foam ankle cuff and adductors Pt completes LE exercises: Hip and knee noodle pulls  x 10 bilat,  knee ext noodle pushdowns ue supported on wall 2x10.  Decreased ue support to holding to barbells for added core strengthening and balance challenge. Step ups on bottom step (~8 inch then 16 inch) holding bilat to handrails leading with each LE 's 2 x 10 reps forward facing, back step up 8 inch step x 10 reps, side step x 10 to left, vc for eccentric control. Pt with mild discomfort with back step which subsided with repositioning  Aerobic capacity training Suspended by noodles between legs pt at intervals increases kick for 20 secs then rest for 40 secs forward kick x 5 mins then backward kick x 5 mins.   Pt requires buoyancy for support and to offload joints with strengthening exercises. Viscosity of the water is needed for resistance of strengthening; water current perturbations provides challenge to standing balance  unsupported, requiring increased core activation                                PT Long Term Goals - 10/12/20 1250      PT LONG TERM GOAL #1   Title be independent in advanced HEP for return to wellness program    Time 6    Period Weeks    Status New      PT LONG TERM GOAL #2   Title Pt will report decrease in R knee pain by 50% with activity    Time 6    Period Weeks    Status New      PT LONG TERM GOAL #3   Title Pt will be able to perform functional squat with 25# without pain to demonstrate improved functional strength    Baseline  Unable without pain    Time 6    Period Weeks    Status New                 Plan - 12/04/20 1748    Clinical Impression Statement Pt ready to return to work. Complains of tightness in knee.  Added aerobic capacity training today completing intervals with concentration on heart rate and knee strength/mobility. Pt completes step ups 50% emmersed ~8 inch step withoout discomfort.    Personal Factors and Comorbidities Age;Fitness;Comorbidity 1;Time since onset of injury/illness/exacerbation;Profession    Comorbidities OA, medial knee pain, R rotator cuff repair, L shoulder pain, L ankle surgery    Examination-Activity Limitations Squat;Sit;Stairs;Locomotion Level;Lift;Transfers    Examination-Participation Restrictions Community Activity;Occupation;Shop;Yard Work;Cleaning    PT Treatment/Interventions ADLs/Self Care Home Management;Aquatic Therapy;Cryotherapy;Electrical Stimulation;Iontophoresis 4mg /ml Dexamethasone;Moist Heat;Ultrasound;Gait training;Stair training;Functional mobility training;Therapeutic activities;Therapeutic exercise;Balance training;Neuromuscular re-education;Manual techniques;Patient/family education;Passive range of motion;Taping;Dry needling    PT Next Visit Plan Aerobic capcity training progression    PT Home Exercise Plan Access Code: [email or web address]    Consulted and Agree with Plan of Care Patient           Patient will benefit from skilled therapeutic intervention in order to improve the following deficits and impairments:  Abnormal gait,Pain,Increased fascial restricitons,Decreased mobility,Decreased range of motion,Decreased strength,Increased edema,Difficulty walking  Visit Diagnosis: Chronic pain of left knee  Chronic pain of right knee  Stiffness of left knee, not elsewhere classified  Muscle weakness (generalized)     Problem List Patient Active Problem List   Diagnosis Date Noted  . Allergic rhinitis due to pollen 08/21/2020  . Benign  hypertension 08/21/2020  . Genital warts 08/21/2020  . Hematuria 08/21/2020  . Major depression 08/21/2020  . Mixed hyperlipidemia 08/21/2020  . Obesity 08/21/2020  . Somnolence 08/21/2020  . Tobacco abuse 08/21/2020  . Acute medial meniscus tear of left knee 08/21/2020    08/23/2020 MPT 12/04/2020, 5:56 PM  [facility] [address] [place], [place], [place] Phone: [phone]   Fax:  [phone]  Name: SYNDA BAGENT MRN: [name] Date of Birth: 1966-03-23

## 2020-12-05 ENCOUNTER — Other Ambulatory Visit (HOSPITAL_COMMUNITY): Payer: Self-pay

## 2020-12-05 MED FILL — Celecoxib Cap 200 MG: ORAL | 30 days supply | Qty: 30 | Fill #1 | Status: AC

## 2020-12-10 ENCOUNTER — Encounter (HOSPITAL_BASED_OUTPATIENT_CLINIC_OR_DEPARTMENT_OTHER): Payer: Self-pay | Admitting: Physical Therapy

## 2020-12-10 ENCOUNTER — Ambulatory Visit (HOSPITAL_BASED_OUTPATIENT_CLINIC_OR_DEPARTMENT_OTHER): Payer: 59 | Attending: Physician Assistant | Admitting: Physical Therapy

## 2020-12-10 ENCOUNTER — Other Ambulatory Visit: Payer: Self-pay

## 2020-12-10 DIAGNOSIS — M25662 Stiffness of left knee, not elsewhere classified: Secondary | ICD-10-CM | POA: Insufficient documentation

## 2020-12-10 DIAGNOSIS — M25562 Pain in left knee: Secondary | ICD-10-CM | POA: Insufficient documentation

## 2020-12-10 DIAGNOSIS — M25561 Pain in right knee: Secondary | ICD-10-CM | POA: Insufficient documentation

## 2020-12-10 DIAGNOSIS — G8929 Other chronic pain: Secondary | ICD-10-CM | POA: Insufficient documentation

## 2020-12-10 DIAGNOSIS — M6281 Muscle weakness (generalized): Secondary | ICD-10-CM | POA: Diagnosis not present

## 2020-12-10 NOTE — Therapy (Signed)
[facility] [address] [place], [place], [address] Phone: [phone]   Fax:  [phone]  Physical Therapy Treatment  Patient Details  Name: [name] MRN: [id number] Date of Birth: 1966/03/15 Referring Provider (PT): Julien Girt, [place]   Encounter Date: 12/10/2020   PT End of Session - 12/10/20 1406    Visit Number 9    Number of Visits 16    Date for PT Re-Evaluation 01/21/21    Authorization Type [facility]    PT Start Time 1306   Patient not checked in   PT Stop Time 1345    PT Time Calculation (min) 39 min    Activity Tolerance Patient tolerated treatment well    Behavior During Therapy [facility] for tasks assessed/performed           Past Medical History:  Diagnosis Date  . Acute medial meniscus tear of left knee 08/21/2020  . Allergic rhinitis due to pollen 08/21/2020  . Benign hypertension 08/21/2020  . Genital warts 08/21/2020  . Hematuria 08/21/2020  . Major depression 08/21/2020  . Mixed hyperlipidemia 08/21/2020  . Obesity 08/21/2020  . Sleep apnea   . Somnolence 08/21/2020  . Tobacco abuse 08/21/2020    Past Surgical History:  Procedure Laterality Date  . ANKLE FRACTURE SURGERY    . CESAREAN SECTION    . FRACTURE SURGERY    . HAND SURGERY    . KNEE ARTHROSCOPY WITH MEDIAL MENISECTOMY Left 09/04/2020   Procedure: KNEE ARTHROSCOPY WITH MEDIAL MENISECTOMY;  Surgeon: Salvatore Marvel, MD;  Location: [facility];  Service: Orthopedics;  Laterality: Left;  . ROTATOR CUFF REPAIR    . TUBAL LIGATION      There were no vitals filed for this visit.   Subjective Assessment - 12/10/20 1404    Subjective Patient went back to work. She reports since she did her knee has been stiff and sore. She will do a session in the pool toda.    Limitations Standing;Walking    How long can you sit comfortably? n/a    How long can you stand comfortably? Manageable    Patient Stated Goals Improve pain and  strengthen; lose weight    Currently in Pain? Yes    Pain Score 2     Pain Location Knee    Pain Orientation Right;Left    Pain Descriptors / Indicators Aching    Pain Type Chronic pain    Pain Onset More than a month ago    Pain Frequency Intermittent              [facility] PT Assessment - 12/10/20 0001      Assessment   Medical Diagnosis Knee arthroscopy with medial menisectomy    Referring Provider (PT) Shepperson, Kirstin, PA-C      AROM   Left Knee Extension --   pain with full extension   Left Knee Flexion 115      Strength   Right Hip Flexion 5/5    Left Hip Extension 4+/5    Right Knee Flexion 5/5    Right Knee Extension 5/5    Left Knee Flexion 5/5    Left Knee Extension 4+/5      Palpation   Palpation comment improved patella mobility                                      PT Long  Term Goals - 12/10/20 1417      PT LONG TERM GOAL #1   Title be independent in advanced HEP for return to wellness program    Baseline continues to work on a pool program    Time 6    Period Weeks    Status On-going      PT LONG TERM GOAL #2   Title Pt will report decrease in R knee pain by 50% with activity    Baseline still painful at work    Time 6    Period Weeks    Status On-going      PT LONG TERM GOAL #3   Title Pt will be able to perform functional squat with 25# without pain to demonstrate improved functional strength    Baseline Unable without pain    Time 6    Period Weeks    Status On-going                 Plan - 12/10/20 1412    Clinical Impression Statement Patient tolerated treatment well. She was able to complete stair training and general LE strengthening without increased pain. She reported fatigue twoards the end. Overall she is making progress. She had increased pain and swelling at work. We will continue 1W6 to work on functional strengthening and endruance training. She is still lacking minor strength and full pain  free knee extension.    Personal Factors and Comorbidities Age;Fitness;Comorbidity 1;Time since onset of injury/illness/exacerbation;Profession    Comorbidities OA, medial knee pain, R rotator cuff repair, L shoulder pain, L ankle surgery    Examination-Activity Limitations Squat;Sit;Stairs;Locomotion Level;Lift;Transfers    Examination-Participation Restrictions Community Activity;Occupation;Shop;Yard Work;Cleaning    Stability/Clinical Decision Making Stable/Uncomplicated    Clinical Decision Making Low    Rehab Potential Good    PT Frequency 2x / week    PT Duration 6 weeks    PT Treatment/Interventions ADLs/Self Care Home Management;Aquatic Therapy;Cryotherapy;Electrical Stimulation;Iontophoresis 4mg /ml Dexamethasone;Moist Heat;Ultrasound;Gait training;Stair training;Functional mobility training;Therapeutic activities;Therapeutic exercise;Balance training;Neuromuscular re-education;Manual techniques;Patient/family education;Passive range of motion;Taping;Dry needling    PT Next Visit Plan continue to progress functional strengthening    PT Home Exercise Plan Access Code: [email or web address]    Consulted and Agree with Plan of Care Patient           Patient will benefit from skilled therapeutic intervention in order to improve the following deficits and impairments:  Abnormal gait,Pain,Increased fascial restricitons,Decreased mobility,Decreased range of motion,Decreased strength,Increased edema,Difficulty walking  Visit Diagnosis: Chronic pain of left knee  Chronic pain of right knee  Stiffness of left knee, not elsewhere classified  Muscle weakness (generalized)    Pt seen for aquatic therapy today.  Treatment took place in water 3.25-4 ft in depth at the pool. Temp of water was 91.  Pt entered/exited the pool via stairs (step through pattern) independently with bilat rail.      Warm up: heel/toe walking x4 laps across pool chest deep  side stepping x4 laps from  shallow to deep;  Long stride 4 laps   Exercises; Slow march x20;  Kick back x20; Kick to the side;  squats x20; hip extension x20; hip abduction x20; Sit to stand x20;  Sitting kick out x20 each leg   Bicycle with pool noodle   Step up/down/ side step 20   Sit to stand 2x15     Pt requires buoyancy for support and to offload joints with strengthening exercises. Viscosity of the water is needed for resistance of strengthening;  water current perturbations provides challenge to standing balance unsupported, requiring increased core activation.     Problem List Patient Active Problem List   Diagnosis Date Noted  . Allergic rhinitis due to pollen 08/21/2020  . Benign hypertension 08/21/2020  . Genital warts 08/21/2020  . Hematuria 08/21/2020  . Major depression 08/21/2020  . Mixed hyperlipidemia 08/21/2020  . Obesity 08/21/2020  . Somnolence 08/21/2020  . Tobacco abuse 08/21/2020  . Acute medial meniscus tear of left knee 08/21/2020    Dessie Coma 12/10/2020, 2:18 PM  [facility] [address] [place], [place], [address] Phone: [phone]   Fax:  [phone]  Name: [name] MRN: [id number] Date of Birth: [birth year]

## 2020-12-20 ENCOUNTER — Other Ambulatory Visit: Payer: Self-pay

## 2020-12-20 ENCOUNTER — Ambulatory Visit (HOSPITAL_BASED_OUTPATIENT_CLINIC_OR_DEPARTMENT_OTHER): Payer: 59 | Admitting: Physical Therapy

## 2020-12-20 ENCOUNTER — Encounter (HOSPITAL_BASED_OUTPATIENT_CLINIC_OR_DEPARTMENT_OTHER): Payer: Self-pay | Admitting: Physical Therapy

## 2020-12-20 DIAGNOSIS — M25562 Pain in left knee: Secondary | ICD-10-CM

## 2020-12-20 DIAGNOSIS — G8929 Other chronic pain: Secondary | ICD-10-CM

## 2020-12-20 DIAGNOSIS — M6281 Muscle weakness (generalized): Secondary | ICD-10-CM

## 2020-12-20 DIAGNOSIS — M25561 Pain in right knee: Secondary | ICD-10-CM

## 2020-12-20 DIAGNOSIS — M25662 Stiffness of left knee, not elsewhere classified: Secondary | ICD-10-CM

## 2020-12-21 NOTE — Therapy (Signed)
[facility] [address] [place], [place], [address] Phone: [phone]   Fax:  [phone]  Physical Therapy Treatment  Patient Details  Name: [name] MRN: [id number] Date of Birth: Jan 28, 1966 Referring Provider (PT): Julien Girt, [place]   Encounter Date: 12/20/2020   PT End of Session - 12/21/20 1351     Visit Number 10    Number of Visits 16    Date for PT Re-Evaluation 01/21/21    Authorization Type [place]    PT Start Time 0930    PT Stop Time 1012    PT Time Calculation (min) 42 min    Activity Tolerance Patient tolerated treatment well    Behavior During Therapy [facility] for tasks assessed/performed             Past Medical History:  Diagnosis Date   Acute medial meniscus tear of left knee 08/21/2020   Allergic rhinitis due to pollen 08/21/2020   Benign hypertension 08/21/2020   Genital warts 08/21/2020   Hematuria 08/21/2020   Major depression 08/21/2020   Mixed hyperlipidemia 08/21/2020   Obesity 08/21/2020   Sleep apnea    Somnolence 08/21/2020   Tobacco abuse 08/21/2020    Past Surgical History:  Procedure Laterality Date   ANKLE FRACTURE SURGERY     CESAREAN SECTION     FRACTURE SURGERY     HAND SURGERY     KNEE ARTHROSCOPY WITH MEDIAL MENISECTOMY Left 09/04/2020   Procedure: KNEE ARTHROSCOPY WITH MEDIAL MENISECTOMY;  Surgeon: Salvatore Marvel, MD;  Location: [facility];  Service: Orthopedics;  Laterality: Left;   ROTATOR CUFF REPAIR     TUBAL LIGATION      There were no vitals filed for this visit.   Subjective Assessment - 12/20/20 0950     Subjective Pt worked 2 12 hour shifts and had increased pain (6-7/10) and swelling.  She used ice and advil which helped.  Pt reports she felt much better after prior Rx.    Limitations Standing;Walking    How long can you sit comfortably? n/a    How long can you stand comfortably? Manageable    Patient Stated Goals Improve pain and  strengthen; lose weight    Currently in Pain? Yes    Pain Score 3     Pain Location Knee    Pain Orientation Left;Other (Comment)   anterior   Pain Descriptors / Indicators Aching    Pain Type Chronic pain    Pain Onset 1 to 4 weeks ago    Pain Frequency Intermittent    Aggravating Factors  standing and walking    Pain Relieving Factors ice, advil    Effect of Pain on Daily Activities difficutly perfroming work tasks                   Warm up: heel/toe walking x4 laps across pool chest deep side stepping x4 laps from shallow to deep;   Exercises; Slow march x20; hip 3 way x20; squats x20; hip extension x20; hip abduction x20; Sit to stand x20;  board trunk flexion x10 lateral board rotation x10 each way seated   Steps step up x20 each leg; lateral step up x20 each leg;   Noodle push and pull x20 with core breathing; noodle press x20 with core breathing   Backwards walking 2x10   Single leg stance 3x20 sec hold  PT Education - 12/20/20 1041     Education Details Educated pt concerning benefits of water for stair training.    Person(s) Educated Patient    Methods Explanation    Comprehension Verbalized understanding                 PT Long Term Goals - 12/10/20 1417       PT LONG TERM GOAL #1   Title be independent in advanced HEP for return to wellness program    Baseline continues to work on a pool program    Time 6    Period Weeks    Status On-going      PT LONG TERM GOAL #2   Title Pt will report decrease in R knee pain by 50% with activity    Baseline still painful at work    Time 6    Period Weeks    Status On-going      PT LONG TERM GOAL #3   Title Pt will be able to perform functional squat with 25# without pain to demonstrate improved functional strength    Baseline Unable without pain    Time 6    Period Weeks    Status On-going                   Plan - 12/20/20 1015     Clinical  Impression Statement Pt performed exercises well including stability exercises in the pool.  She tolerated the aquatic exercises well without c/o's except mild discomfort with step downs.  Pt responded well to Rx having no increase in pain after Rx. Therapy added single leg stance.    Comorbidities OA, medial knee pain, R rotator cuff repair, L shoulder pain, L ankle surgery    Examination-Activity Limitations Squat;Sit;Stairs;Locomotion Level;Lift;Transfers    Examination-Participation Restrictions Community Activity;Occupation;Shop;Yard Work;Cleaning    Stability/Clinical Decision Making Stable/Uncomplicated    Clinical Decision Making Low    Rehab Potential Good    PT Frequency 2x / week    PT Duration 6 weeks    PT Treatment/Interventions ADLs/Self Care Home Management;Aquatic Therapy;Cryotherapy;Electrical Stimulation;Iontophoresis 4mg /ml Dexamethasone;Moist Heat;Ultrasound;Gait training;Stair training;Functional mobility training;Therapeutic activities;Therapeutic exercise;Balance training;Neuromuscular re-education;Manual techniques;Patient/family education;Passive range of motion;Taping;Dry needling    PT Next Visit Plan continue to progress functional strengthening    PT Home Exercise Plan Access Code: [email or web address]    Consulted and Agree with Plan of Care Patient             Patient will benefit from skilled therapeutic intervention in order to improve the following deficits and impairments:  Abnormal gait, Pain, Increased fascial restricitons, Decreased mobility, Decreased range of motion, Decreased strength, Increased edema, Difficulty walking  Visit Diagnosis: Chronic pain of left knee  Chronic pain of right knee  Stiffness of left knee, not elsewhere classified  Muscle weakness (generalized)     Problem List Patient Active Problem List   Diagnosis Date Noted   Allergic rhinitis due to pollen 08/21/2020   Benign hypertension 08/21/2020   Genital warts 08/21/2020    Hematuria 08/21/2020   Major depression 08/21/2020   Mixed hyperlipidemia 08/21/2020   Obesity 08/21/2020   Somnolence 08/21/2020   Tobacco abuse 08/21/2020   Acute medial meniscus tear of left knee 08/21/2020    08/23/2020 PT DPT  12/21/2020, 1:55 PM  [facility] [address] [place], [place], [place] Phone: [phone]   Fax:  [phone]  Name: [name] MRN: [name] Date of Birth: 01-30-1966

## 2020-12-25 ENCOUNTER — Other Ambulatory Visit: Payer: Self-pay

## 2020-12-25 ENCOUNTER — Encounter (HOSPITAL_BASED_OUTPATIENT_CLINIC_OR_DEPARTMENT_OTHER): Payer: Self-pay | Admitting: Physical Therapy

## 2020-12-25 ENCOUNTER — Ambulatory Visit (HOSPITAL_BASED_OUTPATIENT_CLINIC_OR_DEPARTMENT_OTHER): Payer: 59 | Admitting: Physical Therapy

## 2020-12-25 DIAGNOSIS — M25561 Pain in right knee: Secondary | ICD-10-CM | POA: Diagnosis not present

## 2020-12-25 DIAGNOSIS — M25562 Pain in left knee: Secondary | ICD-10-CM | POA: Diagnosis not present

## 2020-12-25 DIAGNOSIS — M6281 Muscle weakness (generalized): Secondary | ICD-10-CM | POA: Diagnosis not present

## 2020-12-25 DIAGNOSIS — M25662 Stiffness of left knee, not elsewhere classified: Secondary | ICD-10-CM | POA: Diagnosis not present

## 2020-12-25 DIAGNOSIS — G8929 Other chronic pain: Secondary | ICD-10-CM | POA: Diagnosis not present

## 2020-12-26 NOTE — Therapy (Signed)
[facility] [address] [place], [place], [address] Phone: [phone]   Fax:  [phone]  Physical Therapy Treatment  Patient Details  Name: [name] MRN: [id number] Date of Birth: [birth year] Referring Provider (PT): Julien Girt, [place]   Encounter Date: 12/25/2020   PT End of Session - 12/26/20 1552     Visit Number 11    Number of Visits 16    Date for PT Re-Evaluation 01/21/21    Authorization Type [facility]    PT Start Time 1300    PT Stop Time 1340    PT Time Calculation (min) 40 min    Equipment Utilized During Treatment Other (comment)    Activity Tolerance Patient tolerated treatment well    Behavior During Therapy [facility] for tasks assessed/performed             Past Medical History:  Diagnosis Date   Acute medial meniscus tear of left knee 08/21/2020   Allergic rhinitis due to pollen 08/21/2020   Benign hypertension 08/21/2020   Genital warts 08/21/2020   Hematuria 08/21/2020   Major depression 08/21/2020   Mixed hyperlipidemia 08/21/2020   Obesity 08/21/2020   Sleep apnea    Somnolence 08/21/2020   Tobacco abuse 08/21/2020    Past Surgical History:  Procedure Laterality Date   ANKLE FRACTURE SURGERY     CESAREAN SECTION     FRACTURE SURGERY     HAND SURGERY     KNEE ARTHROSCOPY WITH MEDIAL MENISECTOMY Left 09/04/2020   Procedure: KNEE ARTHROSCOPY WITH MEDIAL MENISECTOMY;  Surgeon: Salvatore Marvel, MD;  Location: [facility];  Service: Orthopedics;  Laterality: Left;   ROTATOR CUFF REPAIR     TUBAL LIGATION      There were no vitals filed for this visit.   Subjective Assessment - 12/25/20 1305     Subjective Pt worked 1 - 12 hour shift increased pain after long shift (3/4). Walking up stairs is very uncomfortable. She used ice and elvation prior to visit that helped bring pain to 3.    Limitations Standing;Walking    How long can you sit comfortably? n/a    How long can you  stand comfortably? Manageable    How long can you walk comfortably? Walking up [place], downhill. aswell as stair climbing is painful.    Patient Stated Goals Improve pain and strengthen; lose weight    Currently in Pain? Yes    Pain Score 3     Pain Location Knee    Pain Orientation Left    Pain Descriptors / Indicators Aching    Pain Type Chronic pain    Pain Onset 1 to 4 weeks ago    Pain Frequency Intermittent    Aggravating Factors  prolonged walking, standing, stairs    Pain Relieving Factors ice, rest, advil    Effect of Pain on Daily Activities difficulty performing work tasks.    Multiple Pain Sites No                               [facility] Adult PT Treatment/Exercise - 12/26/20 0001       Knee/Hip Exercises: Supine   Quad Sets Limitations 2x15    Short Arc Quad Sets Limitations 2x10    Bridges Limitations 2x10    Straight Leg Raises Limitations 2x10 without touching table      Modalities   Modalities Vasopneumatic  Vasopneumatic   Number Minutes Vasopneumatic  15 minutes    Vasopnuematic Location  Knee    Vasopneumatic Pressure Medium    Vasopneumatic Temperature  34 degrees      Manual Therapy   Manual therapy comments edema massge with leg elevated    Soft tissue mobilization to posterior knee    Passive ROM gentle PROM into extension      Kinesiotix   [employer] Space suportive inferior and superior taping; edema taping                    PT Education - 12/26/20 1551     Education Details reviewed RICE and symptom management    Person(s) Educated Patient    Methods Explanation;Demonstration;Tactile cues;Verbal cues    Comprehension Verbalized understanding;Returned demonstration;Verbal cues required;Tactile cues required                 PT Long Term Goals - 12/10/20 1417       PT LONG TERM GOAL #1   Title be independent in advanced HEP for return to wellness program    Baseline continues to work on a pool program     Time 6    Period Weeks    Status On-going      PT LONG TERM GOAL #2   Title Pt will report decrease in R knee pain by 50% with activity    Baseline still painful at work    Time 6    Period Weeks    Status On-going      PT LONG TERM GOAL #3   Title Pt will be able to perform functional squat with 25# without pain to demonstrate improved functional strength    Baseline Unable without pain    Time 6    Period Weeks    Status On-going                   Plan - 12/25/20 1314     Clinical Impression Statement Paatient tolerated treatment well (massage to move swelling away from knee, ap joint mobilzation of knee, and soft tissue mobilization of quadriceps). Overall she is not making much progress. She continues to have significant pain and swelling when she works. She is working 3 days a week. She is unable to do many of her exercises. She was advised at some point she needs to go back and see her MD. Therapy trialed vasopnematic device    Personal Factors and Comorbidities Age;Fitness;Comorbidity 1;Time since onset of injury/illness/exacerbation;Profession    Comorbidities OA, medial knee pain, R rotator cuff repair, L shoulder pain, L ankle surgery    Examination-Activity Limitations Squat;Sit;Stairs;Locomotion Level;Lift;Transfers    Examination-Participation Restrictions Community Activity;Occupation;Shop;Yard Work;Cleaning    Stability/Clinical Decision Making Stable/Uncomplicated    Clinical Decision Making Low    Rehab Potential Good    PT Frequency 2x / week    PT Duration 6 weeks    PT Treatment/Interventions ADLs/Self Care Home Management;Aquatic Therapy;Cryotherapy;Electrical Stimulation;Iontophoresis 4mg /ml Dexamethasone;Moist Heat;Ultrasound;Gait training;Stair training;Functional mobility training;Therapeutic activities;Therapeutic exercise;Balance training;Neuromuscular re-education;Manual techniques;Patient/family education;Passive range of motion;Taping;Dry  needling    PT Next Visit Plan continue to progress functional strengthening    PT Home Exercise Plan Access Code: [email or web address]    Consulted and Agree with Plan of Care Patient             Patient will benefit from skilled therapeutic intervention in order to improve the following deficits and impairments:  Abnormal gait, Pain, Increased fascial restricitons, Decreased mobility, Decreased  range of motion, Decreased strength, Increased edema, Difficulty walking  Visit Diagnosis: Chronic pain of left knee  Chronic pain of right knee  Stiffness of left knee, not elsewhere classified  Muscle weakness (generalized)     Problem List Patient Active Problem List   Diagnosis Date Noted   Allergic rhinitis due to pollen 08/21/2020   Benign hypertension 08/21/2020   Genital warts 08/21/2020   Hematuria 08/21/2020   Major depression 08/21/2020   Mixed hyperlipidemia 08/21/2020   Obesity 08/21/2020   Somnolence 08/21/2020   Tobacco abuse 08/21/2020   Acute medial meniscus tear of left knee 08/21/2020    Dessie Coma PT DPT  12/26/2020, 4:00 PM  [facility] [address] [place], [place], [address] Phone: [phone]   Fax:  [phone]  Name: [name] MRN: [id number] Date of Birth: [birth year]

## 2021-01-01 ENCOUNTER — Ambulatory Visit (HOSPITAL_BASED_OUTPATIENT_CLINIC_OR_DEPARTMENT_OTHER): Payer: 59 | Admitting: Physical Therapy

## 2021-01-01 ENCOUNTER — Encounter (HOSPITAL_BASED_OUTPATIENT_CLINIC_OR_DEPARTMENT_OTHER): Payer: Self-pay | Admitting: Physical Therapy

## 2021-01-01 ENCOUNTER — Other Ambulatory Visit: Payer: Self-pay

## 2021-01-01 DIAGNOSIS — M6281 Muscle weakness (generalized): Secondary | ICD-10-CM

## 2021-01-01 DIAGNOSIS — M25561 Pain in right knee: Secondary | ICD-10-CM | POA: Diagnosis not present

## 2021-01-01 DIAGNOSIS — G8929 Other chronic pain: Secondary | ICD-10-CM | POA: Diagnosis not present

## 2021-01-01 DIAGNOSIS — M25662 Stiffness of left knee, not elsewhere classified: Secondary | ICD-10-CM | POA: Diagnosis not present

## 2021-01-01 DIAGNOSIS — M25562 Pain in left knee: Secondary | ICD-10-CM | POA: Diagnosis not present

## 2021-01-01 NOTE — Therapy (Signed)
[facility] [address] [place], [place], [address] Phone: [phone]   Fax:  [phone]  Physical Therapy Treatment  Patient Details  Name: [name] MRN: [id number] Date of Birth: [birth year] Referring Provider (PT): Julien Girt, [place]   Encounter Date: 01/01/2021   PT End of Session - 01/01/21 1348     Visit Number 12    Number of Visits 16    Date for PT Re-Evaluation 01/21/21    Authorization Type [facility]    PT Start Time 0848    PT Stop Time 0942    PT Time Calculation (min) 54 min    Activity Tolerance Patient tolerated treatment well    Behavior During Therapy [facility] for tasks assessed/performed             Past Medical History:  Diagnosis Date   Acute medial meniscus tear of left knee 08/21/2020   Allergic rhinitis due to pollen 08/21/2020   Benign hypertension 08/21/2020   Genital warts 08/21/2020   Hematuria 08/21/2020   Major depression 08/21/2020   Mixed hyperlipidemia 08/21/2020   Obesity 08/21/2020   Sleep apnea    Somnolence 08/21/2020   Tobacco abuse 08/21/2020    Past Surgical History:  Procedure Laterality Date   ANKLE FRACTURE SURGERY     CESAREAN SECTION     FRACTURE SURGERY     HAND SURGERY     KNEE ARTHROSCOPY WITH MEDIAL MENISECTOMY Left 09/04/2020   Procedure: KNEE ARTHROSCOPY WITH MEDIAL MENISECTOMY;  Surgeon: Salvatore Marvel, MD;  Location: [facility];  Service: Orthopedics;  Laterality: Left;   ROTATOR CUFF REPAIR     TUBAL LIGATION      There were no vitals filed for this visit.   Subjective Assessment - 01/01/21 0854     Subjective Pt stated she felt better after prior Rx.  pt states she felt the best after the massage and ice/gameready after prior Rx.  Pt has worked 3 12 hour shifts with 2 shifts being back to back.  Pt states she felt better after work having minimal pain and less swelling.  She feels she has made progress since last visit.  Pt states the  pocket of swelling in lateral knee is less.    How long can you sit comfortably? pt reports sitting doesn't bother her.    How long can you stand comfortably? Manageable    How long can you walk comfortably? Walking up [place], downhill. as well as stair climbing is painful.    Currently in Pain? Yes    Pain Score 1     Pain Location Knee   L ant/inf knee   Pain Orientation Left    Pain Descriptors / Indicators Dull    Aggravating Factors  walking uphill and downhill, stairs, general walking    Pain Relieving Factors ice, rest    Effect of Pain on Daily Activities pain with performing work pain tasks    Multiple Pain Sites No                [facility] PT Assessment - 01/01/21 0001       Observation/Other Assessments-Edema    Edema Circumferential   Taken before gameready and after gameready.  Mid patella:43.6 cm prior and 43.0 cm after gameready.     ROM / Strength   AROM / PROM / Strength AROM      AROM   Left Knee Extension --   Full knee extension   Left  Knee Flexion 128                           [facility] Adult PT Treatment/Exercise - 01/01/21 0001       Exercises   Exercises --   Reviewed response to prior Rx, current function and symptoms, home management strategies, and pain level.     Knee/Hip Exercises: Stretches   Other Knee/Hip Stretches Standing gastroc stretch 2x20"      Knee/Hip Exercises: Aerobic   Other Aerobic Attempted Nustep and sci fit bike for approx 1 minute each though stopped due to pt discomfort      Knee/Hip Exercises: Standing   Other Standing Knee Exercises standing heel raises 2x10 reps, marching on airex with light UE assist and without UE assist 3x10 reps      Knee/Hip Exercises: Supine   Quad Sets Limitations 2x10 with 5" hold    Short Arc Quad Sets Limitations 2x10    Bridges Limitations 2x10    Straight Leg Raises 2 sets;10 reps      Knee/Hip Exercises: Sidelying   Hip ABduction 2 sets;10 reps      Modalities    Modalities Vasopneumatic      Vasopneumatic   Number Minutes Vasopneumatic  15 minutes    Vasopnuematic Location  Knee    Vasopneumatic Pressure Medium    Vasopneumatic Temperature  34 degrees      Manual Therapy   Manual Therapy Soft tissue mobilization    Manual therapy comments Pt received gentle STM to distal quad primarily and distal to mid quad seated at EOT with leg propped.                    PT Education - 01/01/21 1345     Education Details Answered Pt's questions.  Educated pt's concerning objective findings of knee AROM and girth measurements pre and post gamready.    Person(s) Educated Patient    Methods Explanation    Comprehension Verbalized understanding                 PT Long Term Goals - 12/10/20 1417       PT LONG TERM GOAL #1   Title be independent in advanced HEP for return to wellness program    Baseline continues to work on a pool program    Time 6    Period Weeks    Status On-going      PT LONG TERM GOAL #2   Title Pt will report decrease in R knee pain by 50% with activity    Baseline still painful at work    Time 6    Period Weeks    Status On-going      PT LONG TERM GOAL #3   Title Pt will be able to perform functional squat with 25# without pain to demonstrate improved functional strength    Baseline Unable without pain    Time 6    Period Weeks    Status On-going                   Plan - 01/01/21 1409     Clinical Impression Statement Pt presents to Rx today reporting she is feeling much better and having no pain.  She had a signifcantly improved response to therapy after prior Rx and reports improved response to work over her past 3 shifts.  Pt reports improved sx's after MT and gameready.  Slowly progressed  land ther ex and pt performed exercises well.  Pt had fairly immediate discomfort with Nustep and bike and PT stopped those activities.  Pt has good L knee AROM.  Girth measurements were taken before and  after vasopneumatic compression and Pt demonstrated reduced swelling.  Pt responded well to Rx having no pain and complaints after Rx.    Comorbidities OA, medial knee pain, R rotator cuff repair, L shoulder pain, L ankle surgery    PT Treatment/Interventions ADLs/Self Care Home Management;Aquatic Therapy;Cryotherapy;Electrical Stimulation;Iontophoresis 4mg /ml Dexamethasone;Moist Heat;Ultrasound;Gait training;Stair training;Functional mobility training;Therapeutic activities;Therapeutic exercise;Balance training;Neuromuscular re-education;Manual techniques;Patient/family education;Passive range of motion;Taping;Dry needling    PT Next Visit Plan Slowly progress land ther ex per pt tolerance.  cont with manual techniques and gameready.    PT Home Exercise Plan Access Code: [email or web address]    Consulted and Agree with Plan of Care Patient             Patient will benefit from skilled therapeutic intervention in order to improve the following deficits and impairments:  Abnormal gait, Pain, Increased fascial restricitons, Decreased mobility, Decreased range of motion, Decreased strength, Increased edema, Difficulty walking  Visit Diagnosis: Chronic pain of left knee  Muscle weakness (generalized)  Stiffness of left knee, not elsewhere classified     Problem List Patient Active Problem List   Diagnosis Date Noted   Allergic rhinitis due to pollen 08/21/2020   Benign hypertension 08/21/2020   Genital warts 08/21/2020   Hematuria 08/21/2020   Major depression 08/21/2020   Mixed hyperlipidemia 08/21/2020   Obesity 08/21/2020   Somnolence 08/21/2020   Tobacco abuse 08/21/2020   Acute medial meniscus tear of left knee 08/21/2020    08/23/2020 III PT, DPT 01/01/2021, 5:17 PM  [facility] [address] [place], [place], [place] Phone: [phone]   Fax:  [phone]  Name: [name] MRN: [name] Date of Birth:  1966/05/31

## 2021-01-03 DIAGNOSIS — H5213 Myopia, bilateral: Secondary | ICD-10-CM | POA: Diagnosis not present

## 2021-01-08 DIAGNOSIS — M1711 Unilateral primary osteoarthritis, right knee: Secondary | ICD-10-CM | POA: Diagnosis not present

## 2021-01-08 DIAGNOSIS — M1712 Unilateral primary osteoarthritis, left knee: Secondary | ICD-10-CM | POA: Diagnosis not present

## 2021-01-09 ENCOUNTER — Ambulatory Visit (HOSPITAL_BASED_OUTPATIENT_CLINIC_OR_DEPARTMENT_OTHER): Payer: 59 | Attending: Physician Assistant | Admitting: Physical Therapy

## 2021-01-09 ENCOUNTER — Encounter (HOSPITAL_BASED_OUTPATIENT_CLINIC_OR_DEPARTMENT_OTHER): Payer: Self-pay | Admitting: Physical Therapy

## 2021-01-09 ENCOUNTER — Other Ambulatory Visit: Payer: Self-pay

## 2021-01-09 DIAGNOSIS — M6281 Muscle weakness (generalized): Secondary | ICD-10-CM

## 2021-01-09 DIAGNOSIS — M25662 Stiffness of left knee, not elsewhere classified: Secondary | ICD-10-CM | POA: Diagnosis not present

## 2021-01-09 DIAGNOSIS — M25562 Pain in left knee: Secondary | ICD-10-CM | POA: Diagnosis not present

## 2021-01-09 DIAGNOSIS — G8929 Other chronic pain: Secondary | ICD-10-CM | POA: Diagnosis not present

## 2021-01-09 NOTE — Therapy (Signed)
[facility] [address] [place], [place], [address] Phone: [phone]   Fax:  [phone]  Physical Therapy Treatment  Patient Details  Name: [name] MRN: [id number] Date of Birth: [birth year] Referring Provider (PT): Julien Girt, [place]   Encounter Date: 01/09/2021   PT End of Session - 01/09/21 1106     Visit Number 13    Number of Visits 16    Date for PT Re-Evaluation 01/21/21    Authorization Type [facility]    PT Start Time 1052    PT Stop Time 1150    PT Time Calculation (min) 58 min    Activity Tolerance Patient tolerated treatment well    Behavior During Therapy [facility] for tasks assessed/performed             Past Medical History:  Diagnosis Date   Acute medial meniscus tear of left knee 08/21/2020   Allergic rhinitis due to pollen 08/21/2020   Benign hypertension 08/21/2020   Genital warts 08/21/2020   Hematuria 08/21/2020   Major depression 08/21/2020   Mixed hyperlipidemia 08/21/2020   Obesity 08/21/2020   Sleep apnea    Somnolence 08/21/2020   Tobacco abuse 08/21/2020    Past Surgical History:  Procedure Laterality Date   ANKLE FRACTURE SURGERY     CESAREAN SECTION     FRACTURE SURGERY     HAND SURGERY     KNEE ARTHROSCOPY WITH MEDIAL MENISECTOMY Left 09/04/2020   Procedure: KNEE ARTHROSCOPY WITH MEDIAL MENISECTOMY;  Surgeon: Salvatore Marvel, MD;  Location: [facility];  Service: Orthopedics;  Laterality: Left;   ROTATOR CUFF REPAIR     TUBAL LIGATION      There were no vitals filed for this visit.   Subjective Assessment - 01/09/21 1100     Subjective Patient states that she feels same as last week. She still feels pain with walking, biking, and standing. No pain with rest. Patient reports that she will be able to have breaks between work days soon. She states that she is eager to progress her exercise.    Pertinent History knee arthroscopy    Limitations Standing;Walking     How long can you sit comfortably? pt reports sitting doesn't bother her.    How long can you stand comfortably? Manageable    How long can you walk comfortably? Walking up [place], downhill. as well as stair climbing is painful.    Patient Stated Goals Improve pain and strengthen; lose weight    Currently in Pain? Yes    Pain Score 3     Pain Location Knee    Pain Orientation Left    Pain Descriptors / Indicators Aching;Dull    Pain Type Chronic pain    Pain Onset 1 to 4 weeks ago    Pain Frequency Intermittent    Aggravating Factors  walking uphill and downhill, general walking    Pain Relieving Factors ice, rest    Effect of Pain on Daily Activities pain with performing work tasks    Multiple Pain Sites No                               [facility] Adult PT Treatment/Exercise - 01/09/21 0001       Exercises   Exercises --   Reviewed response to prior Rx, current function and symptoms, home management strategies, and pain level.     Knee/Hip Exercises: Stretches  Other Knee/Hip Stretches Standing gastroc stretch 2x20"    Other Knee/Hip Stretches piriformis stretch 2x20 sec hold ; SKTC stretch 3x20 sec hold; hamstring stretch seated 2x20 sec hold      Knee/Hip Exercises: Aerobic   Other Aerobic Attempted Nustep and sci fit bike for approx 1 minute each though stopped due to pt discomfort      Knee/Hip Exercises: Standing   Other Standing Knee Exercises standing heel raises 2x10 reps, marching on airex with light UE assist and without UE assist 3x10 reps      Knee/Hip Exercises: Supine   Quad Sets Limitations 2x15 5 sec hold    Bridges Limitations 2x10    Straight Leg Raises 2 sets;10 reps      Knee/Hip Exercises: Sidelying   Hip ABduction 2 sets;10 reps      Modalities   Modalities Vasopneumatic      Vasopneumatic   Number Minutes Vasopneumatic  15 minutes    Vasopnuematic Location  Knee    Vasopneumatic Pressure Medium    Vasopneumatic Temperature  34  degrees      Manual Therapy   Manual Therapy Soft tissue mobilization;Joint mobilization    Manual therapy comments edema massge with leg elevated    Joint Mobilization grade II and III PA and AP mobilizations to decrease pain    Soft tissue mobilization to posterior knee                    PT Education - 01/09/21 1104     Education Details Patient education on home exercise plan and need for progressing plan.    Person(s) Educated Patient    Methods Explanation    Comprehension Verbalized understanding                 PT Long Term Goals - 12/10/20 1417       PT LONG TERM GOAL #1   Title be independent in advanced HEP for return to wellness program    Baseline continues to work on a pool program    Time 6    Period Weeks    Status On-going      PT LONG TERM GOAL #2   Title Pt will report decrease in R knee pain by 50% with activity    Baseline still painful at work    Time 6    Period Weeks    Status On-going      PT LONG TERM GOAL #3   Title Pt will be able to perform functional squat with 25# without pain to demonstrate improved functional strength    Baseline Unable without pain    Time 6    Period Weeks    Status On-going                   Plan - 01/09/21 1329     Clinical Impression Statement Patient continues to have pain and swelling but we continue to advance her activity. She has a consitnet amount of pain despite moving activity forward. We added leg press today. She has been going to the gym. She reports that the MD states she has significant OA and she is going to have pain. Therapy performed manual therapy for swellign and mobiltiy. Her patella motion is limited so we continue to work on thaat as well.    Personal Factors and Comorbidities Age;Fitness;Comorbidity 1;Time since onset of injury/illness/exacerbation;Profession    Comorbidities OA, medial knee pain, R rotator cuff repair, L shoulder pain, L  ankle surgery     Examination-Activity Limitations Squat;Sit;Stairs;Locomotion Level;Lift;Transfers    Examination-Participation Restrictions Community Activity;Occupation;Shop;Yard Work;Cleaning    Stability/Clinical Decision Making Stable/Uncomplicated    Clinical Decision Making Low    Rehab Potential Good    PT Frequency 2x / week    PT Duration 6 weeks    PT Treatment/Interventions ADLs/Self Care Home Management;Aquatic Therapy;Cryotherapy;Electrical Stimulation;Iontophoresis 4mg /ml Dexamethasone;Moist Heat;Ultrasound;Gait training;Stair training;Functional mobility training;Therapeutic activities;Therapeutic exercise;Balance training;Neuromuscular re-education;Manual techniques;Patient/family education;Passive range of motion;Taping;Dry needling    PT Next Visit Plan Slowly progress land ther ex per pt tolerance.  cont with manual techniques and gameready.    PT Home Exercise Plan Access Code: [email or web address]    Consulted and Agree with Plan of Care Patient             Patient will benefit from skilled therapeutic intervention in order to improve the following deficits and impairments:  Abnormal gait, Pain, Increased fascial restricitons, Decreased mobility, Decreased range of motion, Decreased strength, Increased edema, Difficulty walking  Visit Diagnosis: Chronic pain of left knee  Muscle weakness (generalized)  Stiffness of left knee, not elsewhere classified     Problem List Patient Active Problem List   Diagnosis Date Noted   Allergic rhinitis due to pollen 08/21/2020   Benign hypertension 08/21/2020   Genital warts 08/21/2020   Hematuria 08/21/2020   Major depression 08/21/2020   Mixed hyperlipidemia 08/21/2020   Obesity 08/21/2020   Somnolence 08/21/2020   Tobacco abuse 08/21/2020   Acute medial meniscus tear of left knee 08/21/2020    08/23/2020 PT DPT  01/09/2021, 1:37 PM  [facility] [address] [place], [place],  [place] Phone: [phone]   Fax:  [phone]  Name: [name] MRN: [name] Date of Birth: 01-28-1966

## 2021-01-16 ENCOUNTER — Ambulatory Visit (HOSPITAL_BASED_OUTPATIENT_CLINIC_OR_DEPARTMENT_OTHER): Payer: 59 | Admitting: Physical Therapy

## 2021-01-16 ENCOUNTER — Other Ambulatory Visit: Payer: 59

## 2021-01-24 ENCOUNTER — Other Ambulatory Visit: Payer: Self-pay

## 2021-01-24 ENCOUNTER — Ambulatory Visit (HOSPITAL_BASED_OUTPATIENT_CLINIC_OR_DEPARTMENT_OTHER): Payer: 59 | Admitting: Physical Therapy

## 2021-01-24 DIAGNOSIS — M6281 Muscle weakness (generalized): Secondary | ICD-10-CM | POA: Diagnosis not present

## 2021-01-24 DIAGNOSIS — M25662 Stiffness of left knee, not elsewhere classified: Secondary | ICD-10-CM

## 2021-01-24 DIAGNOSIS — G8929 Other chronic pain: Secondary | ICD-10-CM | POA: Diagnosis not present

## 2021-01-24 DIAGNOSIS — M25562 Pain in left knee: Secondary | ICD-10-CM | POA: Diagnosis not present

## 2021-01-25 NOTE — Therapy (Addendum)
[place], [place], [address] Phone: [phone]   Fax:  [phone]  Physical Therapy Treatment/Discharge   Patient Details  Name: [name] MRN: [id number] Date of Birth: 1966/02/04 Referring Provider (PT): Matthew Saras, [place]   Encounter Date: 01/24/2021   PT End of Session - 01/24/21 1224     Visit Number 14    Number of Visits 16    Date for PT Re-Evaluation 01/21/21    Authorization Type [employer]    PT Start Time 1143    PT Stop Time 1228    PT Time Calculation (min) 45 min    Activity Tolerance Patient tolerated treatment well    Behavior During Therapy [facility] for tasks assessed/performed             Past Medical History:  Diagnosis Date   Acute medial meniscus tear of left knee 08/21/2020   Allergic rhinitis due to pollen 08/21/2020   Benign hypertension 08/21/2020   Genital warts 08/21/2020   Hematuria 08/21/2020   Major depression 08/21/2020   Mixed hyperlipidemia 08/21/2020   Obesity 08/21/2020   Sleep apnea    Somnolence 08/21/2020   Tobacco abuse 08/21/2020    Past Surgical History:  Procedure Laterality Date   ANKLE FRACTURE SURGERY     CESAREAN SECTION     FRACTURE SURGERY     HAND SURGERY     KNEE ARTHROSCOPY WITH MEDIAL MENISECTOMY Left 09/04/2020   Procedure: KNEE ARTHROSCOPY WITH MEDIAL MENISECTOMY;  Surgeon: Elsie Saas, MD;  Location: [place];  Service: Orthopedics;  Laterality: Left;   ROTATOR CUFF REPAIR     TUBAL LIGATION      There were no vitals filed for this visit.   Subjective Assessment - 01/24/21 1152     Subjective Patient states that her knee feels the same as last week. She still has pain with walking, standing, biking, going up stairs. Patient states that her pain is tolerable with her knee brace but still painfull. She states that she is going to schedule visit with MD to discuss her progress.    Pertinent History knee  arthroscopy    Limitations Standing;Walking    How long can you sit comfortably? pt reports sitting doesn't bother her.    How long can you stand comfortably? Manageable    How long can you walk comfortably? Walking up [place], downhill. as well as stair climbing is painful.    Patient Stated Goals Improve pain and strengthen; lose weight    Currently in Pain? Yes    Pain Score 3     Pain Location Knee    Pain Orientation Left    Pain Descriptors / Indicators Aching;Dull    Pain Type Chronic pain    Pain Onset 1 to 4 weeks ago    Aggravating Factors  walking uphill rest and ice    Pain Relieving Factors ice and rest    Effect of Pain on Daily Activities pain while perfroming tasks                [facility] PT Assessment - 01/25/21 0001       Assessment   Medical Diagnosis Knee arthroscopy with medial menisectomy    Referring Provider (PT) Shepperson, Kirstin, PA-C      AROM   Left Knee Extension 0    Left Knee Flexion 128      Strength   Right Knee Flexion 5/5    Right Knee  Extension 5/5    Left Knee Flexion 5/5    Left Knee Extension 4+/5                           [facility] Adult PT Treatment/Exercise - 01/25/21 0001       Self-Care   Self-Care Other Self-Care Comments    Other Self-Care Comments  reviewed exercise progression going forward; reviewed symptom mangement at work and home. Reviewed the improtance of pre-hab depending on the current course that is taken with the patient.      Knee/Hip Exercises: Aerobic   Other Aerobic nu-step L 1 mild pain noted 5 min      Knee/Hip Exercises: Standing   Hip Flexion Stengthening;Both;2 sets;10 reps    Hip Abduction Stengthening;Both;2 sets;10 reps    Hip Extension Stengthening;Both;2 sets;10 reps      Knee/Hip Exercises: Supine   Quad Sets Limitations 3x10 5 sec hold    Short Arc Quad Sets Limitations 3x10    Bridges Limitations 2x12    Straight Leg Raises 20 reps      Knee/Hip Exercises: Sidelying    Hip ABduction Strengthening;2 sets;10 reps      Modalities   Modalities Vasopneumatic      Vasopneumatic   Number Minutes Vasopneumatic  15 minutes    Vasopnuematic Location  Knee    Vasopneumatic Pressure Medium    Vasopneumatic Temperature  34 degrees      Manual Therapy   Manual Therapy Joint mobilization    Soft tissue mobilization mobilization of patella full motion noted without pain with passive movement of the patella                    PT Education - 01/24/21 1223     Education Details Patient education on home exercise to continue doing independently.    Person(s) Educated Patient    Methods Explanation    Comprehension Verbalized understanding                 PT Long Term Goals - 01/25/21 0901       PT LONG TERM GOAL #1   Title be independent in advanced HEP for return to wellness program    Baseline patient is idnependnet with alle exercises    Time 6    Period Weeks    Status Achieved      PT LONG TERM GOAL #2   Title Pt will report decrease in R knee pain by 50% with activity    Baseline still painful at work    Time 6    Period Weeks    Status Not Met      PT LONG TERM GOAL #3   Title Pt will be able to perform functional squat with 25# without pain to demonstrate improved functional strength    Baseline unable to without pain    Time 6    Period Weeks    Status Not Met                   Plan - 01/24/21 1226     Clinical Impression Statement Patient has reache max potential for PT. She continues to feel grinding under her knee cap. We have trialed tape without a significant improvement. She has been doing her exercises as consitently as she has been able. She has also had some life stressors that have made it hard to get to therapy. She Feels like the swelling  might be a little better. When she is up for an entire shift she feels like her knee is very unstable. 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 She feels like  it helps with short termrelief but has no long lasting effect. She will continue with her home exercises on her own.    Personal Factors and Comorbidities Age;Fitness;Comorbidity 1;Time since onset of injury/illness/exacerbation;Profession    Comorbidities OA, medial knee pain, R rotator cuff repair, L shoulder pain, L ankle surgery    Examination-Activity Limitations Squat;Sit;Stairs;Locomotion Level;Lift;Transfers    Examination-Participation Restrictions Community Activity;Occupation;Shop;Yard Work;Cleaning    Stability/Clinical Decision Making Stable/Uncomplicated    Clinical Decision Making Low    Rehab Potential Good    PT Frequency 2x / week    PT Duration 6 weeks    PT Treatment/Interventions ADLs/Self Care Home Management;Aquatic Therapy;Cryotherapy;Electrical Stimulation;Iontophoresis 55m/ml Dexamethasone;Moist Heat;Ultrasound;Gait training;Stair training;Functional mobility training;Therapeutic activities;Therapeutic exercise;Balance training;Neuromuscular re-education;Manual techniques;Patient/family education;Passive range of motion;Taping;Dry needling    PT Next Visit Plan Slowly progress land ther ex per pt tolerance.  cont with manual techniques and gameready.    PT Home Exercise Plan Access Code: [email or web address]    Consulted and Agree with Plan of Care Patient             Patient will benefit from skilled therapeutic intervention in order to improve the following deficits and impairments:  Abnormal gait, Pain, Increased fascial restricitons, Decreased mobility, Decreased range of motion, Decreased strength, Increased edema, Difficulty walking  Visit Diagnosis: Chronic pain of left knee  Muscle weakness (generalized)  Stiffness of left knee, not elsewhere classified     Problem List Patient Active Problem List   Diagnosis Date Noted   Allergic rhinitis due to pollen 08/21/2020   Benign hypertension 08/21/2020   Genital warts 08/21/2020   Hematuria 08/21/2020   Major  depression 08/21/2020   Mixed hyperlipidemia 08/21/2020   Obesity 08/21/2020   Somnolence 08/21/2020   Tobacco abuse 08/21/2020   Acute medial meniscus tear of left knee 08/21/2020    PHYSICAL THERAPY DISCHARGE SUMMARY  Visits from Start of Care: 14  Current functional level related to goals / functional outcomes: Continue pain and instability   Remaining deficits: Less swelling over the past few weeks   Education / Equipment: HEP   Patient agrees to discharge. Patient goals were partially met. Patient is being discharged due to lack of progress.   DCarney LivingPT DPT  01/25/2021, 9:11 AM  RMayviewSPT  01/25/2021   During this treatment session, the therapist was present, participating in and directing the treatment.    [address] [address]Phone: [phone]  Fax:  [phone] Name: Alyssa VAZQUEZMRN: [id number] of Birth: 8November 29, 1967

## 2021-01-28 ENCOUNTER — Ambulatory Visit (HOSPITAL_BASED_OUTPATIENT_CLINIC_OR_DEPARTMENT_OTHER): Payer: 59 | Admitting: Physical Therapy

## 2021-02-07 ENCOUNTER — Other Ambulatory Visit (HOSPITAL_COMMUNITY): Payer: Self-pay

## 2021-02-07 ENCOUNTER — Encounter (HOSPITAL_BASED_OUTPATIENT_CLINIC_OR_DEPARTMENT_OTHER): Payer: Self-pay | Admitting: Physical Therapy

## 2021-02-07 MED FILL — Celecoxib Cap 200 MG: ORAL | 30 days supply | Qty: 30 | Fill #2 | Status: AC

## 2021-02-19 ENCOUNTER — Other Ambulatory Visit (HOSPITAL_COMMUNITY): Payer: Self-pay

## 2021-02-19 DIAGNOSIS — M1712 Unilateral primary osteoarthritis, left knee: Secondary | ICD-10-CM | POA: Diagnosis not present

## 2021-02-19 MED ORDER — CELECOXIB 200 MG PO CAPS
ORAL_CAPSULE | ORAL | 3 refills | Status: DC
  Filled 2021-02-19: qty 30, 30d supply, fill #0

## 2021-02-21 DIAGNOSIS — G8929 Other chronic pain: Secondary | ICD-10-CM | POA: Diagnosis not present

## 2021-02-21 DIAGNOSIS — M7071 Other bursitis of hip, right hip: Secondary | ICD-10-CM | POA: Diagnosis not present

## 2021-02-21 DIAGNOSIS — M25562 Pain in left knee: Secondary | ICD-10-CM | POA: Diagnosis not present

## 2021-02-21 DIAGNOSIS — M1712 Unilateral primary osteoarthritis, left knee: Secondary | ICD-10-CM | POA: Diagnosis not present

## 2021-02-22 DIAGNOSIS — M7071 Other bursitis of hip, right hip: Secondary | ICD-10-CM | POA: Diagnosis not present

## 2021-03-05 ENCOUNTER — Other Ambulatory Visit (HOSPITAL_COMMUNITY): Payer: Self-pay

## 2021-03-05 MED ORDER — VENLAFAXINE HCL ER 75 MG PO CP24
ORAL_CAPSULE | ORAL | 0 refills | Status: DC
  Filled 2021-03-05: qty 180, 90d supply, fill #0

## 2021-04-11 DIAGNOSIS — G8929 Other chronic pain: Secondary | ICD-10-CM | POA: Diagnosis not present

## 2021-04-11 DIAGNOSIS — M1712 Unilateral primary osteoarthritis, left knee: Secondary | ICD-10-CM | POA: Diagnosis not present

## 2021-04-11 DIAGNOSIS — M25562 Pain in left knee: Secondary | ICD-10-CM | POA: Diagnosis not present

## 2021-04-15 NOTE — Progress Notes (Signed)
Sent message, via epic in basket, requesting orders in epic from surgeon.  

## 2021-04-18 ENCOUNTER — Other Ambulatory Visit: Payer: Self-pay | Admitting: Physician Assistant

## 2021-04-18 ENCOUNTER — Encounter: Payer: Self-pay | Admitting: Orthopedic Surgery

## 2021-04-18 DIAGNOSIS — F17200 Nicotine dependence, unspecified, uncomplicated: Secondary | ICD-10-CM

## 2021-04-18 DIAGNOSIS — M1712 Unilateral primary osteoarthritis, left knee: Secondary | ICD-10-CM

## 2021-04-18 HISTORY — DX: Unilateral primary osteoarthritis, left knee: M17.12

## 2021-04-23 ENCOUNTER — Other Ambulatory Visit: Payer: Self-pay | Admitting: Physician Assistant

## 2021-04-23 ENCOUNTER — Encounter: Payer: Self-pay | Admitting: Orthopedic Surgery

## 2021-04-23 DIAGNOSIS — M545 Low back pain, unspecified: Secondary | ICD-10-CM

## 2021-04-23 DIAGNOSIS — M1712 Unilateral primary osteoarthritis, left knee: Secondary | ICD-10-CM

## 2021-04-23 DIAGNOSIS — M25551 Pain in right hip: Secondary | ICD-10-CM | POA: Diagnosis not present

## 2021-04-23 DIAGNOSIS — M4156 Other secondary scoliosis, lumbar region: Secondary | ICD-10-CM

## 2021-04-23 DIAGNOSIS — M4150 Other secondary scoliosis, site unspecified: Secondary | ICD-10-CM

## 2021-04-23 DIAGNOSIS — M5410 Radiculopathy, site unspecified: Secondary | ICD-10-CM

## 2021-04-23 DIAGNOSIS — S83242D Other tear of medial meniscus, current injury, left knee, subsequent encounter: Secondary | ICD-10-CM | POA: Diagnosis not present

## 2021-04-23 DIAGNOSIS — M79605 Pain in left leg: Secondary | ICD-10-CM

## 2021-04-23 HISTORY — DX: Radiculopathy, site unspecified: M54.10

## 2021-04-23 HISTORY — DX: Other secondary scoliosis, lumbar region: M41.56

## 2021-04-23 NOTE — H&P (Addendum)
TOTAL KNEE ADMISSION H&P  Patient is being admitted for left total knee arthroplasty.  Subjective:  Chief Complaint:left knee pain.  HPI: [name], 55 y.o. female, has a history of pain and functional disability in the left knee due to arthritis and has failed non-surgical conservative treatments for greater than 12 weeks to includeNSAID's and/or analgesics, corticosteriod injections, viscosupplementation injections, flexibility and strengthening excercises, supervised PT with diminished ADL's post treatment, and activity modification.  Onset of symptoms was gradual, starting 4 years ago with gradually worsening course since that time. The patient noted prior procedures on the knee to include  arthroscopy and menisectomy on the left knee(s).  Patient currently rates pain in the left knee(s) at 10 out of 10 with activity. Patient has night pain, worsening of pain with activity and weight bearing, pain that interferes with activities of daily living, crepitus, and joint swelling.  Patient has evidence of subchondral sclerosis, periarticular osteophytes, and joint space narrowing by imaging studies. There is no active infection.  Patient Active Problem List   Diagnosis Date Noted   Radicular syndrome of left leg 04/23/2021   Scoliosis of lumbar region due to degenerative disease of spine in adult 04/23/2021   Primary localized osteoarthritis of left knee 04/18/2021   Allergic rhinitis due to pollen 08/21/2020   Benign hypertension 08/21/2020   Genital warts 08/21/2020   Hematuria 08/21/2020   Major depression 08/21/2020   Mixed hyperlipidemia 08/21/2020   Obesity 08/21/2020   Somnolence 08/21/2020   Tobacco abuse 08/21/2020   Acute medial meniscus tear of left knee 08/21/2020   Past Medical History:  Diagnosis Date   Acute medial meniscus tear of left knee 08/21/2020   Allergic rhinitis due to pollen 08/21/2020   Benign hypertension 08/21/2020   Genital warts 08/21/2020   Hematuria  08/21/2020   Major depression 08/21/2020   Mixed hyperlipidemia 08/21/2020   Obesity 08/21/2020   Primary localized osteoarthritis of left knee 04/18/2021   Primary localized osteoarthritis of left knee    Radicular syndrome of left leg 04/23/2021   Scoliosis of lumbar region due to degenerative disease of spine in adult 04/23/2021   Sleep apnea    Somnolence 08/21/2020   Tobacco abuse 08/21/2020    Past Surgical History:  Procedure Laterality Date   ANKLE FRACTURE SURGERY     CESAREAN SECTION     FRACTURE SURGERY     HAND SURGERY     KNEE ARTHROSCOPY WITH MEDIAL MENISECTOMY Left 09/04/2020   Procedure: KNEE ARTHROSCOPY WITH MEDIAL MENISECTOMY;  Surgeon: Salvatore Marvel, MD;  Location: [facility];  Service: Orthopedics;  Laterality: Left;   ROTATOR CUFF REPAIR     TUBAL LIGATION      No current facility-administered medications for this encounter.   Current Outpatient Medications  Medication Sig Dispense Refill Last Dose   celecoxib (CELEBREX) 200 MG capsule Take 1 capsule by mouth once a day with food for pain and swelling (Patient taking differently: Take 200 mg by mouth daily as needed for moderate pain.) 30 capsule 3 Past Week   cyclobenzaprine (FLEXERIL) 5 MG tablet TAKE 1 TABLET BY MOUTH AT BEDTIME AS NEEDED FOR MUSCLE SPASM 30 tablet 0 Past Week   diclofenac Sodium (VOLTAREN) 1 % GEL Apply 1 application topically 2 (two) times daily as needed (pain).   04/22/2021   fluticasone (FLONASE) 50 MCG/ACT nasal spray Place 1 spray into both nostrils daily.   04/23/2021   glucosamine-chondroitin 500-400 MG tablet Take 1 tablet by mouth daily.  04/22/2021   metoprolol succinate (TOPROL-XL) 50 MG 24 hr tablet TAKE 1 TABLET BY MOUTH ONCE DAILY 90 tablet 3 04/23/2021   Turmeric 1053 MG TABS Take 1,053 mg by mouth daily.   04/22/2021   venlafaxine XR (EFFEXOR-XR) 75 MG 24 hr capsule Take 2 capsules by mouth once daily (Patient taking differently: Take 75 mg by mouth daily with  breakfast.) 180 capsule 0 04/23/2021   predniSONE (DELTASONE) 10 MG tablet TAKE 6 TABLETS BY MOUTH DAILY FOR 4 DAYS,THEN 4 TABS DAILY FOR 4 DAYS,THEN 2 TABS DAILY FOR 4 DAYS,THEN STOP (Patient not taking: No sig reported) 48 tablet 0 Not Taking   predniSONE (DELTASONE) 10 MG tablet TAKE 6 TABLETS BY MOUTH ONCE A DAY FOR 4 DAYS, 4 TABLETS DAILY FOR 4 DAYS, 2 TABLETS DAILY FOR 4 DAYS (Patient not taking: No sig reported) 48 tablet 0 Not Taking   predniSONE (DELTASONE) 10 MG tablet TAKE 5 TABLETS BY MOUTH DAILY FOR 5 DAYS WITH FOOD (Patient not taking: No sig reported) 25 tablet 0 Not Taking   venlafaxine XR (EFFEXOR-XR) 75 MG 24 hr capsule TAKE 2 CAPSULES BY MOUTH ONCE A DAY (Patient not taking: No sig reported) 180 capsule 2 Not Taking   Allergies  Allergen Reactions   Codeine Itching   Lisinopril     Other reaction(s): cough   Penicillins Nausea Only   Singulair [Montelukast] Other (See Comments)    Headaches/migraines   Erythromycin Rash    Social History   Tobacco Use   Smoking status: Never   Smokeless tobacco: Never  Substance Use Topics   Alcohol use: No    No family history on file.   Review of Systems  Constitutional: Negative.   HENT: Negative.    Eyes: Negative.   Respiratory: Negative.    Cardiovascular: Negative.   Gastrointestinal: Negative.   Endocrine: Negative.   Genitourinary: Negative.   Musculoskeletal:  Positive for arthralgias, back pain, gait problem, joint swelling and myalgias.  Skin: Negative.   Allergic/Immunologic: Negative.   Psychiatric/Behavioral: Negative.     Objective:  Physical Exam Constitutional:      Appearance: Normal appearance.  HENT:     Head: Normocephalic and atraumatic.     Right Ear: External ear normal.     Left Ear: External ear normal.     Nose: Nose normal.     Mouth/Throat:     Mouth: Mucous membranes are moist.  Eyes:     Conjunctiva/sclera: Conjunctivae normal.  Cardiovascular:     Rate and Rhythm: Normal rate  and regular rhythm.  Pulmonary:     Effort: Pulmonary effort is normal.     Breath sounds: Normal breath sounds.  Abdominal:     General: Bowel sounds are normal.     Palpations: Abdomen is soft.  Genitourinary:    Comments: Not pertinent to current symptomatology therefore not examined.  Musculoskeletal:     Cervical back: Neck supple.     Comments: She is independently ambulatory with a moderately antalgic gait.  Left leg weakness and radiculapathy for the past 6 weeks    Has tried geniculate block and ablation by Dr Cherrie Distance at [facility] without success .  Today difficulty sitting due to left leg radicular pain.  Positive straight leg raises on the left. Negative contralateral straight leg raises.  Left knee -5 to 110 range of motion.  2+ crepitus  2+ synovitis.  2+ posterior tibial tendon pulse.  Right knee has full range of motion without pain  swelling or deformity.    Skin:    General: Skin is warm and dry.     Capillary Refill: Capillary refill takes less than 2 seconds.  Neurological:     General: No focal deficit present.     Mental Status: She is alert.  Psychiatric:        Mood and Affect: Mood normal.        Behavior: Behavior normal.    Vital signs in last 24 hours: Temp:  [97.8 F (36.6 C)] 97.8 F (36.6 C) (10/18 1300) Pulse Rate:  [78] 78 (10/18 1300) BP: (118)/(84) 118/84 (10/18 1300) SpO2:  [96 %] 96 % (10/18 1300) Weight:  [94.9 kg] 94.9 kg (10/18 1300)  Labs:   Estimated body mass index is 35.91 kg/m as calculated from the following:   Height as of this encounter: 5\' 4"  (1.626 m).   Weight as of this encounter: 94.9 kg.   Imaging Review Plain radiographs demonstrate severe degenerative joint disease of the left knee(s). The overall alignment issignificant varus. The bone quality appears to be good for age and reported activity level.  AP pelvis shows both hip are normal, AP and lateral of the lumbar spine shows degenerative  scoliosis.     Assessment/Plan:  End stage arthritis, left knee  Principal Problem:   Primary localized osteoarthritis of left knee Active Problems:   Benign hypertension   Major depression   Mixed hyperlipidemia   Obesity   Radicular syndrome of left leg   Scoliosis of lumbar region due to degenerative disease of spine in adult   The patient history, physical examination, clinical judgment of the provider and imaging studies are consistent with end stage degenerative joint disease of the left knee(s) and total knee arthroplasty is deemed medically necessary. The treatment options including medical management, injection therapy arthroscopy and arthroplasty were discussed at length. The risks and benefits of total knee arthroplasty were presented and reviewed. The risks due to aseptic loosening, infection, stiffness, patella tracking problems, thromboembolic complications and other imponderables were discussed. The patient acknowledged the explanation, agreed to proceed with the plan and consent was signed. Patient is being admitted for inpatient treatment for surgery, pain control, PT, OT, prophylactic antibiotics, VTE prophylaxis, progressive ambulation and ADL's and discharge planning. The patient is planning to be discharged to home.  She starts outpatient physical therapy on 05-08-21 at [facility] on [address].  [facility]/16/[facility]  Prior to her total knee replacement surgery we have order a lumbar spine MRI on Friday 04/26/21 and set her up for possible left sided nerve root injections vs lumbar epidural steroid injections by Dr 04/28/21 at [facility] on Tuesday 04/30/2021 at 1:15 pm for her left leg weakness and radiculopathy.

## 2021-04-23 NOTE — Patient Instructions (Signed)
DUE TO COVID-19 ONLY ONE VISITOR IS ALLOWED TO COME WITH YOU AND STAY IN THE WAITING ROOM ONLY DURING PRE OP AND PROCEDURE.   **NO VISITORS ARE ALLOWED IN THE SHORT STAY AREA OR RECOVERY ROOM!!**  IF YOU WILL BE ADMITTED INTO THE HOSPITAL YOU ARE ALLOWED ONLY TWO SUPPORT PEOPLE DURING VISITATION HOURS ONLY (10AM -8PM)   The support person(s) may change daily. The support person(s) must pass our screening, gel in and out, and wear a mask at all times, including in the patient's room. Patients must also wear a mask when staff or their support person are in the room.  No visitors under the age of 59. Any visitor under the age of 46 must be accompanied by an adult.     Your procedure is scheduled on: 05/06/21   Report to [facility] Main Entrance   Report to Short Stay at 5:15 AM   [facility])   Call this number if you have problems the morning of surgery [phone]   Do not eat food :After Midnight.   May have liquids until : 4:15 AM   day of surgery  CLEAR LIQUID DIET  Foods Allowed                                                                     Foods Excluded  Water, Black Coffee and tea, regular and decaf                             liquids that you cannot  Plain Jell-O in any flavor  (No red)                                           see through such as: Fruit ices (not with fruit pulp)                                     milk, soups, orange juice              Iced Popsicles (No red)                                    All solid food                                   Apple juices Sports drinks like Gatorade (No red) Lightly seasoned clear broth or consume(fat free) Sugar,   Sample Menu Breakfast                                Lunch                                     Supper Cranberry juice  Beef broth                            Chicken broth Jell-O                                     Grape juice                           Apple juice Coffee  or tea                        Jell-O                                      Popsicle                                                Coffee or tea                        Coffee or tea      Complete one Ensure drink the morning of surgery at: 4:15 AM       the day of surgery. The day of surgery:  Drink ONE (1) Pre-Surgery Clear Ensure or G2 by am the morning of surgery. Drink in one sitting. Do not sip.  This drink was given to you during your hospital  pre-op appointment visit. Nothing else to drink after completing the  Pre-Surgery Clear Ensure or G2.          If you have questions, please contact your surgeon's office.     Oral Hygiene is also important to reduce your risk of infection.                                    Remember - BRUSH YOUR TEETH THE MORNING OF SURGERY WITH YOUR REGULAR TOOTHPASTE   Do NOT smoke after Midnight   Take these medicines the morning of surgery with A SIP OF WATER: metoprolol. Use flonase as usual.  DO NOT TAKE ANY ORAL DIABETIC MEDICATIONS DAY OF YOUR SURGERY                              You may not have any metal on your body including hair pins, jewelry, and body piercing             Do not wear make-up, lotions, powders, perfumes/cologne, or deodorant  Do not wear nail polish including gel and S&S, artificial/acrylic nails, or any other type of covering on natural nails including finger and toenails. If you have artificial nails, gel coating, etc. that needs to be removed by a nail salon please have this removed prior to surgery or surgery may need to be canceled/ delayed if the surgeon/ anesthesia feels like they are unable to be safely monitored.   Do not shave  48 hours prior to surgery.   Do not bring valuables to the hospital. [facility] IS NOT  RESPONSIBLE   FOR VALUABLES.   Contacts, dentures or bridgework may not be worn into surgery.   Bring small overnight bag day of surgery.    Patients discharged on the day of surgery  will not be allowed to drive home.   Special Instructions: Bring a copy of your healthcare power of attorney and living will documents         the day of surgery if you haven't scanned them before.              Please read over the following fact sheets you were given: IF YOU HAVE QUESTIONS ABOUT YOUR PRE-OP INSTRUCTIONS PLEASE CALL [phone]   [facility] - Preparing for Surgery Before surgery, you can play an important role.  Because skin is not sterile, your skin needs to be as free of germs as possible.  You can reduce the number of germs on your skin by washing with CHG (chlorahexidine gluconate) soap before surgery.  CHG is an antiseptic cleaner which kills germs and bonds with the skin to continue killing germs even after washing. Please DO NOT use if you have an allergy to CHG or antibacterial soaps.  If your skin becomes reddened/irritated stop using the CHG and inform your nurse when you arrive at Short Stay. Do not shave (including legs and underarms) for at least 48 hours prior to the first CHG shower.  You may shave your face/neck. Please follow these instructions carefully:  1.  Shower with CHG Soap the night before surgery and the  morning of Surgery.  2.  If you choose to wash your hair, wash your hair first as usual with your  normal  shampoo.  3.  After you shampoo, rinse your hair and body thoroughly to remove the  shampoo.                           4.  Use CHG as you would any other liquid soap.  You can apply chg directly  to the skin and wash                       Gently with a scrungie or clean washcloth.  5.  Apply the CHG Soap to your body ONLY FROM THE NECK DOWN.   Do not use on face/ open                           Wound or open sores. Avoid contact with eyes, ears mouth and genitals (private parts).                       Wash face,  Genitals (private parts) with your normal soap.             6.  Wash thoroughly, paying special attention to the area where your surgery   will be performed.  7.  Thoroughly rinse your body with warm water from the neck down.  8.  DO NOT shower/wash with your normal soap after using and rinsing off  the CHG Soap.                9.  Pat yourself dry with a clean towel.            10.  Wear clean pajamas.            11.  Place clean sheets on your  bed the night of your first shower and do not  sleep with pets. Day of Surgery : Do not apply any lotions/deodorants the morning of surgery.  Please wear clean clothes to the hospital/surgery center.  FAILURE TO FOLLOW THESE INSTRUCTIONS MAY RESULT IN THE CANCELLATION OF YOUR SURGERY PATIENT SIGNATURE_________________________________  NURSE SIGNATURE__________________________________  ________________________________________________________________________   Rogelia Mire  An incentive spirometer is a tool that can help keep your lungs clear and active. This tool measures how well you are filling your lungs with each breath. Taking long deep breaths may help reverse or decrease the chance of developing breathing (pulmonary) problems (especially infection) following: A long period of time when you are unable to move or be active. BEFORE THE PROCEDURE  If the spirometer includes an indicator to show your best effort, your nurse or respiratory therapist will set it to a desired goal. If possible, sit up straight or lean slightly forward. Try not to slouch. Hold the incentive spirometer in an upright position. INSTRUCTIONS FOR USE  Sit on the edge of your bed if possible, or sit up as far as you can in bed or on a chair. Hold the incentive spirometer in an upright position. Breathe out normally. Place the mouthpiece in your mouth and seal your lips tightly around it. Breathe in slowly and as deeply as possible, raising the piston or the ball toward the top of the column. Hold your breath for 3-5 seconds or for as long as possible. Allow the piston or ball to fall to the bottom  of the column. Remove the mouthpiece from your mouth and breathe out normally. Rest for a few seconds and repeat Steps 1 through 7 at least 10 times every 1-2 hours when you are awake. Take your time and take a few normal breaths between deep breaths. The spirometer may include an indicator to show your best effort. Use the indicator as a goal to work toward during each repetition. After each set of 10 deep breaths, practice coughing to be sure your lungs are clear. If you have an incision (the cut made at the time of surgery), support your incision when coughing by placing a pillow or rolled up towels firmly against it. Once you are able to get out of bed, walk around indoors and cough well. You may stop using the incentive spirometer when instructed by your caregiver.  RISKS AND COMPLICATIONS Take your time so you do not get dizzy or light-headed. If you are in pain, you may need to take or ask for pain medication before doing incentive spirometry. It is harder to take a deep breath if you are having pain. AFTER USE Rest and breathe slowly and easily. It can be helpful to keep track of a log of your progress. Your caregiver can provide you with a simple table to help with this. If you are using the spirometer at home, follow these instructions: SEEK MEDICAL CARE IF:  You are having difficultly using the spirometer. You have trouble using the spirometer as often as instructed. Your pain medication is not giving enough relief while using the spirometer. You develop fever of 100.5 F (38.1 C) or higher. SEEK IMMEDIATE MEDICAL CARE IF:  You cough up bloody sputum that had not been present before. You develop fever of 102 F (38.9 C) or greater. You develop worsening pain at or near the incision site. MAKE SURE YOU:  Understand these instructions. Will watch your condition. Will get help right away if you are not  doing well or get worse. Document Released: 11/03/2006 Document Revised:  09/15/2011 Document Reviewed: 01/04/2007 [facility] Patient Information 2014 [place], [place].   ________________________________________________________________________

## 2021-04-24 ENCOUNTER — Encounter (HOSPITAL_COMMUNITY)
Admission: RE | Admit: 2021-04-24 | Discharge: 2021-04-24 | Disposition: A | Discharge status: Home / Self Care | Payer: 59 | Source: Ambulatory Visit | Attending: Orthopedic Surgery | Admitting: Orthopedic Surgery

## 2021-04-24 ENCOUNTER — Other Ambulatory Visit: Payer: Self-pay

## 2021-04-24 ENCOUNTER — Encounter (HOSPITAL_COMMUNITY): Payer: Self-pay

## 2021-04-24 ENCOUNTER — Other Ambulatory Visit (HOSPITAL_COMMUNITY): Payer: Self-pay

## 2021-04-24 VITALS — BP 134/82 | HR 69 | Temp 98.0°F | Resp 18 | Ht 63.0 in | Wt 204.4 lb

## 2021-04-24 DIAGNOSIS — R7303 Prediabetes: Secondary | ICD-10-CM

## 2021-04-24 DIAGNOSIS — Z01812 Encounter for preprocedural laboratory examination: Secondary | ICD-10-CM | POA: Diagnosis not present

## 2021-04-24 DIAGNOSIS — R300 Dysuria: Secondary | ICD-10-CM | POA: Diagnosis not present

## 2021-04-24 DIAGNOSIS — G8929 Other chronic pain: Secondary | ICD-10-CM

## 2021-04-24 DIAGNOSIS — M25562 Pain in left knee: Secondary | ICD-10-CM | POA: Diagnosis not present

## 2021-04-24 DIAGNOSIS — Z01818 Encounter for other preprocedural examination: Secondary | ICD-10-CM

## 2021-04-24 HISTORY — DX: Prediabetes: R73.03

## 2021-04-24 HISTORY — DX: Personal history of urinary calculi: Z87.442

## 2021-04-24 LAB — COMPREHENSIVE METABOLIC PANEL
ALT: 32 U/L (ref 0–44)
AST: 21 U/L (ref 15–41)
Albumin: 4.2 g/dL (ref 3.5–5.0)
Alkaline Phosphatase: 84 U/L (ref 38–126)
Anion gap: 7 (ref 5–15)
BUN: 18 mg/dL (ref 6–20)
CO2: 28 mmol/L (ref 22–32)
Calcium: 9.5 mg/dL (ref 8.9–10.3)
Chloride: 103 mmol/L (ref 98–111)
Creatinine, Ser: 0.62 mg/dL (ref 0.44–1.00)
GFR, Estimated: >60 mL/min (ref >60)
Glucose, Bld: 76 mg/dL (ref 70–99)
Potassium: 3.5 mmol/L (ref 3.5–5.1)
Sodium: 138 mmol/L (ref 135–145)
Total Bilirubin: 0.8 mg/dL (ref 0.3–1.2)
Total Protein: 7.5 g/dL (ref 6.5–8.1)

## 2021-04-24 LAB — GLUCOSE, CAPILLARY: Glucose-Capillary: 93 mg/dL (ref 70–99)

## 2021-04-24 LAB — CBC WITH DIFFERENTIAL/PLATELET
Abs Immature Granulocytes: 0.07 10*3/uL (ref 0.00–0.07)
Basophils Absolute: 0.1 10*3/uL (ref 0.0–0.1)
Basophils Relative: 1 %
Eosinophils Absolute: 0.1 10*3/uL (ref 0.0–0.5)
Eosinophils Relative: 1 %
HCT: 43.5 % (ref 36.0–46.0)
Hemoglobin: 14.4 g/dL (ref 12.0–15.0)
Immature Granulocytes: 1 %
Lymphocytes Relative: 20 %
Lymphs Abs: 2.3 10*3/uL (ref 0.7–4.0)
MCH: 28.6 pg (ref 26.0–34.0)
MCHC: 33.1 g/dL (ref 30.0–36.0)
MCV: 86.3 fL (ref 80.0–100.0)
Monocytes Absolute: 0.7 10*3/uL (ref 0.1–1.0)
Monocytes Relative: 6 %
Neutro Abs: 8.5 10*3/uL — ABNORMAL HIGH (ref 1.7–7.7)
Neutrophils Relative %: 71 %
Platelets: 327 10*3/uL (ref 150–400)
RBC: 5.04 MIL/uL (ref 3.87–5.11)
RDW: 14.5 % (ref 11.5–15.5)
WBC: 11.7 10*3/uL — ABNORMAL HIGH (ref 4.0–10.5)
nRBC: 0 % (ref 0.0–0.2)

## 2021-04-24 LAB — SURGICAL PCR SCREEN
MRSA, PCR: NEGATIVE
Staphylococcus aureus: POSITIVE — AB

## 2021-04-24 LAB — HEMOGLOBIN A1C
Hgb A1c MFr Bld: 5.2 % (ref 4.8–5.6)
Mean Plasma Glucose: 102.54 mg/dL

## 2021-04-24 MED ORDER — MUPIROCIN 2 % EX OINT
TOPICAL_OINTMENT | CUTANEOUS | 5 refills | Status: AC
  Filled 2021-04-24: qty 44, 14d supply, fill #0

## 2021-04-24 MED ORDER — CEPHALEXIN 500 MG PO CAPS
500.0000 mg | ORAL_CAPSULE | Freq: Four times a day (QID) | ORAL | 0 refills | Status: DC
  Filled 2021-04-24: qty 40, 10d supply, fill #0

## 2021-04-24 NOTE — Progress Notes (Signed)
COVID Vaccine Completed: Yes Date COVID Vaccine completed: 2021.  COVID vaccine manufacturer: Pfizer x 3 COVID Test: N/A  PCP - Dr. Lupita Raider Cardiologist -   Chest x-ray -  EKG - 08/09/20 Stress Test -  ECHO -  Cardiac Cath -  Pacemaker/ICD device last checked:  Sleep Study - Yes CPAP - Yes  Fasting Blood Sugar - N/A Checks Blood Sugar _____ times a day  Blood Thinner Instructions: Aspirin Instructions: Last Dose:  Anesthesia review: Hx: HTN,OSA(CPAP)  Patient denies shortness of breath, fever, cough and chest pain at PAT appointment   Patient verbalized understanding of instructions that were given to them at the PAT appointment. Patient was also instructed that they will need to review over the PAT instructions again at home before surgery.

## 2021-04-24 NOTE — Progress Notes (Signed)
PCR: + STAPH °

## 2021-04-25 LAB — URINE CULTURE: Culture: NO GROWTH

## 2021-04-26 DIAGNOSIS — M545 Low back pain, unspecified: Secondary | ICD-10-CM | POA: Diagnosis not present

## 2021-04-30 DIAGNOSIS — M5417 Radiculopathy, lumbosacral region: Secondary | ICD-10-CM | POA: Diagnosis not present

## 2021-05-05 NOTE — Anesthesia Preprocedure Evaluation (Addendum)
Anesthesia Evaluation  Patient identified by MRN, date of birth, ID band Patient awake    Reviewed: Allergy & Precautions, NPO status , Patient's Chart, lab work & pertinent test results  History of Anesthesia Complications Negative for: history of anesthetic complications  Airway Mallampati: III  TM Distance: >3 FB Neck ROM: Full    Dental no notable dental hx. (+) Dental Advisory Given   Pulmonary sleep apnea and Continuous Positive Airway Pressure Ventilation , former smoker,    Pulmonary exam normal        Cardiovascular hypertension, Pt. on medications and Pt. on home beta blockers Normal cardiovascular exam     Neuro/Psych PSYCHIATRIC DISORDERS Depression negative neurological ROS     GI/Hepatic negative GI ROS, Neg liver ROS,   Endo/Other  negative endocrine ROS  Renal/GU negative Renal ROS     Musculoskeletal negative musculoskeletal ROS (+)   Abdominal (+) - obese,   Peds  Hematology negative hematology ROS (+)   Anesthesia Other Findings All: see list  Reproductive/Obstetrics                           Anesthesia Physical  Anesthesia Plan  ASA: 2  Anesthesia Plan: Spinal and MAC   Post-op Pain Management:  [facility] for Post-op pain   Induction: Intravenous  PONV Risk Score and Plan: 3 and Treatment may vary due to age or medical condition, Ondansetron, Dexamethasone and Midazolam  Airway Management Planned: Natural Airway  Additional Equipment: None  Intra-op Plan:   Post-operative Plan:   Informed Consent: I have reviewed the patients History and Physical, chart, labs and discussed the procedure including the risks, benefits and alternatives for the proposed anesthesia with the patient or authorized representative who has indicated his/her understanding and acceptance.     Dental advisory given  Plan Discussed with: Anesthesiologist and CRNA  Anesthesia Plan  Comments:        Anesthesia Quick Evaluation

## 2021-05-06 ENCOUNTER — Observation Stay (HOSPITAL_COMMUNITY)
Admission: RE | Admit: 2021-05-06 | Discharge: 2021-05-07 | Disposition: A | Discharge status: Home / Self Care | Payer: 59 | Source: Ambulatory Visit | Attending: Orthopedic Surgery | Admitting: Orthopedic Surgery

## 2021-05-06 ENCOUNTER — Other Ambulatory Visit: Payer: Self-pay

## 2021-05-06 ENCOUNTER — Ambulatory Visit (HOSPITAL_COMMUNITY): Payer: 59 | Admitting: Anesthesiology

## 2021-05-06 ENCOUNTER — Encounter (HOSPITAL_COMMUNITY)
Admission: RE | Disposition: A | Discharge status: Home / Self Care | Payer: Self-pay | Source: Ambulatory Visit | Attending: Orthopedic Surgery

## 2021-05-06 ENCOUNTER — Encounter (HOSPITAL_COMMUNITY): Payer: Self-pay | Admitting: Orthopedic Surgery

## 2021-05-06 ENCOUNTER — Other Ambulatory Visit (HOSPITAL_COMMUNITY): Payer: Self-pay

## 2021-05-06 DIAGNOSIS — F329 Major depressive disorder, single episode, unspecified: Secondary | ICD-10-CM | POA: Diagnosis present

## 2021-05-06 DIAGNOSIS — E669 Obesity, unspecified: Secondary | ICD-10-CM | POA: Diagnosis present

## 2021-05-06 DIAGNOSIS — E782 Mixed hyperlipidemia: Secondary | ICD-10-CM | POA: Diagnosis not present

## 2021-05-06 DIAGNOSIS — M5410 Radiculopathy, site unspecified: Secondary | ICD-10-CM | POA: Diagnosis present

## 2021-05-06 DIAGNOSIS — R300 Dysuria: Secondary | ICD-10-CM

## 2021-05-06 DIAGNOSIS — G4730 Sleep apnea, unspecified: Secondary | ICD-10-CM | POA: Diagnosis not present

## 2021-05-06 DIAGNOSIS — M1712 Unilateral primary osteoarthritis, left knee: Secondary | ICD-10-CM | POA: Diagnosis not present

## 2021-05-06 DIAGNOSIS — G8918 Other acute postprocedural pain: Secondary | ICD-10-CM | POA: Diagnosis not present

## 2021-05-06 DIAGNOSIS — I10 Essential (primary) hypertension: Secondary | ICD-10-CM | POA: Insufficient documentation

## 2021-05-06 DIAGNOSIS — Z9989 Dependence on other enabling machines and devices: Secondary | ICD-10-CM | POA: Diagnosis not present

## 2021-05-06 DIAGNOSIS — Z720 Tobacco use: Secondary | ICD-10-CM | POA: Diagnosis present

## 2021-05-06 DIAGNOSIS — Z79899 Other long term (current) drug therapy: Secondary | ICD-10-CM | POA: Diagnosis not present

## 2021-05-06 DIAGNOSIS — M4156 Other secondary scoliosis, lumbar region: Secondary | ICD-10-CM | POA: Diagnosis present

## 2021-05-06 DIAGNOSIS — Z96652 Presence of left artificial knee joint: Secondary | ICD-10-CM

## 2021-05-06 DIAGNOSIS — G8929 Other chronic pain: Secondary | ICD-10-CM

## 2021-05-06 DIAGNOSIS — Z01818 Encounter for other preprocedural examination: Secondary | ICD-10-CM

## 2021-05-06 HISTORY — PX: TOTAL KNEE ARTHROPLASTY: SHX125

## 2021-05-06 LAB — HCG, SERUM, QUALITATIVE: Preg, Serum: NEGATIVE

## 2021-05-06 LAB — GLUCOSE, CAPILLARY: Glucose-Capillary: 164 mg/dL — ABNORMAL HIGH (ref 70–99)

## 2021-05-06 SURGERY — ARTHROPLASTY, KNEE, TOTAL
Anesthesia: Monitor Anesthesia Care | Site: Knee | Laterality: Left

## 2021-05-06 MED ORDER — ACETAMINOPHEN 325 MG PO TABS: 325.0000 mg | ORAL_TABLET | Freq: Four times a day (QID) | ORAL | Status: DC | PRN

## 2021-05-06 MED ORDER — POLYETHYLENE GLYCOL 3350 17 G PO PACK
17.0000 g | PACK | Freq: Two times a day (BID) | ORAL | Status: DC
Start: 2021-05-06 — End: 2021-05-07
  Administered 2021-05-07: 17 g via ORAL
  Filled 2021-05-06 (×2): qty 1

## 2021-05-06 MED ORDER — BUPIVACAINE IN DEXTROSE 0.75-8.25 % IT SOLN
INTRATHECAL | Status: DC | PRN
  Administered 2021-05-06: 1.8 mL via INTRATHECAL

## 2021-05-06 MED ORDER — LACTATED RINGERS IV BOLUS
250.0000 mL | Freq: Once | INTRAVENOUS | Status: AC
Start: 2021-05-06 — End: 2021-05-06
  Administered 2021-05-06: 250 mL via INTRAVENOUS

## 2021-05-06 MED ORDER — LACTATED RINGERS IV BOLUS: 500.0000 mL | Freq: Once | INTRAVENOUS | Status: DC

## 2021-05-06 MED ORDER — TRANEXAMIC ACID-NACL 1000-0.7 MG/100ML-% IV SOLN
1000.0000 mg | Freq: Once | INTRAVENOUS | Status: AC
  Administered 2021-05-06: 1000 mg via INTRAVENOUS
  Filled 2021-05-06: qty 100

## 2021-05-06 MED ORDER — FENTANYL CITRATE PF 50 MCG/ML IJ SOSY: 25.0000 ug | PREFILLED_SYRINGE | INTRAMUSCULAR | Status: DC | PRN

## 2021-05-06 MED ORDER — 0.9 % SODIUM CHLORIDE (POUR BTL) OPTIME
TOPICAL | Status: DC | PRN
  Administered 2021-05-06: 1000 mL

## 2021-05-06 MED ORDER — METOCLOPRAMIDE HCL 5 MG/ML IJ SOLN: 5.0000 mg | Freq: Three times a day (TID) | INTRAMUSCULAR | Status: DC | PRN

## 2021-05-06 MED ORDER — OXYCODONE HCL 5 MG PO TABS
10.0000 mg | ORAL_TABLET | ORAL | Status: DC | PRN
  Administered 2021-05-06 – 2021-05-07 (×4): 15 mg via ORAL
  Filled 2021-05-06 (×4): qty 3

## 2021-05-06 MED ORDER — BUPIVACAINE LIPOSOME 1.3 % IJ SUSP
INTRAMUSCULAR | Status: DC | PRN
  Administered 2021-05-06: 20 mL

## 2021-05-06 MED ORDER — TRANEXAMIC ACID-NACL 1000-0.7 MG/100ML-% IV SOLN
1000.0000 mg | INTRAVENOUS | Status: AC
  Administered 2021-05-06: 1000 mg via INTRAVENOUS
  Filled 2021-05-06: qty 100

## 2021-05-06 MED ORDER — ROPIVACAINE HCL 5 MG/ML IJ SOLN
INTRAMUSCULAR | Status: DC | PRN
  Administered 2021-05-06: 20 mL via PERINEURAL

## 2021-05-06 MED ORDER — PHENOL 1.4 % MT LIQD: 1.0000 | OROMUCOSAL | Status: DC | PRN

## 2021-05-06 MED ORDER — ONDANSETRON HCL 4 MG/2ML IJ SOLN
INTRAMUSCULAR | Status: DC | PRN
  Administered 2021-05-06: 4 mg via INTRAVENOUS

## 2021-05-06 MED ORDER — LACTATED RINGERS IV SOLN: INTRAVENOUS | Status: DC

## 2021-05-06 MED ORDER — WATER FOR IRRIGATION, STERILE IR SOLN
Status: DC | PRN
  Administered 2021-05-06: 2000 mL

## 2021-05-06 MED ORDER — EPHEDRINE SULFATE-NACL 50-0.9 MG/10ML-% IV SOSY
PREFILLED_SYRINGE | INTRAVENOUS | Status: DC | PRN
  Administered 2021-05-06: 10 mg via INTRAVENOUS

## 2021-05-06 MED ORDER — ONDANSETRON HCL 4 MG PO TABS
4.0000 mg | ORAL_TABLET | Freq: Four times a day (QID) | ORAL | Status: DC | PRN
Start: 2021-05-06 — End: 2021-05-07

## 2021-05-06 MED ORDER — ACETAMINOPHEN 500 MG PO TABS
1000.0000 mg | ORAL_TABLET | Freq: Once | ORAL | Status: AC
  Administered 2021-05-06: 1000 mg via ORAL
  Filled 2021-05-06: qty 2

## 2021-05-06 MED ORDER — ACETAMINOPHEN 500 MG PO TABS
1000.0000 mg | ORAL_TABLET | Freq: Four times a day (QID) | ORAL | Status: AC
  Administered 2021-05-06 – 2021-05-07 (×4): 1000 mg via ORAL
  Filled 2021-05-06 (×4): qty 2

## 2021-05-06 MED ORDER — HYDROMORPHONE HCL 1 MG/ML IJ SOLN
0.5000 mg | INTRAMUSCULAR | Status: DC | PRN
  Administered 2021-05-06: 1 mg via INTRAVENOUS
  Filled 2021-05-06: qty 1

## 2021-05-06 MED ORDER — ASPIRIN EC 325 MG PO TBEC
325.0000 mg | DELAYED_RELEASE_TABLET | Freq: Every day | ORAL | Status: DC
  Administered 2021-05-07: 325 mg via ORAL
  Filled 2021-05-06: qty 1

## 2021-05-06 MED ORDER — ONDANSETRON HCL 4 MG/2ML IJ SOLN: 4.0000 mg | Freq: Four times a day (QID) | INTRAMUSCULAR | Status: DC | PRN

## 2021-05-06 MED ORDER — PROPOFOL 10 MG/ML IV BOLUS
INTRAVENOUS | Status: AC
  Filled 2021-05-06: qty 20

## 2021-05-06 MED ORDER — POVIDONE-IODINE 10 % EX SWAB: 2.0000 "application " | Freq: Once | CUTANEOUS | Status: DC

## 2021-05-06 MED ORDER — SODIUM CHLORIDE 0.9 % IR SOLN
Status: DC | PRN
  Administered 2021-05-06: 1000 mL

## 2021-05-06 MED ORDER — ROPIVACAINE HCL 5 MG/ML IJ SOLN: INTRAMUSCULAR | Status: DC | PRN

## 2021-05-06 MED ORDER — PROPOFOL 1000 MG/100ML IV EMUL
INTRAVENOUS | Status: AC
  Filled 2021-05-06: qty 100

## 2021-05-06 MED ORDER — POVIDONE-IODINE 10 % EX SWAB
2.0000 | Freq: Once | CUTANEOUS | Status: AC
Start: 2021-05-06 — End: 2021-05-06
  Administered 2021-05-06: 2 via TOPICAL

## 2021-05-06 MED ORDER — CELECOXIB 200 MG PO CAPS: 200.0000 mg | ORAL_CAPSULE | Freq: Once | ORAL | Status: DC

## 2021-05-06 MED ORDER — LIDOCAINE 2% (20 MG/ML) 5 ML SYRINGE
INTRAMUSCULAR | Status: DC | PRN
  Administered 2021-05-06: 40 mg via INTRAVENOUS

## 2021-05-06 MED ORDER — CEFAZOLIN SODIUM-DEXTROSE 2-4 GM/100ML-% IV SOLN
2.0000 g | INTRAVENOUS | Status: AC
  Administered 2021-05-06: 2 g via INTRAVENOUS
  Filled 2021-05-06: qty 100

## 2021-05-06 MED ORDER — MIDAZOLAM HCL 2 MG/2ML IJ SOLN
INTRAMUSCULAR | Status: AC
  Filled 2021-05-06: qty 2

## 2021-05-06 MED ORDER — METOPROLOL SUCCINATE ER 25 MG PO TB24
25.0000 mg | ORAL_TABLET | Freq: Every day | ORAL | Status: DC
  Administered 2021-05-07: 25 mg via ORAL
  Filled 2021-05-06: qty 1

## 2021-05-06 MED ORDER — MIDAZOLAM HCL 5 MG/5ML IJ SOLN
INTRAMUSCULAR | Status: DC | PRN
  Administered 2021-05-06: 2 mg via INTRAVENOUS

## 2021-05-06 MED ORDER — CELECOXIB 200 MG PO CAPS
200.0000 mg | ORAL_CAPSULE | Freq: Once | ORAL | Status: AC
  Administered 2021-05-06: 200 mg via ORAL
  Filled 2021-05-06: qty 1

## 2021-05-06 MED ORDER — MENTHOL 3 MG MT LOZG
1.0000 | LOZENGE | OROMUCOSAL | Status: DC | PRN
Start: 2021-05-06 — End: 2021-05-07
  Filled 2021-05-06: qty 9

## 2021-05-06 MED ORDER — DEXAMETHASONE SODIUM PHOSPHATE 10 MG/ML IJ SOLN
INTRAMUSCULAR | Status: AC
  Filled 2021-05-06: qty 1

## 2021-05-06 MED ORDER — PHENYLEPHRINE 40 MCG/ML (10ML) SYRINGE FOR IV PUSH (FOR BLOOD PRESSURE SUPPORT)
PREFILLED_SYRINGE | INTRAVENOUS | Status: DC | PRN
  Administered 2021-05-06: 80 ug via INTRAVENOUS

## 2021-05-06 MED ORDER — ALUM & MAG HYDROXIDE-SIMETH 200-200-20 MG/5ML PO SUSP: 30.0000 mL | ORAL | Status: DC | PRN

## 2021-05-06 MED ORDER — ONDANSETRON HCL 4 MG/2ML IJ SOLN
INTRAMUSCULAR | Status: AC
  Filled 2021-05-06: qty 2

## 2021-05-06 MED ORDER — DEXAMETHASONE SODIUM PHOSPHATE 10 MG/ML IJ SOLN
10.0000 mg | Freq: Three times a day (TID) | INTRAMUSCULAR | Status: AC
  Administered 2021-05-06: 10 mg via INTRAVENOUS
  Filled 2021-05-06: qty 1

## 2021-05-06 MED ORDER — SODIUM CHLORIDE 0.9 % IV SOLN
INTRAVENOUS | Status: AC | PRN
  Administered 2021-05-06: 1.5 g via INTRAVENOUS

## 2021-05-06 MED ORDER — POVIDONE-IODINE 7.5 % EX SOLN: Freq: Once | CUTANEOUS | Status: DC

## 2021-05-06 MED ORDER — CHLORHEXIDINE GLUCONATE 0.12 % MT SOLN
15.0000 mL | Freq: Once | OROMUCOSAL | Status: AC
Start: 2021-05-06 — End: 2021-05-06
  Administered 2021-05-06: 15 mL via OROMUCOSAL

## 2021-05-06 MED ORDER — PHENYLEPHRINE HCL-NACL 20-0.9 MG/250ML-% IV SOLN
INTRAVENOUS | Status: AC
  Filled 2021-05-06: qty 250

## 2021-05-06 MED ORDER — AMISULPRIDE (ANTIEMETIC) 5 MG/2ML IV SOLN: 10.0000 mg | Freq: Once | INTRAVENOUS | Status: DC | PRN

## 2021-05-06 MED ORDER — OXYCODONE HCL 5 MG PO TABS
5.0000 mg | ORAL_TABLET | ORAL | Status: DC | PRN
  Administered 2021-05-06: 10 mg via ORAL
  Filled 2021-05-06: qty 2

## 2021-05-06 MED ORDER — BUPIVACAINE-EPINEPHRINE (PF) 0.25% -1:200000 IJ SOLN
INTRAMUSCULAR | Status: AC
  Filled 2021-05-06: qty 30

## 2021-05-06 MED ORDER — LACTATED RINGERS IV BOLUS: 250.0000 mL | Freq: Once | INTRAVENOUS | Status: DC

## 2021-05-06 MED ORDER — DOCUSATE SODIUM 100 MG PO CAPS
100.0000 mg | ORAL_CAPSULE | Freq: Two times a day (BID) | ORAL | Status: DC
  Administered 2021-05-07: 100 mg via ORAL
  Filled 2021-05-06 (×2): qty 1

## 2021-05-06 MED ORDER — PROPOFOL 500 MG/50ML IV EMUL
INTRAVENOUS | Status: DC | PRN
  Administered 2021-05-06: 75 ug/kg/min via INTRAVENOUS

## 2021-05-06 MED ORDER — DEXAMETHASONE SODIUM PHOSPHATE 10 MG/ML IJ SOLN
8.0000 mg | Freq: Once | INTRAMUSCULAR | Status: AC
  Administered 2021-05-06: 8 mg via INTRAVENOUS

## 2021-05-06 MED ORDER — FENTANYL CITRATE (PF) 100 MCG/2ML IJ SOLN
INTRAMUSCULAR | Status: AC
  Filled 2021-05-06: qty 2

## 2021-05-06 MED ORDER — ORAL CARE MOUTH RINSE: 15.0000 mL | Freq: Once | OROMUCOSAL | Status: AC

## 2021-05-06 MED ORDER — FENTANYL CITRATE (PF) 100 MCG/2ML IJ SOLN
INTRAMUSCULAR | Status: DC | PRN
  Administered 2021-05-06 (×2): 50 ug via INTRAVENOUS

## 2021-05-06 MED ORDER — BUPIVACAINE-EPINEPHRINE 0.25% -1:200000 IJ SOLN
INTRAMUSCULAR | Status: DC | PRN
  Administered 2021-05-06: 30 mL

## 2021-05-06 MED ORDER — PROMETHAZINE HCL 25 MG/ML IJ SOLN: 6.2500 mg | INTRAMUSCULAR | Status: DC | PRN

## 2021-05-06 MED ORDER — BUPIVACAINE LIPOSOME 1.3 % IJ SUSP
INTRAMUSCULAR | Status: AC
  Filled 2021-05-06: qty 20

## 2021-05-06 MED ORDER — DIPHENHYDRAMINE HCL 12.5 MG/5ML PO ELIX
12.5000 mg | ORAL_SOLUTION | ORAL | Status: DC | PRN
  Administered 2021-05-06 – 2021-05-07 (×2): 25 mg via ORAL
  Filled 2021-05-06 (×2): qty 10

## 2021-05-06 MED ORDER — CEFUROXIME SODIUM 1.5 G IV SOLR
INTRAVENOUS | Status: AC
  Filled 2021-05-06: qty 1.5

## 2021-05-06 MED ORDER — METOCLOPRAMIDE HCL 5 MG PO TABS: 5.0000 mg | ORAL_TABLET | Freq: Three times a day (TID) | ORAL | Status: DC | PRN

## 2021-05-06 MED ORDER — BUPIVACAINE LIPOSOME 1.3 % IJ SUSP: 20.0000 mL | Freq: Once | INTRAMUSCULAR | Status: DC

## 2021-05-06 MED ORDER — SODIUM CHLORIDE (PF) 0.9 % IJ SOLN
INTRAMUSCULAR | Status: DC | PRN
  Administered 2021-05-06: 50 mL

## 2021-05-06 MED ORDER — CEFAZOLIN SODIUM-DEXTROSE 2-4 GM/100ML-% IV SOLN
2.0000 g | Freq: Four times a day (QID) | INTRAVENOUS | Status: AC
  Administered 2021-05-06 (×2): 2 g via INTRAVENOUS
  Filled 2021-05-06 (×2): qty 100

## 2021-05-06 MED ORDER — PROPOFOL 10 MG/ML IV BOLUS
INTRAVENOUS | Status: DC | PRN
  Administered 2021-05-06 (×2): 20 mg via INTRAVENOUS

## 2021-05-06 MED ORDER — POTASSIUM CHLORIDE IN NACL 20-0.9 MEQ/L-% IV SOLN
INTRAVENOUS | Status: DC
  Filled 2021-05-06 (×2): qty 1000

## 2021-05-06 MED ORDER — DEXAMETHASONE SODIUM PHOSPHATE 10 MG/ML IJ SOLN
INTRAMUSCULAR | Status: DC | PRN
  Administered 2021-05-06: 5 mg

## 2021-05-06 SURGICAL SUPPLY — 57 items
ATTUNE MED DOME PAT 32 KNEE (Knees) ×2 IMPLANT
ATTUNE PS FEM LT SZ 4 CEM KNEE (Femur) ×2 IMPLANT
ATTUNE PSRP INSR SZ4 7 KNEE (Insert) ×2 IMPLANT
BAG COUNTER SPONGE SURGICOUNT (BAG) IMPLANT
BAG ZIPLOCK 12X15 (MISCELLANEOUS) ×2 IMPLANT
BASEPLATE TIBIAL ROTATING SZ 4 (Knees) ×2 IMPLANT
BLADE SAGITTAL 13X1.27X60 (BLADE) ×2 IMPLANT
BLADE SAGITTAL 25.0X1.19X90 (BLADE) ×2 IMPLANT
BLADE SAW SGTL 13X75X1.27 (BLADE) ×2 IMPLANT
BLADE SURG SZ10 CARB STEEL (BLADE) ×4 IMPLANT
BOWL SMART MIX CTS (DISPOSABLE) ×2 IMPLANT
CEMENT HV SMART SET (Cement) ×4 IMPLANT
COVER SURGICAL LIGHT HANDLE (MISCELLANEOUS) ×2 IMPLANT
CUFF TOURN SGL QUICK 34 (TOURNIQUET CUFF) ×2
CUFF TRNQT CYL 34X4.125X (TOURNIQUET CUFF) ×1 IMPLANT
DECANTER SPIKE VIAL GLASS SM (MISCELLANEOUS) ×4 IMPLANT
DRAPE INCISE IOBAN 66X45 STRL (DRAPES) ×6 IMPLANT
DRAPE ORTHO 2.5IN SPLIT 77X108 (DRAPES) ×1 IMPLANT
DRAPE ORTHO SPLIT 77X108 STRL (DRAPES) ×2
DRAPE SHEET LG 3/4 BI-LAMINATE (DRAPES) ×2 IMPLANT
DRAPE U-SHAPE 47X51 STRL (DRAPES) ×2 IMPLANT
DRSG AQUACEL AG ADV 3.5X 6 (GAUZE/BANDAGES/DRESSINGS) ×2 IMPLANT
DRSG AQUACEL AG ADV 3.5X10 (GAUZE/BANDAGES/DRESSINGS) ×2 IMPLANT
DRSG PAD ABDOMINAL 8X10 ST (GAUZE/BANDAGES/DRESSINGS) ×4 IMPLANT
DURAPREP 26ML APPLICATOR (WOUND CARE) ×4 IMPLANT
ELECT REM PT RETURN 15FT ADLT (MISCELLANEOUS) ×2 IMPLANT
FACESHIELD WRAPAROUND (MASK) ×4 IMPLANT
GLOVE SURG ENC MOIS LTX SZ7 (GLOVE) ×2 IMPLANT
GLOVE SURG MICRO LTX SZ7.5 (GLOVE) ×2 IMPLANT
GLOVE SURG UNDER POLY LF SZ7 (GLOVE) ×2 IMPLANT
GLOVE SURG UNDER POLY LF SZ7.5 (GLOVE) ×2 IMPLANT
GOWN STRL REUS W/ TWL LRG LVL3 (GOWN DISPOSABLE) ×2 IMPLANT
GOWN STRL REUS W/TWL LRG LVL3 (GOWN DISPOSABLE) ×4
HANDPIECE INTERPULSE COAX TIP (DISPOSABLE) ×2
HOLDER FOLEY CATH W/STRAP (MISCELLANEOUS) IMPLANT
HOOD PEEL AWAY FLYTE STAYCOOL (MISCELLANEOUS) ×6 IMPLANT
KIT TURNOVER KIT A (KITS) IMPLANT
MANIFOLD NEPTUNE II (INSTRUMENTS) ×2 IMPLANT
MARKER SKIN DUAL TIP RULER LAB (MISCELLANEOUS) ×2 IMPLANT
NDL SAFETY ECLIPSE 18X1.5 (NEEDLE) ×1 IMPLANT
NEEDLE HYPO 18GX1.5 SHARP (NEEDLE) ×2
NS IRRIG 1000ML POUR BTL (IV SOLUTION) ×2 IMPLANT
PACK TOTAL KNEE CUSTOM (KITS) ×2 IMPLANT
PROTECTOR NERVE ULNAR (MISCELLANEOUS) ×2 IMPLANT
SET HNDPC FAN SPRY TIP SCT (DISPOSABLE) ×1 IMPLANT
STAPLER VISISTAT 35W (STAPLE) IMPLANT
STRIP CLOSURE SKIN 1/2X4 (GAUZE/BANDAGES/DRESSINGS) ×2 IMPLANT
SUT MNCRL AB 3-0 PS2 18 (SUTURE) ×2 IMPLANT
SUT VIC AB 0 CT1 36 (SUTURE) ×2 IMPLANT
SUT VIC AB 1 CT1 36 (SUTURE) ×2 IMPLANT
SUT VIC AB 2-0 CT1 27 (SUTURE) ×6
SUT VIC AB 2-0 CT1 TAPERPNT 27 (SUTURE) ×3 IMPLANT
SYR 20ML LL LF (SYRINGE) ×4 IMPLANT
SYR 30ML LL (SYRINGE) ×4 IMPLANT
TAPE STRIPS DRAPE STRL (GAUZE/BANDAGES/DRESSINGS) ×2 IMPLANT
TRAY FOLEY MTR SLVR 14FR STAT (SET/KITS/TRAYS/PACK) ×2 IMPLANT
WATER STERILE IRR 1000ML POUR (IV SOLUTION) ×4 IMPLANT

## 2021-05-06 NOTE — Anesthesia Postprocedure Evaluation (Signed)
Anesthesia Post Note  Patient: [name]  Procedure(s) Performed: TOTAL KNEE ARTHROPLASTY (Left: Knee)     Patient location during evaluation: PACU Anesthesia Type: MAC and Spinal Level of consciousness: awake and alert Pain management: pain level controlled Vital Signs Assessment: post-procedure vital signs reviewed and stable Respiratory status: spontaneous breathing and respiratory function stable Cardiovascular status: blood pressure returned to baseline and stable Postop Assessment: spinal receding Anesthetic complications: no   No notable events documented.  Last Vitals:  Vitals:   05/06/21 1030 05/06/21 1043  BP: 125/81 118/75  Pulse: 64 62  Resp: 20 16  Temp:  37.1 C  SpO2: 98% 98%    Last Pain:  Vitals:   05/06/21 1043  TempSrc: Oral  PainSc: 0-No pain                 Augustino Savastano DANIEL

## 2021-05-06 NOTE — Evaluation (Signed)
Physical Therapy Evaluation Patient Details Name: [name] MRN: [id number] DOB: 1965-07-15 Today's Date: 05/06/2021  History of Present Illness  Patient is 55 y.o. female s/p Lt TKA on 05/06/21 with PMH significant for OA, HTN, depression, HLD, obesity, scoliosis.    Clinical Impression  [name] is a 55 y.o. female POD 0 s/p Lt TKA. Patient reports independence with mobility at baseline. Patient is now limited by functional impairments (see PT problem list below) and requires min guard/assist for transfers and gait with RW. Patient was able to ambulate short distance with RW and min guard/assist to step from Bed>BSC>recliner. Patient instructed in exercises to facilitate ROM and circulation to manage edema to reduce risk of DVT. Patient will benefit from continued skilled PT interventions to address impairments and progress towards PLOF. Acute PT will follow to progress mobility and stair training in preparation for safe discharge home.     Recommendations for follow up therapy are one component of a multi-disciplinary discharge planning process, led by the attending physician.  Recommendations may be updated based on patient status, additional functional criteria and insurance authorization.  Follow Up Recommendations Follow physician's recommendations for discharge plan and follow up therapies    Assistance Recommended at Discharge Intermittent Supervision/Assistance  Functional Status Assessment Patient has had a recent decline in their functional status and demonstrates the ability to make significant improvements in function in a reasonable and predictable amount of time.  Equipment Recommendations  None recommended by PT    Recommendations for Other Services       Precautions / Restrictions Precautions Precautions: Fall Restrictions Weight Bearing Restrictions: No Other Position/Activity Restrictions: WBAT      Mobility  Bed Mobility Overal bed mobility: Needs  Assistance Bed Mobility: Supine to Sit     Supine to sit: Min guard;HOB elevated     General bed mobility comments: cues to use bed rail, guarding for safety and pt taking extra time to sit up to EOB.    Transfers Overall transfer level: Needs assistance Equipment used: Rolling walker (2 wheels) Transfers: Sit to/from Stand Sit to Stand: Min assist;Min guard           General transfer comment: cues for hand placement/technique at walker for power up. minm guard for safety. cues for sequencing steps to move bed>BSC>recliner and assist for walker positioning.    Ambulation/Gait Ambulation/Gait assistance: Min guard Gait Distance (Feet): 6 Feet Assistive device: Rolling walker (2 wheels) Gait Pattern/deviations: Step-to pattern;Decreased stride length;Decreased weight shift to left Gait velocity: decr   General Gait Details: cues for step sequence and proximity to RW to turn and amb from [facility] to recliner. further gait deferred due to pain.  Stairs            Wheelchair Mobility    Modified Rankin (Stroke Patients Only)       Balance Overall balance assessment: Needs assistance Sitting-balance support: Feet supported;Bilateral upper extremity supported Sitting balance-Leahy Scale: Fair     Standing balance support: Reliant on assistive device for balance;During functional activity;Bilateral upper extremity supported Standing balance-Leahy Scale: Poor                               Pertinent Vitals/Pain Pain Assessment: 0-10 Pain Score: 5  Pain Location: Lt knee Pain Descriptors / Indicators: Aching;Discomfort Pain Intervention(s): Limited activity within patient's tolerance;Monitored during session;Repositioned;Ice applied    Home Living Family/patient expects to be discharged to:: Private residence  Living Arrangements: Other relatives;Non-relatives/Friends Available Help at Discharge: Family Type of Home: House Home Access: Stairs to  enter Entrance Stairs-Rails: Lawyer of Steps: 2   Home Layout: One level Home Equipment: None      Prior Function Prior Level of Function : Independent/Modified Independent;Working/employed             Mobility Comments: 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, civil (consulting), night shift       Hand Dominance   Dominant Hand: Right    Extremity/Trunk Assessment   Upper Extremity Assessment Upper Extremity Assessment: Overall WFL for tasks assessed    Lower Extremity Assessment Lower Extremity Assessment: LLE deficits/detail LLE Deficits / Details: good quad activation, no extensor lag with SLR LLE Sensation: WNL LLE Coordination: WNL    Cervical / Trunk Assessment Cervical / Trunk Assessment: Normal  Communication   Communication: No difficulties  Cognition Arousal/Alertness: Awake/alert Behavior During Therapy: WFL for tasks assessed/performed Overall Cognitive Status: Within Functional Limits for tasks assessed                                          General Comments      Exercises Total Joint Exercises Ankle Circles/Pumps: AROM;Both;20 reps;Seated Quad Sets: Left;5 reps;Seated;AROM Heel Slides: Left;5 reps;Seated;AROM   Assessment/Plan    PT Assessment Patient needs continued PT services  PT Problem List Decreased strength;Decreased range of motion;Decreased activity tolerance;Decreased balance;Decreased knowledge of use of DME;Decreased mobility;Decreased knowledge of precautions;Decreased safety awareness;Pain       PT Treatment Interventions DME instruction;Gait training;Stair training;Functional mobility training;Therapeutic activities;Therapeutic exercise;Balance training;Patient/family education    PT Goals (Current goals can be found in the Care Plan section)  Acute Rehab PT Goals Patient Stated Goal: regain independence PT Goal Formulation: With patient Time For Goal Achievement: 05/13/21 Potential to Achieve  Goals: Good    Frequency 7X/week   Barriers to discharge        Co-evaluation               AM-PAC PT "6 Clicks" Mobility  Outcome Measure Help needed turning from your back to your side while in a flat bed without using bedrails?: A Little Help needed moving from lying on your back to sitting on the side of a flat bed without using bedrails?: A Little Help needed moving to and from a bed to a chair (including a wheelchair)?: A Little Help needed standing up from a chair using your arms (e.g., wheelchair or bedside chair)?: A Little Help needed to walk in hospital room?: A Little Help needed climbing 3-5 steps with a railing? : A Lot 6 Click Score: 17    End of Session Equipment Utilized During Treatment: Gait belt Activity Tolerance: Patient tolerated treatment well Patient left: in chair;with call bell/phone within reach;with chair alarm set Nurse Communication: Mobility status PT Visit Diagnosis: Difficulty in walking, not elsewhere classified (R26.2);Muscle weakness (generalized) (M62.81)    Time: 0301-3143 PT Time Calculation (min) (ACUTE ONLY): 47 min   Charges:   PT Evaluation $PT Eval Low Complexity: 1 Low PT Treatments $Therapeutic Exercise: 8-22 mins $Therapeutic Activity: 8-22 mins        Wynn Maudlin, DPT Acute Rehabilitation Services Office [phone] Pager [phone]   [name] 05/06/2021, 3:16 PM

## 2021-05-06 NOTE — Progress Notes (Signed)
Orthopedic Tech Progress Note Patient Details:  [name] 1966-02-06 [id number]  CPM Left Knee CPM Left Knee: On Left Knee Flexion (Degrees): 90 Left Knee Extension (Degrees): 0 Additional Comments: foot roll  Post Interventions Patient Tolerated: Well Instructions Provided: Care of device  [name] 05/06/2021, 9:34 AM

## 2021-05-06 NOTE — Anesthesia Procedure Notes (Signed)
Spinal  Patient location during procedure: OR Start time: 05/06/2021 7:14 AM End time: 05/06/2021 7:21 AM Reason for block: surgical anesthesia Staffing Performed: anesthesiologist  Anesthesiologist: Heather Roberts, MD Preanesthetic Checklist Completed: patient identified, IV checked, risks and benefits discussed, surgical consent, monitors and equipment checked, pre-op evaluation and timeout performed Spinal Block Patient position: sitting Prep: DuraPrep Patient monitoring: cardiac monitor, continuous pulse ox and blood pressure Approach: midline Location: L2-3 Injection technique: single-shot Needle Needle type: Pencan  Needle gauge: 24 G Needle length: 9 cm Assessment Events: CSF return Additional Notes Functioning IV was confirmed and monitors were applied. Sterile prep and drape, including hand hygiene and sterile gloves were used. The patient was positioned and the spine was prepped. The skin was anesthetized with lidocaine.  Free flow of clear CSF was obtained prior to injecting local anesthetic into the CSF.  The spinal needle aspirated freely following injection.  The needle was carefully withdrawn.  The patient tolerated the procedure well.

## 2021-05-06 NOTE — Interval H&P Note (Signed)
History and Physical Interval Note:  05/06/2021 6:08 AM  [name]  has presented today for surgery, with the diagnosis of DJD LEFT KNEE.  The various methods of treatment have been discussed with the patient and family. After consideration of risks, benefits and other options for treatment, the patient has consented to  Procedure(s): TOTAL KNEE ARTHROPLASTY (Left) as a surgical intervention.  The patient's history has been reviewed, patient examined, no change in status, stable for surgery.  I have reviewed the patient's chart and labs.  Questions were answered to the patient's satisfaction.     [name]

## 2021-05-06 NOTE — Transfer of Care (Signed)
Immediate Anesthesia Transfer of Care Note  Patient: [name]  Procedure(s) Performed: TOTAL KNEE ARTHROPLASTY (Left: Knee)  Patient Location: PACU  Anesthesia Type:Spinal  Level of Consciousness: awake, alert  and oriented  Airway & Oxygen Therapy: Patient Spontanous Breathing and Patient connected to face mask oxygen  Post-op Assessment: Report given to RN and Post -op Vital signs reviewed and stable  Post vital signs: Reviewed and stable  Last Vitals:  Vitals Value Taken Time  BP 126/81 05/06/21 0912  Temp    Pulse 63 05/06/21 0913  Resp 11 05/06/21 0913  SpO2 100 % 05/06/21 0913  Vitals shown include unvalidated device data.  Last Pain:  Vitals:   05/06/21 0547  TempSrc:   PainSc: 0-No pain         Complications: No notable events documented.

## 2021-05-06 NOTE — Plan of Care (Signed)
  Problem: Activity: Goal: Ability to avoid complications of mobility impairment will improve Outcome: Progressing Goal: Range of joint motion will improve Outcome: Progressing   Problem: Pain Management: Goal: Pain level will decrease with appropriate interventions Outcome: Progressing   

## 2021-05-06 NOTE — Op Note (Signed)
MRN:     [id number] DOB/AGE:    1966/04/06 / 55 y.o.       OPERATIVE REPORT   DATE OF PROCEDURE:  05/06/2021      PREOPERATIVE DIAGNOSIS:   Primary Localized Osteoarthritis left Knee       Estimated body mass index is 35.08 kg/m as calculated from the following:   Height as of this encounter: 5\' 4"  (1.626 m).   Weight as of this encounter: 92.7 kg.                                                       POSTOPERATIVE DIAGNOSIS:   Same                                                                 PROCEDURE:  Procedure(s): TOTAL KNEE ARTHROPLASTY Using Depuy Attune RP implants #4 Femur, #4Tibia, 19mm  RP bearing, 32 Patella    SURGEON: Natalin Bible A. 11m, MD   ASSISTANT: Thurston Hole, PA-C, present and scrubbed throughout the case, critical for retraction, instrumentation, and closure.  ANESTHESIA: Spinal with Adductor Nerve Block  TOURNIQUET TIME: 55 minutes   COMPLICATIONS:  None       SPECIMENS: None   INDICATIONS FOR PROCEDURE: The patient has djd of the knee with varus deformities, XR shows bone on bone arthritis. Patient has failed all conservative measures including anti-inflammatory medicines, narcotics, attempts at exercise and weight loss, cortisone injections and viscosupplementation.  Risks and benefits of surgery have been discussed, questions answered.    DESCRIPTION OF PROCEDURE: The patient identified by armband, received right adductor canal block and IV antibiotics, in the holding area at [facility]. Patient taken to the operating room, appropriate anesthetic monitors were attached. Spinal anesthesia induced with the patient in supine position, Foley catheter was inserted. Tourniquet applied high to the operative thigh. Lateral post and foot positioner applied to the table, the lower extremity was then prepped and draped in usual sterile fashion from the ankle to the tourniquet. Time-out procedure was performed. The limb was wrapped with an Esmarch bandage and the  tourniquet inflated to 365 mmHg.   We began the operation by making a 6cm anterior midline incision. Small bleeders in the skin and the subcutaneous tissue identified and cauterized. Transverse retinaculum was incised and reflected medially and a medial parapatellar arthrotomy was accomplished. the patella was everted and theprepatellar fat pad resected. The superficial medial collateral ligament was then elevated from anterior to posterior along the proximal flare of the tibia and anterior half of the menisci resected. The knee was hyperflexed exposing bone on bone arthritis. Peripheral and notch osteophytes as well as the cruciate ligaments were then resected. We continued to work our way around posteriorly along the proximal tibia, and externally rotated the tibia subluxing it out from underneath the femur. A McHale retractor was placed through the notch and a lateral Hohmann retractor placed, and an external tibial guide was placed.  The tibial cutting guide was pinned into place allowing resection of 4 mm of bone medially and about 6 mm of bone laterally because of her varus  deformity.   Satisfied with the tibial resection, we then entered the distal femur 2 mm anterior to the PCL origin with the intramedullary guide rod and applied the distal femoral cutting guide set at 68mm, with 5 degrees of valgus. This was pinned along the epicondylar axis. At this point, the distal femoral cut was accomplished without difficulty. We then sized for a 4 femoral component and pinned the guide in 3 degrees of external rotation.The chamfer cutting guide was pinned into place. The anterior, posterior, and chamfer cuts were accomplished without difficulty followed by the  RP box cutting guide and the box cut. We also removed posterior osteophytes from the posterior femoral condyles. At this time, the knee was brought into full extension. We checked our extension and flexion gaps and found them symmetric at 7.  The patella  thickness measured at 54m m. We set the cutting guide at 13 and removed the posterior patella sized for 32 button and drilled the lollipop. The knee was then once again hyperflexed exposing the proximal tibia. We sized for a # 4 tibial base plate, applied the smokestack and the conical reamer followed by the the Delta fin keel punch. We then hammered into place the  RP trial femoral component, inserted a trial bearing, trial patellar button, and took the knee through range of motion from 0-130 degrees. No thumb pressure was required for patellar tracking.   At this point, all trial components were removed, a double batch of DePuy HV cement and Zinacef 1.5gms was mixed and applied to all bony metallic mating surfaces. In order, we hammered into place the tibial tray and removed excess cement, the femoral component and removed excess cement, a 7 mm  RP bearing was inserted, and the knee brought to full extension with compression. The patellar button was clamped into place, and excess cement removed. While the cement cured the wound was irrigated out with normal saline solution pulse lavage, and exparel was injected throughout the knee. Ligament stability and patellar tracking were checked and found to be excellent..   The parapatellar arthrotomy was closed with  #1 Vicryl suture. The subcutaneous tissue with 0 and 2-0 undyed Vicryl suture, and 4-0 Monocryl.. A dressing of Aquaseal, 4 x 4, dressing sponges, Webril, and Ace wrap applied. Needle and sponge count were correct times 2.The patient awakened, extubated, and taken to recovery room without difficulty. Vascular status was normal, pulses 2+ and symmetric.    [name] 09/28/2017, 8:56 AM

## 2021-05-06 NOTE — Anesthesia Procedure Notes (Signed)
Anesthesia Regional Block: Adductor canal block   Pre-Anesthetic Checklist: , timeout performed,  Correct Patient, Correct Site, Correct Laterality,  Correct Procedure, Correct Position, site marked,  Risks and benefits discussed,  Surgical consent,  Pre-op evaluation,  At surgeon's request and post-op pain management  Laterality: Left  Prep: chloraprep       Needles:  Injection technique: Single-shot  Needle Type: Stimulator Needle - 80     Needle Length: 10cm  Needle Gauge: 21     Additional Needles:   Narrative:  Start time: 05/06/2021 6:47 AM End time: 05/06/2021 6:55 AM Injection made incrementally with aspirations every 5 mL.  Performed by: Personally  Anesthesiologist: Heather Roberts, MD

## 2021-05-07 ENCOUNTER — Other Ambulatory Visit (HOSPITAL_COMMUNITY): Payer: Self-pay

## 2021-05-07 ENCOUNTER — Encounter (HOSPITAL_COMMUNITY): Payer: Self-pay | Admitting: Orthopedic Surgery

## 2021-05-07 DIAGNOSIS — Z96652 Presence of left artificial knee joint: Secondary | ICD-10-CM | POA: Diagnosis not present

## 2021-05-07 DIAGNOSIS — M1712 Unilateral primary osteoarthritis, left knee: Secondary | ICD-10-CM | POA: Diagnosis not present

## 2021-05-07 DIAGNOSIS — I10 Essential (primary) hypertension: Secondary | ICD-10-CM | POA: Diagnosis not present

## 2021-05-07 DIAGNOSIS — Z79899 Other long term (current) drug therapy: Secondary | ICD-10-CM | POA: Diagnosis not present

## 2021-05-07 LAB — CBC
HCT: 38 % (ref 36.0–46.0)
Hemoglobin: 12.6 g/dL (ref 12.0–15.0)
MCH: 28.4 pg (ref 26.0–34.0)
MCHC: 33.2 g/dL (ref 30.0–36.0)
MCV: 85.8 fL (ref 80.0–100.0)
Platelets: 294 10*3/uL (ref 150–400)
RBC: 4.43 MIL/uL (ref 3.87–5.11)
RDW: 13.9 % (ref 11.5–15.5)
WBC: 20.6 10*3/uL — ABNORMAL HIGH (ref 4.0–10.5)
nRBC: 0 % (ref 0.0–0.2)

## 2021-05-07 LAB — BASIC METABOLIC PANEL
Anion gap: 8 (ref 5–15)
BUN: 15 mg/dL (ref 6–20)
CO2: 25 mmol/L (ref 22–32)
Calcium: 8.9 mg/dL (ref 8.9–10.3)
Chloride: 102 mmol/L (ref 98–111)
Creatinine, Ser: 0.55 mg/dL (ref 0.44–1.00)
GFR, Estimated: >60 mL/min (ref >60)
Glucose, Bld: 158 mg/dL — ABNORMAL HIGH (ref 70–99)
Potassium: 4.2 mmol/L (ref 3.5–5.1)
Sodium: 135 mmol/L (ref 135–145)

## 2021-05-07 MED ORDER — OXYCODONE HCL 5 MG PO TABS
5.0000 mg | ORAL_TABLET | ORAL | 0 refills | Status: AC | PRN
  Filled 2021-05-07: qty 42, 7d supply, fill #0

## 2021-05-07 MED ORDER — ACETAMINOPHEN 500 MG PO TABS
1000.0000 mg | ORAL_TABLET | Freq: Three times a day (TID) | ORAL | 1 refills | Status: AC | PRN
  Filled 2021-05-07: qty 100, 17d supply, fill #0

## 2021-05-07 MED ORDER — GABAPENTIN 300 MG PO CAPS
300.0000 mg | ORAL_CAPSULE | Freq: Every day | ORAL | 1 refills | Status: AC
  Filled 2021-05-07: qty 30, 30d supply, fill #0

## 2021-05-07 MED ORDER — DOCUSATE SODIUM 100 MG PO CAPS
ORAL_CAPSULE | ORAL | 0 refills | Status: AC
  Filled 2021-05-07: qty 10, fill #0

## 2021-05-07 MED ORDER — ASPIRIN 325 MG PO TBEC
DELAYED_RELEASE_TABLET | ORAL | 0 refills | Status: AC
  Filled 2021-05-07: qty 30, 30d supply, fill #0

## 2021-05-07 MED ORDER — POLYETHYLENE GLYCOL 3350 17 G PO PACK
PACK | ORAL | 0 refills | Status: AC
  Filled 2021-05-07: qty 14, fill #0

## 2021-05-07 MED ORDER — DOXYCYCLINE HYCLATE 100 MG PO TABS
100.0000 mg | ORAL_TABLET | Freq: Two times a day (BID) | ORAL | 0 refills | Status: DC
  Filled 2021-05-07: qty 20, 10d supply, fill #0

## 2021-05-07 MED ORDER — CELECOXIB 200 MG PO CAPS
ORAL_CAPSULE | ORAL | 3 refills | Status: AC
  Filled 2021-05-07: qty 30, 30d supply, fill #0
  Filled 2021-10-16: qty 30, 30d supply, fill #1
  Filled 2022-01-15: qty 30, 30d supply, fill #2

## 2021-05-07 NOTE — Progress Notes (Signed)
Orthopedic Tech Progress Note Patient Details:  [name] [birth year] [id number]  Patient ID: [name], female   DOB: 02-Oct-1965, 55 y.o.   MRN: [id number] Viewed chart to see if pt had dc. [place] A Dhruv [name] 05/07/2021, 11:14 AM

## 2021-05-07 NOTE — Plan of Care (Signed)
Pt ready to DC home with family. 

## 2021-05-07 NOTE — Progress Notes (Signed)
Physical Therapy Treatment Patient Details Name: [name] MRN: [id number] DOB: 1965-11-13 Today's Date: 05/07/2021   History of Present Illness Patient is 55 y.o. female s/p Lt TKA on 05/06/21 with PMH significant for OA, HTN, depression, HLD, obesity, scoliosis.    PT Comments    POD # 1 am session General Comments: AxO x 3 pleasant and motivated.  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 Assisted with amb in hallway.  General Gait Details: pt stated she already "walked the unit" with PA and navigated a flight of stairs. Practiced 2 steps.  General stair comments: 25% VC's on proper tech up forward with walker as pt does not have "true" rails but a couple of posts. Assisted to bathroom.  Then returned to room to perform some TE's following HEP handout.  Instructed on proper tech, freq as well as use of ICE.   Addressed all mobility questions, discussed appropriate activity, educated on use of ICE.  Pt ready for D/C to home.   Recommendations for follow up therapy are one component of a multi-disciplinary discharge planning process, led by the attending physician.  Recommendations may be updated based on patient status, additional functional criteria and insurance authorization.  Follow Up Recommendations  Outpatient PT     Assistance Recommended at Discharge Intermittent Supervision/Assistance  Equipment Recommendations  None recommended by PT    Recommendations for Other Services       Precautions / Restrictions Precautions Precautions: Fall Precaution Comments: instructed no pillow under knee Restrictions Weight Bearing Restrictions: No LLE Weight Bearing: Weight bearing as tolerated     Mobility  Bed Mobility               General bed mobility comments: OOB in recliner    Transfers Overall transfer level: Needs assistance Equipment used: Rolling walker (2 wheels) Transfers: Sit to/from [employer] Transfers Sit to Stand:  Supervision Stand pivot transfers: Supervision         General transfer comment: one VC on safety with turns otherwise good use of hands to steady self.    Ambulation/Gait Ambulation/Gait assistance: Supervision Gait Distance (Feet): 115 Feet Assistive device: Rolling walker (2 wheels) Gait Pattern/deviations: Step-to pattern;Decreased stride length;Decreased weight shift to left Gait velocity: decreased   General Gait Details: pt stated she already "walked the unit" with PA and navigated a flight of stairs.   Stairs Stairs: Yes Stairs assistance: Supervision Stair Management: No rails;Step to pattern;Forwards Number of Stairs: 2 General stair comments: 25% VC's on proper tech up forward with walker as pt does not have "true" rails but a couple of posts.   Wheelchair Mobility    Modified Rankin (Stroke Patients Only)       Balance                                            Cognition Arousal/Alertness: Awake/alert Behavior During Therapy: WFL for tasks assessed/performed Overall Cognitive Status: Within Functional Limits for tasks assessed                                 General Comments: AxO x 3 pleasant and motivated.  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        Exercises  Total Knee Replacement TE's following HEP handout  10 reps B LE ankle pumps 05 reps towel squeezes 05 reps knee presses 05 reps heel slides  05 reps SAQ's 05 reps SLR's 05 reps ABD Educated on use of gait belt to assist with TE's Followed by ICE     General Comments        Pertinent Vitals/Pain Pain Assessment: 0-10 Pain Location: Lt knee Pain Descriptors / Indicators: Aching;Discomfort;Operative site guarding Pain Intervention(s): Monitored during session;Repositioned;Ice applied;Premedicated before session    Home Living                          Prior Function            PT Goals (current goals can now be found  in the care plan section) Progress towards PT goals: Progressing toward goals    Frequency    7X/week      PT Plan Current plan remains appropriate    Co-evaluation              AM-PAC PT "6 Clicks" Mobility   Outcome Measure  Help needed turning from your back to your side while in a flat bed without using bedrails?: A Little Help needed moving from lying on your back to sitting on the side of a flat bed without using bedrails?: A Little Help needed moving to and from a bed to a chair (including a wheelchair)?: A Little Help needed standing up from a chair using your arms (e.g., wheelchair or bedside chair)?: A Little Help needed to walk in hospital room?: A Little Help needed climbing 3-5 steps with a railing? : A Little 6 Click Score: 18    End of Session Equipment Utilized During Treatment: Gait belt Activity Tolerance: Patient tolerated treatment well Patient left: in chair;with call bell/phone within reach;with chair alarm set Nurse Communication: Mobility status PT Visit Diagnosis: Difficulty in walking, not elsewhere classified (R26.2);Muscle weakness (generalized) (M62.81)     Time: 9509-3267 PT Time Calculation (min) (ACUTE ONLY): 38 min  Charges:  $Gait Training: 8-22 mins $Therapeutic Exercise: 8-22 mins $Therapeutic Activity: 8-22 mins                     Felecia Shelling  PTA Acute  Rehabilitation Services Pager      [phone] Office      [phone]

## 2021-05-07 NOTE — Discharge Summary (Signed)
Patient ID: [name] MRN: [id number] DOB/AGE: 07/07/1966 55 y.o.  Admit date: 05/06/2021 Discharge date: 05/07/2021  Admission Diagnoses:  Principal Problem:   Primary localized osteoarthritis of left knee Active Problems:   Benign hypertension   Major depression   Mixed hyperlipidemia   Obesity   Radicular syndrome of left leg   Scoliosis of lumbar region due to degenerative disease of spine in adult   S/P total knee arthroplasty, left   Discharge Diagnoses:  Same  Past Medical History:  Diagnosis Date   Acute medial meniscus tear of left knee 08/21/2020   Allergic rhinitis due to pollen 08/21/2020   Benign hypertension 08/21/2020   Genital warts 08/21/2020   Hematuria 08/21/2020   History of kidney stones    Major depression 08/21/2020   Mixed hyperlipidemia 08/21/2020   Obesity 08/21/2020   Pre-diabetes    Primary localized osteoarthritis of left knee 04/18/2021   Primary localized osteoarthritis of left knee    Radicular syndrome of left leg 04/23/2021   Scoliosis of lumbar region due to degenerative disease of spine in adult 04/23/2021   Sleep apnea    Somnolence 08/21/2020   Tobacco abuse 08/21/2020    Surgeries: Procedure(s): TOTAL KNEE ARTHROPLASTY on 05/06/2021   Consultants:   Discharged Condition: Improved  Hospital Course: [name] is an 55 y.o. female who was admitted 05/06/2021 for operative treatment ofPrimary localized osteoarthritis of left knee. Patient has severe unremitting pain that affects sleep, daily activities, and work/hobbies. After pre-op clearance the patient was taken to the operating room on 05/06/2021 and underwent  Procedure(s): TOTAL KNEE ARTHROPLASTY.    Patient was given perioperative antibiotics:  Anti-infectives (From admission, onward)    Start     Dose/Rate Route Frequency Ordered Stop   05/06/21 1300  ceFAZolin (ANCEF) IVPB 2g/100 mL premix        2 g 200 mL/hr over 30 Minutes Intravenous Every 6 hours  05/06/21 1048 05/06/21 1919   05/06/21 0823  cefUROXime (ZINACEF) 1.5 g in sodium chloride 0.9 % 100 mL IVPB        over 30 Minutes  Continuous PRN 05/06/21 0823 05/06/21 0823   05/06/21 0600  ceFAZolin (ANCEF) IVPB 2g/100 mL premix        2 g 200 mL/hr over 30 Minutes Intravenous On call to O.R. 05/06/21 [id number] 05/06/21 [id number]        Patient was given sequential compression devices, early ambulation, and chemoprophylaxis to prevent DVT.  Patient benefited maximally from hospital stay and there were no complications.    Recent vital signs: Patient Vitals for the past 24 hrs:  BP Temp Temp src Pulse Resp SpO2  05/07/21 0617 114/70 97.7 F (36.5 C) Oral 62 16 96 %  05/07/21 0204 132/76 97.8 F (36.6 C) Oral 60 16 96 %  05/06/21 2140 128/76 98.1 F (36.7 C) Oral 65 16 96 %  05/06/21 1758 (!) 143/83 97.6 F (36.4 C) Oral 68 18 96 %  05/06/21 1244 128/82 98.9 F (37.2 C) Oral 70 18 93 %  05/06/21 1043 118/75 98.8 F (37.1 C) Oral 62 16 98 %  05/06/21 1030 125/81 -- -- 64 20 98 %  05/06/21 1015 127/84 97.6 F (36.4 C) -- 64 (!) 23 97 %  05/06/21 1000 (!) 125/93 -- -- 64 (!) 21 98 %  05/06/21 0945 128/78 -- -- (!) 58 16 99 %  05/06/21 0930 124/76 -- -- (!) 59 18 100 %  05/06/21 0920 -- -- --  64 19 99 %  05/06/21 0915 124/71 97.6 F (36.4 C) -- 63 20 100 %     Recent laboratory studies:  Recent Labs    05/07/21 0331  WBC 20.6*  HGB 12.6  HCT 38.0  PLT 294  NA 135  K 4.2  CL 102  CO2 25  BUN 15  CREATININE 0.55  GLUCOSE 158*  CALCIUM 8.9     Discharge Medications:   Allergies as of 05/07/2021       Reactions   Codeine Itching   Lisinopril    Other reaction(s): cough   Penicillins Nausea Only   GIVEN ANCEF WITH NO REACTION   Singulair [montelukast] Other (See Comments)   Headaches/migraines   Erythromycin Rash        Medication List     STOP taking these medications    cephALEXin 500 MG capsule Commonly known as: KEFLEX   cyclobenzaprine 5 MG  tablet Commonly known as: FLEXERIL   glucosamine-chondroitin 500-400 MG tablet       TAKE these medications    acetaminophen 500 MG tablet Commonly known as: TYLENOL Take 2 tablets (1,000 mg total) by mouth every 8 (eight) hours as needed for mild pain (or temp > 100.5).   aspirin 325 MG EC tablet TAKE 1 TABLET BY MOUTH DAILY FOR 30 DAYS TO PREVENT BLOOD CLOTS   TAKE IT 12 HRS APART FROM CELECOXIB   celecoxib 200 MG capsule Commonly known as: CELEBREX DO NOT START THIS MEDICATION UNTIL 05/16/2021 THEN TAKE 1 CAPSULE BY MOUTH ONCE DAILY WITH FOOD FOR PAIN AND SWELLING What changed: additional instructions   diclofenac Sodium 1 % Gel Commonly known as: VOLTAREN Apply 1 application topically 2 (two) times daily as needed (pain).   docusate sodium 100 MG capsule Commonly known as: COLACE TAKE 1 CAPSULE TWICE A DAY WHILE ON NARCOTICS  **STOOL SOFTENER   doxycycline 100 MG tablet Commonly known as: VIBRA-TABS Take 1 tablet by mouth twice a day with a meal   fluticasone 50 MCG/ACT nasal spray Commonly known as: FLONASE Place 1 spray into both nostrils daily.   gabapentin 300 MG capsule Commonly known as: Neurontin Take 1 capsule (300 mg total) by mouth at bedtime for nerve pain.   metoprolol succinate 50 MG 24 hr tablet Commonly known as: TOPROL-XL TAKE 1 TABLET BY MOUTH ONCE DAILY   mupirocin ointment 2 % Commonly known as: BACTROBAN Apply small pea size amount in each nostril using a clean Qtip twice a day for 14 days.  Then day 1 - 5 of each month for 6 months   oxyCODONE 5 MG immediate release tablet Commonly known as: Oxy IR/ROXICODONE Take 1 tablet by mouth every 4 (four) hours as needed for up to 7 days for moderate pain or severe pain.   polyethylene glycol 17 g packet Commonly known as: MIRALAX / GLYCOLAX DRINK 17 GRAMS MIXED IN 6 OUNCES OF SOMETHING TO DRINK TWICE A DAY   venlafaxine XR 75 MG 24 hr capsule Commonly known as: EFFEXOR-XR Take 2 capsules by  mouth once daily What changed:  how much to take when to take this Another medication with the same name was removed. Continue taking this medication, and follow the directions you see here.               Discharge Care Instructions  (From admission, onward)           Start     Ordered   05/07/21 0000  Change dressing  Comments: DO NOT REMOVE BANDAGE OVER SURGICAL INCISION.  [place] WHOLE LEG INCLUDING OVER THE WATERPROOF BANDAGE WITH SOAP AND WATER EVERY DAY.   05/07/21 0815            Diagnostic Studies: No results found.  Disposition: Discharge disposition: 01-Home or Self Care      Discharge Instructions     CPM   Complete by: As directed    Continuous passive motion machine (CPM):      Use the CPM from 0 to 90 for 6 hours per day.       You may break it up into 2 or 3 sessions per day.      Use CPM for 2 weeks or until you are told to stop.   Call MD / Call 911   Complete by: As directed    If you experience chest pain or shortness of breath, CALL 911 and be transported to the hospital emergency room.  If you develope a fever above 101 F, pus (white drainage) or increased drainage or redness at the wound, or calf pain, call your surgeon's office.   Change dressing   Complete by: As directed    DO NOT REMOVE BANDAGE OVER SURGICAL INCISION.  [place] WHOLE LEG INCLUDING OVER THE WATERPROOF BANDAGE WITH SOAP AND WATER EVERY DAY.   Constipation Prevention   Complete by: As directed    Drink plenty of fluids.  Prune juice may be helpful.  You may use a stool softener, such as Colace (over the counter) 100 mg twice a day.  Use MiraLax (over the counter) for constipation as needed.   Diet - low sodium heart healthy   Complete by: As directed    Discharge instructions   Complete by: As directed    INSTRUCTIONS AFTER JOINT REPLACEMENT   Remove items at home which could result in a fall. This includes throw rugs or furniture in walking pathways You may notice  swelling that will progress down to the foot and ankle.  This is normal after surgery.  Elevate your leg when you are not up walking on it.   Continue to use the breathing machine you got in the hospital (incentive spirometer) which will help keep your temperature down.  It is common for your temperature to cycle up and down following surgery, especially at night when you are not up moving around and exerting yourself.  The breathing machine keeps your lungs expanded and your temperature down.   DIET:  As you were doing prior to hospitalization, we recommend a well-balanced diet.  DRESSING / WOUND CARE / SHOWERING  Keep the surgical dressing until follow up.  The dressing is water proof, so you can shower without any extra covering.  IF THE DRESSING FALLS OFF or the wound gets wet inside, change the dressing with sterile gauze.  Please use good hand washing techniques before changing the dressing.  Do not use any lotions or creams on the incision until instructed by your surgeon.    ACTIVITY  Increase activity slowly as tolerated, but follow the weight bearing instructions below.   No driving for 6 weeks or until further direction given by your physician.  You cannot drive while taking narcotics.  No lifting or carrying greater than 10 lbs. until further directed by your surgeon. Avoid periods of inactivity such as sitting longer than an hour when not asleep. This helps prevent blood clots.  You may return to work once you are authorized by your doctor.  WEIGHT BEARING   Weight bearing as tolerated with assist device (walker, cane, etc) as directed, use it as long as suggested by your surgeon or therapist, typically at least 1-2weeks.   EXERCISES  Results after joint replacement surgery are often greatly improved when you follow the exercise, range of motion and muscle strengthening exercises prescribed by your doctor. Safety measures are also important to protect the joint from further  injury. Any time any of these exercises cause you to have increased pain or swelling, decrease what you are doing until you are comfortable again and then slowly increase them. If you have problems or questions, call your caregiver or physical therapist for advice.   Rehabilitation is important following a joint replacement. After just a few days of immobilization, the muscles of the leg can become weakened and shrink (atrophy).  These exercises are designed to build up the tone and strength of the thigh and leg muscles and to improve motion. Often times heat used for twenty to thirty minutes before working out will loosen up your tissues and help with improving the range of motion but do not use heat for the first two weeks following surgery (sometimes heat can increase post-operative swelling).   These exercises can be done on a training (exercise) mat, on the floor, on a table or on a bed. Use whatever works the best and is most comfortable for you.    Use music or television while you are exercising so that the exercises are a pleasant break in your day. This will make your life better with the exercises acting as a break in your routine that you can look forward to.   Perform all exercises about fifteen times, three times per day or as directed.  You should exercise both the operative leg and the other leg as well.  Exercises include:   Quad Sets - Tighten up the muscle on the front of the thigh (Quad) and hold for 5-10 seconds.   Straight Leg Raises - With your knee straight (if you were given a brace, keep it on), lift the leg to 60 degrees, hold for 3 seconds, and slowly lower the leg.  Perform this exercise against resistance later as your leg gets stronger.  Leg Slides: Lying on your back, slowly slide your foot toward your buttocks, bending your knee up off the floor (only go as far as is comfortable). Then slowly slide your foot back down until your leg is flat on the floor again.  Angel  Wings: Lying on your back spread your legs to the side as far apart as you can without causing discomfort.  Hamstring Strength:  Lying on your back, push your heel against the floor with your leg straight by tightening up the muscles of your buttocks.  Repeat, but this time bend your knee to a comfortable angle, and push your heel against the floor.  You may put a pillow under the heel to make it more comfortable if necessary.   A rehabilitation program following joint replacement surgery can speed recovery and prevent re-injury in the future due to weakened muscles. Contact your doctor or a physical therapist for more information on knee rehabilitation.    CONSTIPATION  Constipation is defined medically as fewer than three stools per week and severe constipation as less than one stool per week.  Even if you have a regular bowel pattern at home, your normal regimen is likely to be disrupted due to multiple reasons following surgery.  Combination  of anesthesia, postoperative narcotics, change in appetite and fluid intake all can affect your bowels.   YOU MUST use at least one of the following options; they are listed in order of increasing strength to get the job done.  They are all available over the counter, and you may need to use some, POSSIBLY even all of these options:    Drink plenty of fluids (prune juice may be helpful) and high fiber foods Colace 100 mg by mouth twice a day  Senokot for constipation as directed and as needed Dulcolax (bisacodyl), take with full glass of water  Miralax (polyethylene glycol) once or twice a day as needed.  If you have tried all these things and are unable to have a bowel movement in the first 3-4 days after surgery call either your surgeon or your primary doctor.    If you experience loose stools or diarrhea, hold the medications until you stool forms back up.  If your symptoms do not get better within 1 week or if they get worse, check with your doctor.   If you experience "the worst abdominal pain ever" or develop nausea or vomiting, please contact the office immediately for further recommendations for treatment.   ITCHING:  If you experience itching with your medications, try taking only a single pain pill, or even half a pain pill at a time.  You can also use Benadryl over the counter for itching or also to help with sleep.   TED HOSE STOCKINGS:  Use stockings on both legs until for at least 2 weeks or as directed by physician office. They may be removed at night for sleeping.  MEDICATIONS:  See your medication summary on the "After Visit Summary" that nursing will review with you.  You may have some home medications which will be placed on hold until you complete the course of blood thinner medication.  It is important for you to complete the blood thinner medication as prescribed.  PRECAUTIONS:  If you experience chest pain or shortness of breath - call 911 immediately for transfer to the hospital emergency department.   If you develop a fever greater that 101 F, purulent drainage from wound, increased redness or drainage from wound, foul odor from the wound/dressing, or calf pain - CONTACT YOUR SURGEON.                                                   FOLLOW-UP APPOINTMENTS:  If you do not already have a post-op appointment, please call the office for an appointment to be seen by your surgeon.  Guidelines for how soon to be seen are listed in your "After Visit Summary", but are typically between 1-4 weeks after surgery.  OTHER INSTRUCTIONS:   Knee Replacement:  Do not place pillow under knee, focus on keeping the knee straight while resting. CPM instructions: 0-90 degrees, 2 hours in the morning, 2 hours in the afternoon, and 2 hours in the evening. Place foam block, curve side up under heel at all times except when in CPM or when walking.  DO NOT modify, tear, cut, or change the foam block in any way.  POST-OPERATIVE OPIOID TAPER  INSTRUCTIONS: It is important to wean off of your opioid medication as soon as possible. If you do not need pain medication after your surgery it is ok to stop  day one. Opioids include: Codeine, Hydrocodone(Norco, Vicodin), Oxycodone(Percocet, oxycontin) and hydromorphone amongst others.  Long term and even short term use of opiods can cause: Increased pain response Dependence Constipation Depression Respiratory depression And more.  Withdrawal symptoms can include Flu like symptoms Nausea, vomiting And more Techniques to manage these symptoms Hydrate well Eat regular healthy meals Stay active Use relaxation techniques(deep breathing, meditating, yoga) Do Not substitute Alcohol to help with tapering If you have been on opioids for less than two weeks and do not have pain than it is ok to stop all together.  Plan to wean off of opioids This plan should start within one week post op of your joint replacement. Maintain the same interval or time between taking each dose and first decrease the dose.  Cut the total daily intake of opioids by one tablet each day Next start to increase the time between doses. The last dose that should be eliminated is the evening dose.     MAKE SURE YOU:  Understand these instructions.  Get help right away if you are not doing well or get worse.    Thank you for letting us be a part of your medical care team.  It is a privilege we respect greatly.  We hope these instructions will help you stay on track for a fast and full recovery!   Do not put a pillow under the knee. Place it under the heel.   Complete by: As directed    Place gray foam block, curve side up under heel at all times except when in CPM or when walking.  DO NOT modify, tear, cut, or change in any way the gray foam block.   Increase activity slowly as tolerated   Complete by: As directed    Patient may shower   Complete by: As directed    Aquacel dressing is water proof    Wash over  it and the whole leg with soap and water at the end of your shower   Post-operative opioid taper instructions:   Complete by: As directed    POST-OPERATIVE OPIOID TAPER INSTRUCTIONS: It is important to wean off of your opioid medication as soon as possible. If you do not need pain medication after your surgery it is ok to stop day one. Opioids include: Codeine, Hydrocodone(Norco, Vicodin), Oxycodone(Percocet, oxycontin) and hydromorphone amongst others.  Long term and even short term use of opiods can cause: Increased pain response Dependence Constipation Depression Respiratory depression And more.  Withdrawal symptoms can include Flu like symptoms Nausea, vomiting And more Techniques to manage these symptoms Hydrate well Eat regular healthy meals Stay active Use relaxation techniques(deep breathing, meditating, yoga) Do Not substitute Alcohol to help with tapering If you have been on opioids for less than two weeks and do not have pain than it is ok to stop all together.  Plan to wean off of opioids This plan should start within one week post op of your joint replacement. Maintain the same interval or time between taking each dose and first decrease the dose.  Cut the total daily intake of opioids by one tablet each day Next start to increase the time between doses. The last dose that should be eliminated is the evening dose.      TED hose   Complete by: As directed    Use stockings (TED hose) for 2 weeks on both leg(s).  You may remove them at night for sleeping.        Follow-up Information   OUTPATIENT REHABILITATION Follow up on 05/08/2021.   Why: APPT 05/08/2021 AT 2 PM WITH TREY AT Meredeth Ide, MD Follow up on 05/16/2021.   Specialty: Orthopedic Surgery Contact information: [address] [place] [address] [phone]                  Signed: Pascal Lux 05/07/2021, 8:23 AM

## 2021-05-07 NOTE — TOC Transition Note (Signed)
Transition of Care [facility]) - CM/SW Discharge Note  Patient Details  Name: [name] MRN: [id number] Date of Birth: Jan 29, 1966  [facility]) CM/SW Contact:  Sherie Don, LCSW Phone Number: 05/07/2021, 9:50 AM  Clinical Narrative: Patient is expected to discharge after working with PT. CSW met with patient to confirm discharge plan. Patient will go to OPPT at [facility] on [address]. Patient has a rolling walker, shower chair, cane, and 3N1 at home so there are no DME needs at this time. TOC signing off.  Final next level of care: OP Rehab Barriers to Discharge: No Barriers Identified  Patient Goals and CMS Choice Patient states their goals for this hospitalization and ongoing recovery are:: Discharge home with OPPT Choice offered to / list presented to : NA  Discharge Plan and Services        DME Arranged: N/A DME Agency: NA  Readmission Risk Interventions No flowsheet data found.

## 2021-05-08 ENCOUNTER — Other Ambulatory Visit: Payer: Self-pay

## 2021-05-08 ENCOUNTER — Encounter (HOSPITAL_BASED_OUTPATIENT_CLINIC_OR_DEPARTMENT_OTHER): Payer: Self-pay | Admitting: Physical Therapy

## 2021-05-08 ENCOUNTER — Ambulatory Visit (HOSPITAL_BASED_OUTPATIENT_CLINIC_OR_DEPARTMENT_OTHER): Payer: 59 | Attending: Physician Assistant | Admitting: Physical Therapy

## 2021-05-08 DIAGNOSIS — M25662 Stiffness of left knee, not elsewhere classified: Secondary | ICD-10-CM | POA: Insufficient documentation

## 2021-05-08 DIAGNOSIS — M1712 Unilateral primary osteoarthritis, left knee: Secondary | ICD-10-CM | POA: Diagnosis not present

## 2021-05-08 DIAGNOSIS — G8929 Other chronic pain: Secondary | ICD-10-CM | POA: Insufficient documentation

## 2021-05-08 DIAGNOSIS — M79605 Pain in left leg: Secondary | ICD-10-CM | POA: Diagnosis not present

## 2021-05-08 DIAGNOSIS — M545 Low back pain, unspecified: Secondary | ICD-10-CM | POA: Diagnosis not present

## 2021-05-08 DIAGNOSIS — M6281 Muscle weakness (generalized): Secondary | ICD-10-CM | POA: Diagnosis not present

## 2021-05-08 DIAGNOSIS — M25562 Pain in left knee: Secondary | ICD-10-CM | POA: Diagnosis not present

## 2021-05-08 DIAGNOSIS — R262 Difficulty in walking, not elsewhere classified: Secondary | ICD-10-CM | POA: Diagnosis not present

## 2021-05-08 DIAGNOSIS — M4150 Other secondary scoliosis, site unspecified: Secondary | ICD-10-CM | POA: Insufficient documentation

## 2021-05-08 NOTE — Therapy (Signed)
OUTPATIENT PHYSICAL THERAPY LOWER EXTREMITY EVALUATION   Patient Name: [name] MRN: [id number] DOB:May 05, 1966, 55 y.o., female Today's Date: 05/09/2021   PT End of Session - 05/08/21 1536     Visit Number 1    Number of Visits 20    Date for PT Re-Evaluation 07/03/21    Progress Note Due on Visit --   06/07/21   PT Start Time 1437    PT Stop Time 1532    PT Time Calculation (min) 55 min    Activity Tolerance Patient tolerated treatment well    Behavior During Therapy [facility] for tasks assessed/performed             Past Medical History:  Diagnosis Date   Acute medial meniscus tear of left knee 08/21/2020   Allergic rhinitis due to pollen 08/21/2020   Benign hypertension 08/21/2020   Genital warts 08/21/2020   Hematuria 08/21/2020   History of kidney stones    Major depression 08/21/2020   Mixed hyperlipidemia 08/21/2020   Obesity 08/21/2020   Pre-diabetes    Primary localized osteoarthritis of left knee 04/18/2021   Primary localized osteoarthritis of left knee    Radicular syndrome of left leg 04/23/2021   Scoliosis of lumbar region due to degenerative disease of spine in adult 04/23/2021   Sleep apnea    Somnolence 08/21/2020   Tobacco abuse 08/21/2020   Past Surgical History:  Procedure Laterality Date   ANKLE FRACTURE SURGERY     CESAREAN SECTION     FRACTURE SURGERY     HAND SURGERY     KNEE ARTHROSCOPY WITH MEDIAL MENISECTOMY Left 09/04/2020   Procedure: KNEE ARTHROSCOPY WITH MEDIAL MENISECTOMY;  Surgeon: Elsie Saas, MD;  Location: [place];  Service: Orthopedics;  Laterality: Left;   ROTATOR CUFF REPAIR     TOTAL KNEE ARTHROPLASTY Left 05/06/2021   Procedure: TOTAL KNEE ARTHROPLASTY;  Surgeon: Elsie Saas, MD;  Location: [facility];  Service: Orthopedics;  Laterality: Left;   TUBAL LIGATION     Patient Active Problem List   Diagnosis Date Noted   S/P total knee arthroplasty, left 05/06/2021   Radicular syndrome of left leg  04/23/2021   Scoliosis of lumbar region due to degenerative disease of spine in adult 04/23/2021   Primary localized osteoarthritis of left knee 04/18/2021   Allergic rhinitis due to pollen 08/21/2020   Benign hypertension 08/21/2020   Genital warts 08/21/2020   Hematuria 08/21/2020   Major depression 08/21/2020   Mixed hyperlipidemia 08/21/2020   Obesity 08/21/2020   Somnolence 08/21/2020   Tobacco abuse 08/21/2020   Acute medial meniscus tear of left knee 08/21/2020    PCP: Mayra Neer, MD  REFERRING PROVIDER: Matthew Saras, PA*; Surgeon:  Dr. Marquette Saa DIAG: M17.12 (ICD-10-CM) - Primary localized osteoarthrosis of the knee, left  M54.50,M79.605 (ICD-10-CM) - Low back pain radiating to left leg  M41.50 (ICD-10-CM) - Scoliosis due to degenerative disease of spine in adult patient   THERAPY DIAG:  Pain in L knee Joint stiffness in L knee Muscle weakness Difficulty in walking Presence of artificial knee replacement  ONSET DATE: 05/06/2021 L TKA  SUBJECTIVE:   SUBJECTIVE STATEMENT: Pt had L knee scope/meniscus surgery on Mar 07/2020. Pt received PT for L knee from March - July and had little improvement.  She continued to have L knee pain.  She underwent L TKA on 05/06/2021.  Her worst pain is with CPM. She is using blue foam pad/bone foam for knee extension.  Pt  received exercises from hospital PT to perform at home.  Pt is ambulating with a FWW.  She is limited with all of her normal functional mobility skills including ambulation, transfers and stairs.  Pt is limited with standing duration and unable to perform her normal household chores.  Pt is unable to perform her normal work activities.  Pt received ESI at L3-4 and L4-5 on October 25th which did provide pain relief.    PERTINENT HISTORY: L3 disc bulge and scoliosis.  R RCR, L ankle surgery.  PAIN:  Are you having pain? Yes NRPS scale: 7/10 current, 10+/10 worst, 4-5/10 best Pain location: L  knee Aggravating factors:  Relieving factors: meds, ice  PRECAUTIONS: Other: Per TKA protocol.  Recent back issues  WEIGHT BEARING RESTRICTIONS WBAT  FALLS:  Has patient fallen in last 6 months? No    OCCUPATION: pt is nurse at [employer].   Pt ambulated without an AD.  PATIENT GOALS to be able to go for a nice long walk   OBJECTIVE:     PATIENT SURVEYS:  FOTO 26  COGNITION:  Overall cognitive status: Within functional limits for tasks assessed     GIRTH MEASUREMENT: (cm) Mid patella:  R: 39.1, L: 44.0    OBSERVATION: Pt wearing compression stocking and has aquacell dressing over incision.  A couple of tiny small spots of drainage at proximal bandage, nothing abnormal.  Pt has some increased redness/pink along medial mid aquacell  PALPATION:  TTP at lateral L knee.  No significant tenderness in medial knee.  none in calf.     LE AROM/PROM:  A/PROM Right 05/09/2021 Left 05/09/2021  Hip flexion    Hip extension    Hip abduction    Hip adduction    Hip internal rotation    Hip external rotation    Knee flexion 130 56 deg passive  Knee extension WNL with hyperextension 6/2  Ankle dorsiflexion    Ankle plantarflexion    Ankle inversion    Ankle eversion     (Blank rows = not tested)  LE MMT:  MMT Right 05/09/2021 Left 05/09/2021  Hip flexion    Hip extension    Hip abduction    Hip adduction    Hip internal rotation    Hip external rotation    Knee flexion    Knee extension Normal quad set Fair (-) Quad set  Ankle dorsiflexion    Ankle plantarflexion    Ankle inversion    Ankle eversion     (Blank rows = not tested)    GAIT: Assistive device utilized: Environmental consultant - 2 wheeled.  Pt's walker was too low.  PT adjusted the walker though the walker was not tall enough to fit pt appropriately.  Educated pt and daughter with appropriate fit and height of walker.   Comments: Antalgic gait, decreased stance time on L knee, increased WB'ing  thru bilat UE's, decreased heel strike and toe off on L.    TODAY'S TREATMENT: Reviewed current HEP from hospital.  Pt performed quad sets with 5 sec hold x 10 reps and AP's x 20 reps.  Pt received a HEP handout and was educated in correct form and appropriate frequency.     PATIENT EDUCATION:  Education details:  Pt was educated in dx, POC, protocol expectations, and post op restrictions.  Pt instructed to not put a pillow under knee.  Educated pt concerning knee flexion and extension ROM and the importance of focusing on ROM.  Appropriate walker height.  Instructed pt in using ice.   Person educated: Patent and daughter Education method: Explanation, Demonstration, Tactile cues, Verbal cues, and Handouts Education comprehension: verbalized understanding, returned demonstration, verbal cues required, and tactile cues required   HOME EXERCISE PROGRAM: Access Code: [email or web address] URL: [email or web address] Date: 05/08/2021 Prepared by: Ronny Flurry  Exercises Supine Quadricep Sets - 2-3 x daily - 7 x weekly - 2 sets - 10 reps - 5 seconds hold ANKLE PUMPS - 1 x daily - 7 x weekly - 3 sets - 10 reps Also instructed pt to sit with knee flexed at various times t/o the day (4-6x) for 3-5 mins.  Informed pt it should not be painful or cause bleeding.   ASSESSMENT:  CLINICAL IMPRESSION: Patient is a 55 y.o. female 2 days s/p L TKA presenting tot he clinic with expected post op findings of L knee pain, joint stiffness in L knee, swelling in L knee, muscle weakness in L LE, and difficulty in walking.  Pt is ambulating with a FWW.  She is limited with all of her normal functional mobility skills including ambulation, transfers and stairs.  Pt is limited with standing duration and unable to perform her normal household chores.  Pt is unable to perform her normal work activities.  Objective impairments include Abnormal gait, decreased activity tolerance, decreased balance, decreased  endurance, decreased mobility, difficulty walking, decreased ROM, decreased strength, hypomobility, increased edema, impaired flexibility, and pain. These impairments are limiting patient from cleaning, community activity, driving, meal prep, occupation, laundry, and shopping. Personal factors including  current back pain  are also affecting patient's functional outcome. Patient will benefit from skilled PT to address above impairments and improve overall function.  REHAB POTENTIAL: Good  CLINICAL DECISION MAKING: Stable/uncomplicated  EVALUATION COMPLEXITY: Low   GOALS:   SHORT TERM GOALS:  STG Name Target Date Goal status  1 Pt will be independent and compliant with HEP for improved ROM, pain, strength, and function. Baseline:  05/29/2021 INITIAL  2 Pt will demo a good quad set and perform supine SLR with no > than minimal quad lag for improved strength and stability with gait.  Baseline:  11/23/20222 INITIAL  3 Pt will demo improved L knee AROM/PROM to 0 - 90/95 deg for improved mobility and stiffness Baseline: 06/05/2021 INITIAL  4 Pt will demo improved TKE, toe off, and Wb'ing thru L LE with gait. Baseline: 05/29/2021 INITIAL  5 Pt will ambulate in community with [place] with good stability without significant pain Baseline: 05/29/2021 INITIAL             LONG TERM GOALS:   LTG Name Target Date Goal status  1 Pt will demo L knee AROM to be 0 - 115 deg for improved stiffness, mobility, and performance of stairs. Baseline: 07/03/2021 INITIAL  2 Pt will ambulate extended community distance without significant pain or difficulty.  Baseline: 07/03/2021 INITIAL  3 Pt will ascend and descend stairs with a reciprocal gait with 1 rail without significant pain.  Baseline: 07/03/2021 INITIAL  4 Pt will demo improved L hip flexion strength to 5/5, hip abd strength to 4/5 MMT, and knee flex/ext strength to 5/5 MMT for improved tolerance to functional mobility and daily activities.   Baseline: 07/03/2021 INITIAL  5 Pt will be able to perform her normal daily transfers without significant difficulty.  Baseline: 07/03/2021 INITIAL  6 Pt will ambulate with a normalized heel to toe gait pattern without limping.  Baseline: 06/20/2021 INITIAL  7 Pt  will be able to perform her ADLs and IADLs without significant lumbar pain or  07/03/2021 INITIAL   PLAN: PT FREQUENCY:  3x/wk x 3 weeks, 2-3x/wk x 1 week, and 2x/wk x 4 weeks  PT DURATION: 8 weeks  PLANNED INTERVENTIONS: Therapeutic exercises, Therapeutic activity, Neuro Muscular re-education, Balance training, Gait training, Patient/Family education, Joint mobilization, Stair training, Electrical stimulation, Spinal mobilization, Cryotherapy, Moist heat, scar mobilization, Taping, and Manual therapy  PLAN FOR NEXT SESSION: Cont per TKA protocol.  Cont with quad activation, ROM, MT, and gait training.  Ice to reduce pain and soreness   Audie Clear III PT, DPT 05/09/21 8:54 PM

## 2021-05-10 ENCOUNTER — Ambulatory Visit (HOSPITAL_BASED_OUTPATIENT_CLINIC_OR_DEPARTMENT_OTHER): Payer: 59 | Admitting: Physical Therapy

## 2021-05-10 ENCOUNTER — Other Ambulatory Visit: Payer: Self-pay

## 2021-05-10 ENCOUNTER — Encounter (HOSPITAL_BASED_OUTPATIENT_CLINIC_OR_DEPARTMENT_OTHER): Payer: Self-pay | Admitting: Physical Therapy

## 2021-05-10 DIAGNOSIS — M25662 Stiffness of left knee, not elsewhere classified: Secondary | ICD-10-CM

## 2021-05-10 DIAGNOSIS — G8929 Other chronic pain: Secondary | ICD-10-CM | POA: Diagnosis not present

## 2021-05-10 DIAGNOSIS — M25562 Pain in left knee: Secondary | ICD-10-CM | POA: Diagnosis not present

## 2021-05-10 DIAGNOSIS — R262 Difficulty in walking, not elsewhere classified: Secondary | ICD-10-CM | POA: Diagnosis not present

## 2021-05-10 DIAGNOSIS — M6281 Muscle weakness (generalized): Secondary | ICD-10-CM | POA: Diagnosis not present

## 2021-05-10 DIAGNOSIS — M1712 Unilateral primary osteoarthritis, left knee: Secondary | ICD-10-CM | POA: Diagnosis not present

## 2021-05-10 DIAGNOSIS — M4150 Other secondary scoliosis, site unspecified: Secondary | ICD-10-CM | POA: Diagnosis not present

## 2021-05-10 DIAGNOSIS — M545 Low back pain, unspecified: Secondary | ICD-10-CM | POA: Diagnosis not present

## 2021-05-10 DIAGNOSIS — M79605 Pain in left leg: Secondary | ICD-10-CM | POA: Diagnosis not present

## 2021-05-10 NOTE — Therapy (Signed)
OUTPATIENT PHYSICAL THERAPY TREATMENT NOTE   Patient Name: [name] MRN: [id number] DOB:1966-02-09, 55 y.o., female Today's Date: 05/10/2021  PCP: Mayra Neer, MD REFERRING PROVIDER: Mayra Neer, MD   PT End of Session - 05/10/21 [phone]     Visit Number 2    Number of Visits 20    Date for PT Re-Evaluation 07/03/21    Progress Note Due on Visit --   06/07/21   PT Start Time 0805    PT Stop Time 0848    PT Time Calculation (min) 43 min    Activity Tolerance Patient tolerated treatment well    Behavior During Therapy [facility] for tasks assessed/performed             Past Medical History:  Diagnosis Date   Acute medial meniscus tear of left knee 08/21/2020   Allergic rhinitis due to pollen 08/21/2020   Benign hypertension 08/21/2020   Genital warts 08/21/2020   Hematuria 08/21/2020   History of kidney stones    Major depression 08/21/2020   Mixed hyperlipidemia 08/21/2020   Obesity 08/21/2020   Pre-diabetes    Primary localized osteoarthritis of left knee 04/18/2021   Primary localized osteoarthritis of left knee    Radicular syndrome of left leg 04/23/2021   Scoliosis of lumbar region due to degenerative disease of spine in adult 04/23/2021   Sleep apnea    Somnolence 08/21/2020   Tobacco abuse 08/21/2020   Past Surgical History:  Procedure Laterality Date   ANKLE FRACTURE SURGERY     CESAREAN SECTION     FRACTURE SURGERY     HAND SURGERY     KNEE ARTHROSCOPY WITH MEDIAL MENISECTOMY Left 09/04/2020   Procedure: KNEE ARTHROSCOPY WITH MEDIAL MENISECTOMY;  Surgeon: Elsie Saas, MD;  Location: [place];  Service: Orthopedics;  Laterality: Left;   ROTATOR CUFF REPAIR     TOTAL KNEE ARTHROPLASTY Left 05/06/2021   Procedure: TOTAL KNEE ARTHROPLASTY;  Surgeon: Elsie Saas, MD;  Location: [facility];  Service: Orthopedics;  Laterality: Left;   TUBAL LIGATION     Patient Active Problem List   Diagnosis Date Noted   S/P total knee arthroplasty,  left 05/06/2021   Radicular syndrome of left leg 04/23/2021   Scoliosis of lumbar region due to degenerative disease of spine in adult 04/23/2021   Primary localized osteoarthritis of left knee 04/18/2021   Allergic rhinitis due to pollen 08/21/2020   Benign hypertension 08/21/2020   Genital warts 08/21/2020   Hematuria 08/21/2020   Major depression 08/21/2020   Mixed hyperlipidemia 08/21/2020   Obesity 08/21/2020   Somnolence 08/21/2020   Tobacco abuse 08/21/2020   Acute medial meniscus tear of left knee 08/21/2020   REFERRING PROVIDER: Matthew Saras, PA*; Surgeon:  Dr. Marquette Saa DIAG: M17.12 (ICD-10-CM) - Primary localized osteoarthrosis of the knee, left  M54.50,M79.605 (ICD-10-CM) - Low back pain radiating to left leg  M41.50 (ICD-10-CM) - Scoliosis due to degenerative disease of spine in adult patient    THERAPY DIAG:  Pain in L knee Joint stiffness in L knee Muscle weakness Difficulty in walking Presence of artificial knee replacement   ONSET DATE: 05/06/2021 L TKA   SUBJECTIVE:    SUBJECTIVE STATEMENT: -Pt had L knee scope/meniscus surgery on Mar 07/2020. Pt received PT for L knee from March - July and had little improvement.  She continued to have L knee pain.  She underwent L TKA on 05/06/2021.  Pt is ambulating with a FWW.  She is limited  with all of her normal functional mobility skills including ambulation, transfers and stairs.  Pt is limited with standing duration and unable to perform her normal household chores.  Pt is unable to perform her normal work activities.  -Pt received ESI at L3-4 and L4-5 on October 25th which did provide pain relief.    -Pt states she feels better today than prior Rx.  Pt reports compliance with HEP.  Pt is using the bone foam pad and also the CPM.  Pt states she has been performing HEP from hospital including heel slides.   PERTINENT HISTORY: L3 disc bulge and scoliosis.  R RCR, L ankle surgery.   PAIN:  Are you  having pain? Yes NRPS scale:  4-5/10 currently Pain location: L knee Aggravating factors:  Relieving factors: meds, ice   PRECAUTIONS: Other: Per TKA protocol.  Recent back issues   WEIGHT BEARING RESTRICTIONS WBAT    PATIENT GOALS to be able to go for a nice long walk     OBJECTIVE:        OBSERVATION: Pt has aquacell dressing over incision.  A couple of small spots of drainage at proximal bandage, nothing abnormal.  Pt has decreased redness/pink along medial mid aquacell.  Pt has bruising in medial knee lower leg anteriorly and anteriorlaterally.         L knee AROM: 4 - 69 deg  L LE Strength:     Pt has fair (-) quad set.  Pt unable to perform supine SLR and SAQ.     GAIT: Assistive device utilized: Environmental consultant - 2 wheeled.  Pt's walker was too low.  PT adjusted the walker though the walker was not tall enough to fit pt appropriately.  Educated pt and daughter with appropriate fit and height of walker.   Comments: Antalgic gait, decreased stance time on L knee, increased WB'ing thru bilat UE's, decreased heel strike and toe off on L.       TODAY'S TREATMENT: Therapeutic Exercise: -Reviewed pain level, response to prior Rx, and HEP compliance.  -Observed strength and assessed ROM  -Pt performed: -quad sets with 5 sec hold x 15 reps  -Ankle pumps x 20 reps.   -supine hip abd 2x10 reps   -supine heel slides 2x10 reps -seated knee extension stretch with heel propped on stool x 2 mins -Pt received supine L knee extension PROM.  Pt received L knee flexion PROM seated at EOT.     PATIENT EDUCATION:  Education details:  Reviewed and Updated HEP.  Pt instructed to not perform supine heel slides into a painful or tight range and she should not have bleeding.  Pt was educated in dx, POC, protocol expectations, and post op restrictions.  Pt instructed to not put a pillow under knee.  Educated pt concerning knee flexion and extension ROM and the importance of focusing on ROM.   Instructed pt in using ice.   Person educated: Special educational needs teacher: Explanation, Demonstration, Tactile cues, Verbal cues, and Handouts Education comprehension: verbalized understanding, returned demonstration, verbal cues required, and tactile cues required     HOME EXERCISE PROGRAM: Access Code: [email or web address] URL: [email or web address] Date: 05/10/2021 Prepared by: Ronny Flurry  Exercises Supine Quadricep Sets - 2-3 x daily - 7 x weekly - 2 sets - 10 reps - 5 seconds hold ANKLE PUMPS - 1 x daily - 7 x weekly - 3 sets - 10 reps Supine Hip Abduction AROM - 2 x daily - 7 x weekly - 2 sets -  10 reps Seated Knee Extension Stretch with Chair - 2-3 x daily - 7 x weekly - 1 sets - 2-3 reps - 2 minutes hold Supine Heel Slide - 2-3 x daily - 7 x weekly - 1 sets - 10 reps    ASSESSMENT:   CLINICAL IMPRESSION: Patient presents to Rx stating she is feeling better than prior visit.  Pt demonstrates improved L knee flexion and extension AROM.  She tolerated exercises per protocol well today.  PT updated HEP and pt demonstrates good understanding.  She has expected quad strength deficits as evidenced by inability to perform a SAQ or supine SLR.  Pt responded well to Rx reporting no increased pain after Rx and having no c/o's after Rx.  Pt will benefit from cont skilled PT services to address goals and impairments and improve overall function.     Objective impairments include Abnormal gait, decreased activity tolerance, decreased balance, decreased endurance, decreased mobility, difficulty walking, decreased ROM, decreased strength, hypomobility, increased edema, impaired flexibility, and pain. These impairments are limiting patient from cleaning, community activity, driving, meal prep, occupation, laundry, and shopping. Personal factors including  current back pain  are also affecting patient's functional outcome.       GOALS:     SHORT TERM GOALS:   STG Name Target Date Goal  status  1 Pt will be independent and compliant with HEP for improved ROM, pain, strength, and function. Baseline:  05/29/2021 INITIAL  2 Pt will demo a good quad set and perform supine SLR with no > than minimal quad lag for improved strength and stability with gait.  Baseline:  11/23/20222 INITIAL  3 Pt will demo improved L knee AROM/PROM to 0 - 90/95 deg for improved mobility and stiffness Baseline: 06/05/2021 INITIAL  4 Pt will demo improved TKE, toe off, and Wb'ing thru L LE with gait. Baseline: 05/29/2021 INITIAL  5 Pt will ambulate in community with [place] with good stability without significant pain Baseline: 05/29/2021 INITIAL                      LONG TERM GOALS:    LTG Name Target Date Goal status  1 Pt will demo L knee AROM to be 0 - 115 deg for improved stiffness, mobility, and performance of stairs. Baseline: 07/03/2021 INITIAL  2 Pt will ambulate extended community distance without significant pain or difficulty.  Baseline: 07/03/2021 INITIAL  3 Pt will ascend and descend stairs with a reciprocal gait with 1 rail without significant pain.  Baseline: 07/03/2021 INITIAL  4 Pt will demo improved L hip flexion strength to 5/5, hip abd strength to 4/5 MMT, and knee flex/ext strength to 5/5 MMT for improved tolerance to functional mobility and daily activities.  Baseline: 07/03/2021 INITIAL  5 Pt will be able to perform her normal daily transfers without significant difficulty.  Baseline: 07/03/2021 INITIAL  6 Pt will ambulate with a normalized heel to toe gait pattern without limping.  Baseline: 06/20/2021 INITIAL  7 Pt will be able to perform her ADLs and IADLs without significant lumbar pain or  07/03/2021 INITIAL    PLAN: PT FREQUENCY:  3x/wk x 3 weeks, 2-3x/wk x 1 week, and 2x/wk x 4 weeks   PT DURATION: 8 weeks   PLANNED INTERVENTIONS: Therapeutic exercises, Therapeutic activity, Neuro Muscular re-education, Balance training, Gait training, Patient/Family education,  Joint mobilization, Stair training, Electrical stimulation, Spinal mobilization, Cryotherapy, Moist heat, scar mobilization, Taping, and Manual therapy   PLAN FOR NEXT SESSION:  Cont per TKA protocol.  Cont with quad activation, ROM, MT, and gait training.  Ice to reduce pain and soreness        Audie Clear III PT, DPT 05/10/21 2:22 PM

## 2021-05-15 ENCOUNTER — Other Ambulatory Visit (HOSPITAL_COMMUNITY): Payer: Self-pay

## 2021-05-15 ENCOUNTER — Other Ambulatory Visit: Payer: Self-pay

## 2021-05-15 ENCOUNTER — Encounter (HOSPITAL_BASED_OUTPATIENT_CLINIC_OR_DEPARTMENT_OTHER): Payer: Self-pay | Admitting: Physical Therapy

## 2021-05-15 ENCOUNTER — Ambulatory Visit (HOSPITAL_BASED_OUTPATIENT_CLINIC_OR_DEPARTMENT_OTHER): Payer: 59 | Admitting: Physical Therapy

## 2021-05-15 DIAGNOSIS — G8929 Other chronic pain: Secondary | ICD-10-CM | POA: Diagnosis not present

## 2021-05-15 DIAGNOSIS — M25662 Stiffness of left knee, not elsewhere classified: Secondary | ICD-10-CM

## 2021-05-15 DIAGNOSIS — M6281 Muscle weakness (generalized): Secondary | ICD-10-CM | POA: Diagnosis not present

## 2021-05-15 DIAGNOSIS — M545 Low back pain, unspecified: Secondary | ICD-10-CM | POA: Diagnosis not present

## 2021-05-15 DIAGNOSIS — M1712 Unilateral primary osteoarthritis, left knee: Secondary | ICD-10-CM | POA: Diagnosis not present

## 2021-05-15 DIAGNOSIS — M25562 Pain in left knee: Secondary | ICD-10-CM | POA: Diagnosis not present

## 2021-05-15 DIAGNOSIS — M79605 Pain in left leg: Secondary | ICD-10-CM | POA: Diagnosis not present

## 2021-05-15 DIAGNOSIS — R262 Difficulty in walking, not elsewhere classified: Secondary | ICD-10-CM | POA: Diagnosis not present

## 2021-05-15 DIAGNOSIS — M4150 Other secondary scoliosis, site unspecified: Secondary | ICD-10-CM | POA: Diagnosis not present

## 2021-05-15 NOTE — Therapy (Signed)
OUTPATIENT PHYSICAL THERAPY TREATMENT NOTE   Patient Name: [name] MRN: [id number] DOB:03-23-1966, 55 y.o., female Today's Date: 05/15/2021  PCP: Mayra Neer, MD REFERRING PROVIDER: Mayra Neer, MD   PT End of Session - 05/15/21 0804     Visit Number 3    Number of Visits 20    Date for PT Re-Evaluation 07/03/21    PT Start Time 0802    PT Stop Time 0845    PT Time Calculation (min) 43 min             Past Medical History:  Diagnosis Date   Acute medial meniscus tear of left knee 08/21/2020   Allergic rhinitis due to pollen 08/21/2020   Benign hypertension 08/21/2020   Genital warts 08/21/2020   Hematuria 08/21/2020   History of kidney stones    Major depression 08/21/2020   Mixed hyperlipidemia 08/21/2020   Obesity 08/21/2020   Pre-diabetes    Primary localized osteoarthritis of left knee 04/18/2021   Primary localized osteoarthritis of left knee    Radicular syndrome of left leg 04/23/2021   Scoliosis of lumbar region due to degenerative disease of spine in adult 04/23/2021   Sleep apnea    Somnolence 08/21/2020   Tobacco abuse 08/21/2020   Past Surgical History:  Procedure Laterality Date   ANKLE FRACTURE SURGERY     CESAREAN SECTION     FRACTURE SURGERY     HAND SURGERY     KNEE ARTHROSCOPY WITH MEDIAL MENISECTOMY Left 09/04/2020   Procedure: KNEE ARTHROSCOPY WITH MEDIAL MENISECTOMY;  Surgeon: Elsie Saas, MD;  Location: [facility];  Service: Orthopedics;  Laterality: Left;   ROTATOR CUFF REPAIR     TOTAL KNEE ARTHROPLASTY Left 05/06/2021   Procedure: TOTAL KNEE ARTHROPLASTY;  Surgeon: Elsie Saas, MD;  Location: [facility];  Service: Orthopedics;  Laterality: Left;   TUBAL LIGATION     Patient Active Problem List   Diagnosis Date Noted   S/P total knee arthroplasty, left 05/06/2021   Radicular syndrome of left leg 04/23/2021   Scoliosis of lumbar region due to degenerative disease of spine in adult 04/23/2021   Primary  localized osteoarthritis of left knee 04/18/2021   Allergic rhinitis due to pollen 08/21/2020   Benign hypertension 08/21/2020   Genital warts 08/21/2020   Hematuria 08/21/2020   Major depression 08/21/2020   Mixed hyperlipidemia 08/21/2020   Obesity 08/21/2020   Somnolence 08/21/2020   Tobacco abuse 08/21/2020   Acute medial meniscus tear of left knee 08/21/2020   REFERRING PROVIDER: Matthew Saras, PA*; Surgeon:  Dr. Marquette Saa DIAG: M17.12 (ICD-10-CM) - Primary localized osteoarthrosis of the knee, left  M54.50,M79.605 (ICD-10-CM) - Low back pain radiating to left leg  M41.50 (ICD-10-CM) - Scoliosis due to degenerative disease of spine in adult patient    THERAPY DIAG:  Pain in L knee Joint stiffness in L knee Muscle weakness Difficulty in walking Presence of artificial knee replacement   ONSET DATE: 05/06/2021 L TKA   SUBJECTIVE:    SUBJECTIVE STATEMENT: -Patient reports her knee is stiff thsi morning. She reports it I ussualy stiff. She was able to sleep better on her side last night   PERTINENT HISTORY: L3 disc bulge and scoliosis.  R RCR, L ankle surgery.   PAIN:  Are you having pain? Yes NRPS scale:  4/10 currently Pain location: L knee Aggravating factors: standing, walking, and the morning Relieving factors: meds, ice   PRECAUTIONS: Other: Per TKA protocol.  Recent back  issues   WEIGHT BEARING RESTRICTIONS WBAT     PATIENT GOALS to be able to go for a nice long walk     OBJECTIVE:        OBSERVATION: Pt has aquacell dressing over incision.  A couple of small spots of drainage at proximal bandage, nothing abnormal.  Pt has decreased redness/pink along medial mid aquacell.  Pt has bruising in medial knee lower leg anteriorly and anteriorlaterally.         L knee AROM: 3 - 95   L LE Strength:                Pt has fair (-) quad set.  Pt unable to perform supine SLR able to perfrom a quad set today            TODAY'S  TREATMENT: Therapeutic Exercise: -Reviewed pain level, response to prior Rx, and HEP compliance.  -Observed strength and assessed ROM  -Pt performed: -quad sets with 5 sec hold x 15 reps  -Ankle pumps x 20 reps.   -supine hip abd 2x10 reps   -supine heel slides 2x10 reps -seated knee extension stretch with heel propped on stool x 2 mins -Pt received supine L knee extension PROM.  Pt received L knee flexion PROM seated at EOT.  11/9  Therapeutic Exercise: -Reviewed pain level, response to prior Rx, and HEP compliance.  -Observed strength and assessed ROM  -Pt performed: -quad sets with 5 sec hold 2x 15 reps  -Ankle pumps x 20 reps.   -supine hip abd 2x10 reps   -supine heel slides 2x10 reps - Nu-step: 5 min  - SAQ 3x10  - Standing weight shift 2x20  - seated hip abduction with band ( given as an alternative to supine hip abduction because she is having trouble with it)     Manual: PROM into flexion and extension      PATIENT EDUCATION:  Education details:  Reviewed and Updated HEP. Reviewed stretching program; reviewed symptom mangement  Person educated: Patent  Education method: Explanation, Demonstration, Tactile cues, Verbal cues, and Handouts Education comprehension: verbalized understanding, returned demonstration, verbal cues required, and tactile cues required     HOME EXERCISE PROGRAM: Access Code: [email or web address] URL: [email or web address] Date: 05/10/2021 Prepared by: Aaron Edelman   Exercises Supine Quadricep Sets - 2-3 x daily - 7 x weekly - 2 sets - 10 reps - 5 seconds hold ANKLE PUMPS - 1 x daily - 7 x weekly - 3 sets - 10 reps Supine Hip Abduction AROM - 2 x daily - 7 x weekly - 2 sets - 10 reps Seated Knee Extension Stretch with Chair - 2-3 x daily - 7 x weekly - 1 sets - 2-3 reps - 2 minutes hold Supine Heel Slide - 2-3 x daily - 7 x weekly - 1 sets - 10 reps     ASSESSMENT:   CLINICAL IMPRESSION: Patient had a significant improvement in  range today. She had an increase to 95 degrees's with only mild pain. She tolerated there-ex well. Therapy added standing weight shifting in the walker. She was able to perform a SAQ today which she was not able to last time. Next visit we will add an assisted SLR if she can tolerate.     Objective impairments include Abnormal gait, decreased activity tolerance, decreased balance, decreased endurance, decreased mobility, difficulty walking, decreased ROM, decreased strength, hypomobility, increased edema, impaired flexibility, and pain. These impairments are limiting patient from cleaning, community activity, driving, meal  prep, occupation, laundry, and shopping. Personal factors including  current back pain  are also affecting patient's functional outcome.       GOALS:     SHORT TERM GOALS:   STG Name Target Date Goal status  1 Pt will be independent and compliant with HEP for improved ROM, pain, strength, and function. Baseline:  05/29/2021 INITIAL  2 Pt will demo a good quad set and perform supine SLR with no > than minimal quad lag for improved strength and stability with gait.  Baseline:  11/23/20222 INITIAL  3 Pt will demo improved L knee AROM/PROM to 0 - 90/95 deg for improved mobility and stiffness Baseline: 06/05/2021 INITIAL  4 Pt will demo improved TKE, toe off, and Wb'ing thru L LE with gait. Baseline: 05/29/2021 INITIAL  5 Pt will ambulate in community with SPC with good stability without significant pain Baseline: 05/29/2021 INITIAL                      LONG TERM GOALS:    LTG Name Target Date Goal status  1 Pt will demo L knee AROM to be 0 - 115 deg for improved stiffness, mobility, and performance of stairs. Baseline: 07/03/2021 INITIAL  2 Pt will ambulate extended community distance without significant pain or difficulty.  Baseline: 07/03/2021 INITIAL  3 Pt will ascend and descend stairs with a reciprocal gait with 1 rail without significant pain.  Baseline:  07/03/2021 INITIAL  4 Pt will demo improved L hip flexion strength to 5/5, hip abd strength to 4/5 MMT, and knee flex/ext strength to 5/5 MMT for improved tolerance to functional mobility and daily activities.  Baseline: 07/03/2021 INITIAL  5 Pt will be able to perform her normal daily transfers without significant difficulty.  Baseline: 07/03/2021 INITIAL  6 Pt will ambulate with a normalized heel to toe gait pattern without limping.  Baseline: 06/20/2021 INITIAL  7 Pt will be able to perform her ADLs and IADLs without significant lumbar pain or  07/03/2021 INITIAL    PLAN: PT FREQUENCY:  3x/wk x 3 weeks, 2-3x/wk x 1 week, and 2x/wk x 4 weeks   PT DURATION: 8 weeks   PLANNED INTERVENTIONS: Therapeutic exercises, Therapeutic activity, Neuro Muscular re-education, Balance training, Gait training, Patient/Family education, Joint mobilization, Stair training, Electrical stimulation, Spinal mobilization, Cryotherapy, Moist heat, scar mobilization, Taping, and Manual therapy   PLAN FOR NEXT SESSION: Cont per TKA protocol.  Cont with quad activation, ROM, MT, and gait training.  Ice to reduce pain and soreness       Dessie Coma PT DPT  05/15/2021, 8:05 AM

## 2021-05-17 ENCOUNTER — Encounter (HOSPITAL_BASED_OUTPATIENT_CLINIC_OR_DEPARTMENT_OTHER): Payer: Self-pay | Admitting: Physical Therapy

## 2021-05-17 ENCOUNTER — Ambulatory Visit (HOSPITAL_BASED_OUTPATIENT_CLINIC_OR_DEPARTMENT_OTHER): Payer: 59 | Admitting: Physical Therapy

## 2021-05-17 ENCOUNTER — Other Ambulatory Visit: Payer: Self-pay

## 2021-05-17 DIAGNOSIS — M25562 Pain in left knee: Secondary | ICD-10-CM | POA: Diagnosis not present

## 2021-05-17 DIAGNOSIS — M6281 Muscle weakness (generalized): Secondary | ICD-10-CM | POA: Diagnosis not present

## 2021-05-17 DIAGNOSIS — M25662 Stiffness of left knee, not elsewhere classified: Secondary | ICD-10-CM | POA: Diagnosis not present

## 2021-05-17 DIAGNOSIS — M4150 Other secondary scoliosis, site unspecified: Secondary | ICD-10-CM | POA: Diagnosis not present

## 2021-05-17 DIAGNOSIS — R262 Difficulty in walking, not elsewhere classified: Secondary | ICD-10-CM | POA: Diagnosis not present

## 2021-05-17 DIAGNOSIS — G8929 Other chronic pain: Secondary | ICD-10-CM | POA: Diagnosis not present

## 2021-05-17 DIAGNOSIS — M79605 Pain in left leg: Secondary | ICD-10-CM | POA: Diagnosis not present

## 2021-05-17 DIAGNOSIS — M1712 Unilateral primary osteoarthritis, left knee: Secondary | ICD-10-CM | POA: Diagnosis not present

## 2021-05-17 DIAGNOSIS — M545 Low back pain, unspecified: Secondary | ICD-10-CM | POA: Diagnosis not present

## 2021-05-17 NOTE — Therapy (Signed)
OUTPATIENT PHYSICAL THERAPY TREATMENT NOTE   Patient Name: [name] MRN: [id number] DOB:April 21, 1966, 55 y.o., female Today's Date: 05/17/2021  PCP: Mayra Neer, MD REFERRING PROVIDER: Mayra Neer, MD   PT End of Session - 05/17/21 1144     Visit Number 4    Number of Visits 20    Date for PT Re-Evaluation 07/03/21    PT Start Time 0845    PT Stop Time 0925    PT Time Calculation (min) 40 min    Activity Tolerance Patient tolerated treatment well    Behavior During Therapy [facility] for tasks assessed/performed              Past Medical History:  Diagnosis Date   Acute medial meniscus tear of left knee 08/21/2020   Allergic rhinitis due to pollen 08/21/2020   Benign hypertension 08/21/2020   Genital warts 08/21/2020   Hematuria 08/21/2020   History of kidney stones    Major depression 08/21/2020   Mixed hyperlipidemia 08/21/2020   Obesity 08/21/2020   Pre-diabetes    Primary localized osteoarthritis of left knee 04/18/2021   Primary localized osteoarthritis of left knee    Radicular syndrome of left leg 04/23/2021   Scoliosis of lumbar region due to degenerative disease of spine in adult 04/23/2021   Sleep apnea    Somnolence 08/21/2020   Tobacco abuse 08/21/2020   Past Surgical History:  Procedure Laterality Date   ANKLE FRACTURE SURGERY     CESAREAN SECTION     FRACTURE SURGERY     HAND SURGERY     KNEE ARTHROSCOPY WITH MEDIAL MENISECTOMY Left 09/04/2020   Procedure: KNEE ARTHROSCOPY WITH MEDIAL MENISECTOMY;  Surgeon: Elsie Saas, MD;  Location: [facility];  Service: Orthopedics;  Laterality: Left;   ROTATOR CUFF REPAIR     TOTAL KNEE ARTHROPLASTY Left 05/06/2021   Procedure: TOTAL KNEE ARTHROPLASTY;  Surgeon: Elsie Saas, MD;  Location: [facility];  Service: Orthopedics;  Laterality: Left;   TUBAL LIGATION     Patient Active Problem List   Diagnosis Date Noted   S/P total knee arthroplasty, left 05/06/2021   Radicular syndrome of  left leg 04/23/2021   Scoliosis of lumbar region due to degenerative disease of spine in adult 04/23/2021   Primary localized osteoarthritis of left knee 04/18/2021   Allergic rhinitis due to pollen 08/21/2020   Benign hypertension 08/21/2020   Genital warts 08/21/2020   Hematuria 08/21/2020   Major depression 08/21/2020   Mixed hyperlipidemia 08/21/2020   Obesity 08/21/2020   Somnolence 08/21/2020   Tobacco abuse 08/21/2020   Acute medial meniscus tear of left knee 08/21/2020   REFERRING PROVIDER: Matthew Saras, PA*; Surgeon:  Dr. Marquette Saa DIAG: M17.12 (ICD-10-CM) - Primary localized osteoarthrosis of the knee, left  M54.50,M79.605 (ICD-10-CM) - Low back pain radiating to left leg  M41.50 (ICD-10-CM) - Scoliosis due to degenerative disease of spine in adult patient    THERAPY DIAG:  Pain in L knee Joint stiffness in L knee Muscle weakness Difficulty in walking Presence of artificial knee replacement   ONSET DATE: 05/06/2021 L TKA   SUBJECTIVE:    SUBJECTIVE STATEMENT: Pt states her knee is a little sore/swollen. She states she went to to MD and had the bandage removed. She is no longer using CPM or RW. She is using cane now. Her leg is a little more sore today after being out with friends.  PERTINENT HISTORY: L3 disc bulge and scoliosis.  R RCR, L ankle  surgery.   PAIN:  Are you having pain? Yes NRPS scale:  5/10 currently Pain location: L knee Aggravating factors: standing, walking, and the morning Relieving factors: meds, ice   PRECAUTIONS: Other: Per TKA protocol.  Recent back issues   WEIGHT BEARING RESTRICTIONS WBAT     PATIENT GOALS: to be able to go for a nice long walk     OBJECTIVE:        OBSERVATION:  Incision site clean, dry, no drainage, no redness, compression hose in place     L knee AROM: -3 ext 98 flexion        TODAY'S TREATMENT: 11/11  -Reviewed pain level, response to prior Rx, and HEP compliance.  -Observed  strength and assessed ROM  -Pt performed: -SAQ on pink ball with 5 sec hold x 15 reps  -clinician assisted SLR 10x  (very light assist) -supine hip abd 2x10 reps   -supine heel slides with strap 2x10 reps -supine prop quad set on pink ball 3s 10x -seated tailgate flexion stretch 5s 20x -stair and level ground training with and without cane- cuing given for sequencing and safety  -Pt received supine L knee extension PROM.     11/9  Therapeutic Exercise: -Reviewed pain level, response to prior Rx, and HEP compliance.  -Observed strength and assessed ROM  -Pt performed: -quad sets with 5 sec hold 2x 15 reps  -Ankle pumps x 20 reps.   -supine hip abd 2x10 reps   -supine heel slides 2x10 reps - Nu-step: 5 min  - SAQ 3x10  - Standing weight shift 2x20  - seated hip abduction with band ( given as an alternative to supine hip abduction because she is having trouble with it)     Manual: PROM into flexion and extension      PATIENT EDUCATION:  Education details:  Reviewed and Updated HEP. Reviewed stretching program; reviewed symptom mangement  Person educated: Patent  Education method: Explanation, Demonstration, Tactile cues, Verbal cues, and Handouts Education comprehension: verbalized understanding, returned demonstration, verbal cues required, and tactile cues required     HOME EXERCISE PROGRAM: Access Code: [email or web address] URL: [email or web address] Date: 05/10/2021 Prepared by: Aaron Edelman   Exercises Supine Quadricep Sets - 2-3 x daily - 7 x weekly - 2 sets - 10 reps - 5 seconds hold ANKLE PUMPS - 1 x daily - 7 x weekly - 3 sets - 10 reps Supine Hip Abduction AROM - 2 x daily - 7 x weekly - 2 sets - 10 reps Seated Knee Extension Stretch with Chair - 2-3 x daily - 7 x weekly - 1 sets - 2-3 reps - 2 minutes hold Supine Heel Slide - 2-3 x daily - 7 x weekly - 1 sets - 10 reps     ASSESSMENT:   CLINICAL IMPRESSION: Pt presents today with SPC and removed  island dressing. Pt able to tolerate therapy well today with progression of quad strengthening exercse, gait training and ROM. Pt able to reach up to 98 deg of flexion with self OP. Pt required edu and cuing for [facility] on stairs but was able to demonstrate safe and effective gait on level surfaces without AD. Pt advised to walk indoors on level surfaces and to keep SPC for outdoor or uneven ground walking. Pt progressing very well. Revisit SLR at next session and add in standing TKE with band.     Objective impairments include Abnormal gait, decreased activity tolerance, decreased balance, decreased endurance, decreased mobility, difficulty walking, decreased ROM,  decreased strength, hypomobility, increased edema, impaired flexibility, and pain. These impairments are limiting patient from cleaning, community activity, driving, meal prep, occupation, laundry, and shopping. Personal factors including  current back pain  are also affecting patient's functional outcome.       GOALS:     SHORT TERM GOALS:   STG Name Target Date Goal status  1 Pt will be independent and compliant with HEP for improved ROM, pain, strength, and function. Baseline:  05/29/2021 INITIAL  2 Pt will demo a good quad set and perform supine SLR with no > than minimal quad lag for improved strength and stability with gait.  Baseline:  11/23/20222 INITIAL  3 Pt will demo improved L knee AROM/PROM to 0 - 90/95 deg for improved mobility and stiffness Baseline: 06/05/2021 INITIAL  4 Pt will demo improved TKE, toe off, and Wb'ing thru L LE with gait. Baseline: 05/29/2021 INITIAL  5 Pt will ambulate in community with [place] with good stability without significant pain Baseline: 05/29/2021 INITIAL                      LONG TERM GOALS:    LTG Name Target Date Goal status  1 Pt will demo L knee AROM to be 0 - 115 deg for improved stiffness, mobility, and performance of stairs. Baseline: 07/03/2021 INITIAL  2 Pt will ambulate  extended community distance without significant pain or difficulty.  Baseline: 07/03/2021 INITIAL  3 Pt will ascend and descend stairs with a reciprocal gait with 1 rail without significant pain.  Baseline: 07/03/2021 INITIAL  4 Pt will demo improved L hip flexion strength to 5/5, hip abd strength to 4/5 MMT, and knee flex/ext strength to 5/5 MMT for improved tolerance to functional mobility and daily activities.  Baseline: 07/03/2021 INITIAL  5 Pt will be able to perform her normal daily transfers without significant difficulty.  Baseline: 07/03/2021 INITIAL  6 Pt will ambulate with a normalized heel to toe gait pattern without limping.  Baseline: 06/20/2021 INITIAL  7 Pt will be able to perform her ADLs and IADLs without significant lumbar pain or  07/03/2021 INITIAL    PLAN: PT FREQUENCY:  3x/wk x 3 weeks, 2-3x/wk x 1 week, and 2x/wk x 4 weeks   PT DURATION: 8 weeks   PLANNED INTERVENTIONS: Therapeutic exercises, Therapeutic activity, Neuro Muscular re-education, Balance training, Gait training, Patient/Family education, Joint mobilization, Stair training, Electrical stimulation, Spinal mobilization, Cryotherapy, Moist heat, scar mobilization, Taping, and Manual therapy   PLAN FOR NEXT SESSION: Cont per TKA protocol.  Cont with quad activation, ROM, MT, and gait training.  Ice to reduce pain and soreness       Daleen Bo PT DPT  05/17/2021, 11:45 AM

## 2021-05-20 ENCOUNTER — Other Ambulatory Visit: Payer: Self-pay

## 2021-05-20 ENCOUNTER — Ambulatory Visit (HOSPITAL_BASED_OUTPATIENT_CLINIC_OR_DEPARTMENT_OTHER): Payer: 59 | Admitting: Physical Therapy

## 2021-05-20 ENCOUNTER — Encounter (HOSPITAL_BASED_OUTPATIENT_CLINIC_OR_DEPARTMENT_OTHER): Payer: Self-pay | Admitting: Physical Therapy

## 2021-05-20 DIAGNOSIS — M79605 Pain in left leg: Secondary | ICD-10-CM | POA: Diagnosis not present

## 2021-05-20 DIAGNOSIS — M6281 Muscle weakness (generalized): Secondary | ICD-10-CM

## 2021-05-20 DIAGNOSIS — M25662 Stiffness of left knee, not elsewhere classified: Secondary | ICD-10-CM | POA: Diagnosis not present

## 2021-05-20 DIAGNOSIS — M4150 Other secondary scoliosis, site unspecified: Secondary | ICD-10-CM | POA: Diagnosis not present

## 2021-05-20 DIAGNOSIS — M25562 Pain in left knee: Secondary | ICD-10-CM | POA: Diagnosis not present

## 2021-05-20 DIAGNOSIS — M1712 Unilateral primary osteoarthritis, left knee: Secondary | ICD-10-CM | POA: Diagnosis not present

## 2021-05-20 DIAGNOSIS — M545 Low back pain, unspecified: Secondary | ICD-10-CM | POA: Diagnosis not present

## 2021-05-20 DIAGNOSIS — G8929 Other chronic pain: Secondary | ICD-10-CM | POA: Diagnosis not present

## 2021-05-20 DIAGNOSIS — R262 Difficulty in walking, not elsewhere classified: Secondary | ICD-10-CM

## 2021-05-20 NOTE — Therapy (Addendum)
OUTPATIENT PHYSICAL THERAPY TREATMENT NOTE   Patient Name: [name] MRN: [id number] DOB:[birth year], 55 y.o., female Today's Date: 05/20/2021  PCP: Mayra Neer, MD  PT End of Session - 05/20/21 1423     Visit Number 5    Number of Visits 20    Date for PT Re-Evaluation 07/03/21    Progress Note Due on Visit --   06/07/21   PT Start Time 1350    PT Stop Time 1438    PT Time Calculation (min) 48 min    Activity Tolerance Patient tolerated treatment well    Behavior During Therapy [facility] for tasks assessed/performed               Past Medical History:  Diagnosis Date   Acute medial meniscus tear of left knee 08/21/2020   Allergic rhinitis due to pollen 08/21/2020   Benign hypertension 08/21/2020   Genital warts 08/21/2020   Hematuria 08/21/2020   History of kidney stones    Major depression 08/21/2020   Mixed hyperlipidemia 08/21/2020   Obesity 08/21/2020   Pre-diabetes    Primary localized osteoarthritis of left knee 04/18/2021   Primary localized osteoarthritis of left knee    Radicular syndrome of left leg 04/23/2021   Scoliosis of lumbar region due to degenerative disease of spine in adult 04/23/2021   Sleep apnea    Somnolence 08/21/2020   Tobacco abuse 08/21/2020   Past Surgical History:  Procedure Laterality Date   ANKLE FRACTURE SURGERY     CESAREAN SECTION     FRACTURE SURGERY     HAND SURGERY     KNEE ARTHROSCOPY WITH MEDIAL MENISECTOMY Left 09/04/2020   Procedure: KNEE ARTHROSCOPY WITH MEDIAL MENISECTOMY;  Surgeon: Elsie Saas, MD;  Location: [facility];  Service: Orthopedics;  Laterality: Left;   ROTATOR CUFF REPAIR     TOTAL KNEE ARTHROPLASTY Left 05/06/2021   Procedure: TOTAL KNEE ARTHROPLASTY;  Surgeon: Elsie Saas, MD;  Location: [facility];  Service: Orthopedics;  Laterality: Left;   TUBAL LIGATION     Patient Active Problem List   Diagnosis Date Noted   S/P total knee arthroplasty, left 05/06/2021   Radicular syndrome  of left leg 04/23/2021   Scoliosis of lumbar region due to degenerative disease of spine in adult 04/23/2021   Primary localized osteoarthritis of left knee 04/18/2021   Allergic rhinitis due to pollen 08/21/2020   Benign hypertension 08/21/2020   Genital warts 08/21/2020   Hematuria 08/21/2020   Major depression 08/21/2020   Mixed hyperlipidemia 08/21/2020   Obesity 08/21/2020   Somnolence 08/21/2020   Tobacco abuse 08/21/2020   Acute medial meniscus tear of left knee 08/21/2020   REFERRING PROVIDER: Matthew Saras, PA*; Surgeon:  Dr. Noemi Chapel   REFERRING DIAG: M17.12 (ICD-10-CM) - Primary localized osteoarthrosis of the knee, left  M54.50,M79.605 (ICD-10-CM) - Low back pain radiating to left leg  M41.50 (ICD-10-CM) - Scoliosis due to degenerative disease of spine in adult patient    THERAPY DIAG:  Pain in L knee Joint stiffness in L knee Muscle weakness Difficulty in walking Presence of artificial knee replacement   ONSET DATE: 05/06/2021 L TKA   SUBJECTIVE:    SUBJECTIVE STATEMENT: Pt is 2 weeks s/p L TKA.  Pt saw PA last Wednesday who informed her to stop using the walker and cont with PT.  Pt was also told she could stop using the CPM.  She has only used it once recently.  Pt stopped using the cane on Friday.  Pt denies any adverse effects after prior Rx.  Pt states the ESI is wearing off and she is feeling more pain in her in her back.  Pt ambulated in the grocery store pushing the cart without adverse effects.  Pt reports improved mobility including being able to prop foot up and dry leg when coming out of the shower.      PERTINENT HISTORY: L3 disc bulge and scoliosis.  R RCR, L ankle surgery.   PAIN:  Are you having pain? Yes NRPS scale:  2/10 / 3-4/10 currently Pain location: L knee/lumbar Aggravating factors: standing, walking, and the morning Relieving factors: meds, ice   PRECAUTIONS: Other: Per TKA protocol.  Recent back issues   WEIGHT BEARING  RESTRICTIONS WBAT     PATIENT GOALS: to be able to go for a nice long walk     OBJECTIVE:        OBSERVATION:  Incision site clean, dry, no drainage, and intact.  Pt not wearing compression hose today though is normally wearing it.  Pt has bruising located proximal to incision and more significantly distal to the incision.       L knee AROM: -4 ext 105 flexion      TODAY'S TREATMENT: -Reviewed pain level, response to prior Rx, and HEP compliance.  -Assessed ROM  -Pt performed: -sci fit bike x 5 mins -SAQ on pink ball 2x15 reps -clinician assisted SLR 2x10 with very light assist -supine heel slides AROM and with strap AAROM x10 reps each -supine quad set with heel prop on half roll with 5 sec 10x -standing heel raises 2x10 reps -step ups on 4 inch step x 10 reps with 1 UE assist -standing hip abduction (L LE) 2 x 10 reps    -Manual Therapy: -Grade I-II 4D patellar mobs to improve pain, ROM, and to normalize arthrokinematics -L knee flexion PROM seated at EOT per jt stiffness and pt tolerance -supine manual knee extension stretch with heel propped in PT's hand.         PATIENT EDUCATION:  Education details:  objective findings, HEP, Exercise form, and POC.  Person educated: Clinical research associate: Explanation, Demonstration, Tactile cues, and Verbal cues Education comprehension: verbalized understanding, returned demonstration, verbal cues required, and tactile cues required     HOME EXERCISE PROGRAM: Access Code: [email or web address] URL: [email or web address] Date: 05/20/2021 Prepared by: Aaron Edelman  Exercises Supine Quadricep Sets - 2-3 x daily - 7 x weekly - 2 sets - 10 reps - 5 seconds hold ANKLE PUMPS - 1 x daily - 7 x weekly - 3 sets - 10 reps Supine Hip Abduction AROM - 2 x daily - 7 x weekly - 2 sets - 10 reps Seated Knee Extension Stretch with Chair - 2-3 x daily - 7 x weekly - 1 sets - 2-3 reps - 2 minutes hold Supine Heel Slide - 2-3 x  daily - 7 x weekly - 1 sets - 10 reps Standing Anterior Posterior Weight Shift with Chair - 1 x daily - 7 x weekly - 3 sets - 10 reps Supine Knee Extension Strengthening - 1 x daily - 7 x weekly - 3 sets - 10 reps Supine Heel Slide with Strap - 2 x daily - 7 x weekly - 1-2 sets - 10 reps - 3-5 seconds hold      ASSESSMENT:   CLINICAL IMPRESSION: Pt presents to Rx without an AD.  Pt reports she is doing well not using an AD with ambulation.  She did push the cart in the grocery store.  Pt performed exercises well with cuing for positioning and form.  Pt is making good progress with flexion ROM.  She continues to have tightness and limitations with extension ROM though is improving overall.  Pt is progressing well with protocol.  She responded well to Rx having no increased pain after Rx.  She should benefit from cont skilled PT services per protocol to address goals, address impairments, and to assist in restoring desired level of function.       Objective impairments include Abnormal gait, decreased activity tolerance, decreased balance, decreased endurance, decreased mobility, difficulty walking, decreased ROM, decreased strength, hypomobility, increased edema, impaired flexibility, and pain. These impairments are limiting patient from cleaning, community activity, driving, meal prep, occupation, laundry, and shopping. Personal factors including  current back pain  are also affecting patient's functional outcome.       GOALS:     SHORT TERM GOALS:   STG Name Target Date Goal status  1 Pt will be independent and compliant with HEP for improved ROM, pain, strength, and function. Baseline:  05/29/2021 INITIAL  2 Pt will demo a good quad set and perform supine SLR with no > than minimal quad lag for improved strength and stability with gait.  Baseline:  11/23/20222 INITIAL  3 Pt will demo improved L knee AROM/PROM to 0 - 90/95 deg for improved mobility and stiffness Baseline: 06/05/2021  INITIAL  4 Pt will demo improved TKE, toe off, and Wb'ing thru L LE with gait. Baseline: 05/29/2021 INITIAL  5 Pt will ambulate in community with [place] with good stability without significant pain Baseline: 05/29/2021 INITIAL                      LONG TERM GOALS:    LTG Name Target Date Goal status  1 Pt will demo L knee AROM to be 0 - 115 deg for improved stiffness, mobility, and performance of stairs. Baseline: 07/03/2021 INITIAL  2 Pt will ambulate extended community distance without significant pain or difficulty.  Baseline: 07/03/2021 INITIAL  3 Pt will ascend and descend stairs with a reciprocal gait with 1 rail without significant pain.  Baseline: 07/03/2021 INITIAL  4 Pt will demo improved L hip flexion strength to 5/5, hip abd strength to 4/5 MMT, and knee flex/ext strength to 5/5 MMT for improved tolerance to functional mobility and daily activities.  Baseline: 07/03/2021 INITIAL  5 Pt will be able to perform her normal daily transfers without significant difficulty.  Baseline: 07/03/2021 INITIAL  6 Pt will ambulate with a normalized heel to toe gait pattern without limping.  Baseline: 06/20/2021 INITIAL  7 Pt will be able to perform her ADLs and IADLs without significant lumbar pain or  07/03/2021 INITIAL    PLAN: PT FREQUENCY:  3x/wk x 3 weeks, 2-3x/wk x 1 week, and 2x/wk x 4 weeks   PT DURATION: 8 weeks   PLANNED INTERVENTIONS: Therapeutic exercises, Therapeutic activity, Neuro Muscular re-education, Balance training, Gait training, Patient/Family education, Joint mobilization, Stair training, Electrical stimulation, Spinal mobilization, Cryotherapy, Moist heat, scar mobilization, Taping, and Manual therapy   PLAN FOR NEXT SESSION: Cont per TKA protocol.  Cont with quad activation, ROM, MT, and gait training.  Ice to reduce pain and soreness       Selinda Michaels III PT, DPT 05/20/21 5:36 PM

## 2021-05-22 ENCOUNTER — Other Ambulatory Visit: Payer: Self-pay

## 2021-05-22 ENCOUNTER — Encounter (HOSPITAL_BASED_OUTPATIENT_CLINIC_OR_DEPARTMENT_OTHER): Payer: Self-pay | Admitting: Physical Therapy

## 2021-05-22 ENCOUNTER — Ambulatory Visit (HOSPITAL_BASED_OUTPATIENT_CLINIC_OR_DEPARTMENT_OTHER): Payer: 59 | Admitting: Physical Therapy

## 2021-05-22 DIAGNOSIS — M79605 Pain in left leg: Secondary | ICD-10-CM | POA: Diagnosis not present

## 2021-05-22 DIAGNOSIS — R262 Difficulty in walking, not elsewhere classified: Secondary | ICD-10-CM | POA: Diagnosis not present

## 2021-05-22 DIAGNOSIS — M25562 Pain in left knee: Secondary | ICD-10-CM | POA: Diagnosis not present

## 2021-05-22 DIAGNOSIS — M4150 Other secondary scoliosis, site unspecified: Secondary | ICD-10-CM | POA: Diagnosis not present

## 2021-05-22 DIAGNOSIS — M1712 Unilateral primary osteoarthritis, left knee: Secondary | ICD-10-CM | POA: Diagnosis not present

## 2021-05-22 DIAGNOSIS — M25662 Stiffness of left knee, not elsewhere classified: Secondary | ICD-10-CM

## 2021-05-22 DIAGNOSIS — M6281 Muscle weakness (generalized): Secondary | ICD-10-CM

## 2021-05-22 DIAGNOSIS — M545 Low back pain, unspecified: Secondary | ICD-10-CM | POA: Diagnosis not present

## 2021-05-22 DIAGNOSIS — G8929 Other chronic pain: Secondary | ICD-10-CM | POA: Diagnosis not present

## 2021-05-22 NOTE — Therapy (Addendum)
OUTPATIENT PHYSICAL THERAPY TREATMENT NOTE   Patient Name: [name] MRN: [id number] DOB:16-Jan-1966, 55 y.o., female Today's Date: 05/22/2021  PCP: Lupita Raider, MD  PT End of Session - 05/22/21 1541     Visit Number 6    Number of Visits 20    Date for PT Re-Evaluation 07/03/21    Progress Note Due on Visit --   06/07/21   PT Start Time 1526    PT Stop Time 1605    PT Time Calculation (min) 39 min    Activity Tolerance Patient tolerated treatment well    Behavior During Therapy [facility] for tasks assessed/performed                Past Medical History:  Diagnosis Date   Acute medial meniscus tear of left knee 08/21/2020   Allergic rhinitis due to pollen 08/21/2020   Benign hypertension 08/21/2020   Genital warts 08/21/2020   Hematuria 08/21/2020   History of kidney stones    Major depression 08/21/2020   Mixed hyperlipidemia 08/21/2020   Obesity 08/21/2020   Pre-diabetes    Primary localized osteoarthritis of left knee 04/18/2021   Primary localized osteoarthritis of left knee    Radicular syndrome of left leg 04/23/2021   Scoliosis of lumbar region due to degenerative disease of spine in adult 04/23/2021   Sleep apnea    Somnolence 08/21/2020   Tobacco abuse 08/21/2020   Past Surgical History:  Procedure Laterality Date   ANKLE FRACTURE SURGERY     CESAREAN SECTION     FRACTURE SURGERY     HAND SURGERY     KNEE ARTHROSCOPY WITH MEDIAL MENISECTOMY Left 09/04/2020   Procedure: KNEE ARTHROSCOPY WITH MEDIAL MENISECTOMY;  Surgeon: Salvatore Marvel, MD;  Location: [facility];  Service: Orthopedics;  Laterality: Left;   ROTATOR CUFF REPAIR     TOTAL KNEE ARTHROPLASTY Left 05/06/2021   Procedure: TOTAL KNEE ARTHROPLASTY;  Surgeon: Salvatore Marvel, MD;  Location: [facility];  Service: Orthopedics;  Laterality: Left;   TUBAL LIGATION     Patient Active Problem List   Diagnosis Date Noted   S/P total knee arthroplasty, left 05/06/2021   Radicular  syndrome of left leg 04/23/2021   Scoliosis of lumbar region due to degenerative disease of spine in adult 04/23/2021   Primary localized osteoarthritis of left knee 04/18/2021   Allergic rhinitis due to pollen 08/21/2020   Benign hypertension 08/21/2020   Genital warts 08/21/2020   Hematuria 08/21/2020   Major depression 08/21/2020   Mixed hyperlipidemia 08/21/2020   Obesity 08/21/2020   Somnolence 08/21/2020   Tobacco abuse 08/21/2020   Acute medial meniscus tear of left knee 08/21/2020   REFERRING PROVIDER: Julien Girt, PA*; Surgeon:  Dr. Thurston Hole   REFERRING DIAG: M17.12 (ICD-10-CM) - Primary localized osteoarthrosis of the knee, left  M54.50,M79.605 (ICD-10-CM) - Low back pain radiating to left leg  M41.50 (ICD-10-CM) - Scoliosis due to degenerative disease of spine in adult patient    THERAPY DIAG:  Pain in L knee Joint stiffness in L knee Muscle weakness Difficulty in walking Presence of artificial knee replacement   ONSET DATE: 05/06/2021 L TKA   SUBJECTIVE:    SUBJECTIVE STATEMENT: Pt is 2 weeks and 2 days s/p L TKA.  Pt saw PA last Wednesday who informed her to stop using the walker and cont with PT.  Pt was also told she could stop using the CPM.  She has only used it once recently.  Pt stopped using the  cane on Friday.  Pt denies any adverse effects after prior Rx.  Pt states the ESI is wearing off and she is feeling more pain in her in her back.  Pt ambulated in the grocery store pushing the cart without adverse effects.  Pt reports improved mobility including being able to prop foot up and dry leg when coming out of the shower.  Pt has difficulty with donning/doffing shoes and socks.  Pt is able to don compression stockings own.       Pt states her knee feels tight and swollen when she has been on her feet a lot and walking.  Pt reports compliance with HEP and has been using the CPM.   PERTINENT HISTORY: L3 disc bulge and scoliosis.  R RCR, L ankle surgery.    PAIN:  Are you having pain? Yes NRPS scale:  3-4/10 currently Pain location: L knee Aggravating factors: standing, walking, and the morning Relieving factors: meds, ice   PRECAUTIONS: Other: Per TKA protocol.  Recent back issues   WEIGHT BEARING RESTRICTIONS WBAT     PATIENT GOALS: to be able to go for a nice long walk     OBJECTIVE:        OBSERVATION:  Incision site clean, dry, no drainage, and intact.  Pt not wearing compression hose today though is normally wearing it.  Pt has bruising located proximal to incision and more significantly distal to the incision.       L knee AROM: -2 ext 105 flexion      TODAY'S TREATMENT: -Reviewed pain level, response to prior Rx, and HEP compliance.  -Assessed ROM  -Pt performed: -sci fit bike x 5 mins -seated knee flexion AAROM with contralateral LE assist x 10 reps with 5 second hold -LAQ 2x10 reps -supine SLR 2x10  -supine heel slides AAROM with strap x10 reps each -standing heel raises 2x10 reps -step ups on 4 inch step 2 x 10 reps with 1 UE assist -TKE with RTB 2x10 reps    -Manual Therapy: -Grade I-II 4D patellar mobs to improve pain, ROM, and to normalize arthrokinematics -L knee flexion PROM seated at EOT per jt stiffness and pt tolerance -supine manual knee extension stretch with heel propped in PT's hand.         PATIENT EDUCATION:  Education details:  objective findings, HEP, Exercise form, and POC.  Person educated: Special educational needs teacher: Explanation, Demonstration, Tactile cues, and Verbal cues Education comprehension: verbalized understanding, returned demonstration, verbal cues required, and tactile cues required     HOME EXERCISE PROGRAM: Access Code: [email or web address] URL: [email or web address] Date: 05/20/2021 Prepared by: Ronny Flurry  Exercises Supine Quadricep Sets - 2-3 x daily - 7 x weekly - 2 sets - 10 reps - 5 seconds hold ANKLE PUMPS - 1 x daily - 7 x weekly - 3 sets - 10  reps Supine Hip Abduction AROM - 2 x daily - 7 x weekly - 2 sets - 10 reps Seated Knee Extension Stretch with Chair - 2-3 x daily - 7 x weekly - 1 sets - 2-3 reps - 2 minutes hold Supine Heel Slide - 2-3 x daily - 7 x weekly - 1 sets - 10 reps Standing Anterior Posterior Weight Shift with Chair - 1 x daily - 7 x weekly - 3 sets - 10 reps Supine Knee Extension Strengthening - 1 x daily - 7 x weekly - 3 sets - 10 reps Supine Heel Slide with Strap - 2 x daily -  7 x weekly - 1-2 sets - 10 reps - 3-5 seconds hold      ASSESSMENT:   CLINICAL IMPRESSION: Pt is progressing well with mobility, gait, ROM, strength, and function.  Pt is improving with quality of gait and is ambulating increased distance without an AD.  Pt is making good progress with L knee flexion ROM and demonstrates improved L knee extension ROM today.  Pt performed exercises per protocol well with cuing for positioning and form.  Pt is progressing well with protocol.  She responded well to Rx having no increased pain after Rx.  She should benefit from cont skilled PT services per protocol to address goals, address impairments, and to assist in restoring desired level of function.      Objective impairments include Abnormal gait, decreased activity tolerance, decreased balance, decreased endurance, decreased mobility, difficulty walking, decreased ROM, decreased strength, hypomobility, increased edema, impaired flexibility, and pain. These impairments are limiting patient from cleaning, community activity, driving, meal prep, occupation, laundry, and shopping. Personal factors including  current back pain  are also affecting patient's functional outcome.       GOALS:     SHORT TERM GOALS:   STG Name Target Date Goal status  1 Pt will be independent and compliant with HEP for improved ROM, pain, strength, and function. Baseline:  05/29/2021 INITIAL  2 Pt will demo a good quad set and perform supine SLR with no > than minimal quad  lag for improved strength and stability with gait.  Baseline:  11/23/20222 INITIAL  3 Pt will demo improved L knee AROM/PROM to 0 - 90/95 deg for improved mobility and stiffness Baseline: 06/05/2021 INITIAL  4 Pt will demo improved TKE, toe off, and Wb'ing thru L LE with gait. Baseline: 05/29/2021 INITIAL  5 Pt will ambulate in community with [employer] with good stability without significant pain Baseline: 05/29/2021 INITIAL                      LONG TERM GOALS:    LTG Name Target Date Goal status  1 Pt will demo L knee AROM to be 0 - 115 deg for improved stiffness, mobility, and performance of stairs. Baseline: 07/03/2021 INITIAL  2 Pt will ambulate extended community distance without significant pain or difficulty.  Baseline: 07/03/2021 INITIAL  3 Pt will ascend and descend stairs with a reciprocal gait with 1 rail without significant pain.  Baseline: 07/03/2021 INITIAL  4 Pt will demo improved L hip flexion strength to 5/5, hip abd strength to 4/5 MMT, and knee flex/ext strength to 5/5 MMT for improved tolerance to functional mobility and daily activities.  Baseline: 07/03/2021 INITIAL  5 Pt will be able to perform her normal daily transfers without significant difficulty.  Baseline: 07/03/2021 INITIAL  6 Pt will ambulate with a normalized heel to toe gait pattern without limping.  Baseline: 06/20/2021 INITIAL  7 Pt will be able to perform her ADLs and IADLs without significant lumbar pain or  07/03/2021 INITIAL    PLAN: PT FREQUENCY:  3x/wk x 3 weeks, 2-3x/wk x 1 week, and 2x/wk x 4 weeks   PT DURATION: 8 weeks   PLANNED INTERVENTIONS: Therapeutic exercises, Therapeutic activity, Neuro Muscular re-education, Balance training, Gait training, Patient/Family education, Joint mobilization, Stair training, Electrical stimulation, Spinal mobilization, Cryotherapy, Moist heat, scar mobilization, Taping, and Manual therapy   PLAN FOR NEXT SESSION: Cont per TKA protocol.  Cont with quad  activation, ROM, MT, and gait training.  Ice to  reduce pain and soreness       Selinda Michaels III PT, DPT 05/22/21 11:05 PM

## 2021-05-24 ENCOUNTER — Ambulatory Visit (HOSPITAL_BASED_OUTPATIENT_CLINIC_OR_DEPARTMENT_OTHER): Payer: 59 | Admitting: Physical Therapy

## 2021-05-24 ENCOUNTER — Encounter (HOSPITAL_BASED_OUTPATIENT_CLINIC_OR_DEPARTMENT_OTHER): Payer: Self-pay | Admitting: Physical Therapy

## 2021-05-24 ENCOUNTER — Other Ambulatory Visit: Payer: Self-pay

## 2021-05-24 DIAGNOSIS — M6281 Muscle weakness (generalized): Secondary | ICD-10-CM

## 2021-05-24 DIAGNOSIS — M25662 Stiffness of left knee, not elsewhere classified: Secondary | ICD-10-CM

## 2021-05-24 DIAGNOSIS — M25562 Pain in left knee: Secondary | ICD-10-CM

## 2021-05-24 DIAGNOSIS — M4150 Other secondary scoliosis, site unspecified: Secondary | ICD-10-CM | POA: Diagnosis not present

## 2021-05-24 DIAGNOSIS — G8929 Other chronic pain: Secondary | ICD-10-CM | POA: Diagnosis not present

## 2021-05-24 DIAGNOSIS — M1712 Unilateral primary osteoarthritis, left knee: Secondary | ICD-10-CM | POA: Diagnosis not present

## 2021-05-24 DIAGNOSIS — R262 Difficulty in walking, not elsewhere classified: Secondary | ICD-10-CM | POA: Diagnosis not present

## 2021-05-24 DIAGNOSIS — M545 Low back pain, unspecified: Secondary | ICD-10-CM | POA: Diagnosis not present

## 2021-05-24 DIAGNOSIS — M79605 Pain in left leg: Secondary | ICD-10-CM | POA: Diagnosis not present

## 2021-05-24 NOTE — Therapy (Addendum)
OUTPATIENT PHYSICAL THERAPY TREATMENT NOTE   Patient Name: [name] MRN: [id number] DOB:1965/10/03, 55 y.o., female Today's Date: 05/24/2021  PCP: Mayra Neer, MD  PT End of Session - 05/24/21 1238     Visit Number 7    Number of Visits 20    Date for PT Re-Evaluation 07/03/21    Progress Note Due on Visit --   06/07/21   PT Start Time 1236    PT Stop Time [id number]    PT Time Calculation (min) 41 min    Activity Tolerance Patient tolerated treatment well    Behavior During Therapy [facility] for tasks assessed/performed                Past Medical History:  Diagnosis Date   Acute medial meniscus tear of left knee 08/21/2020   Allergic rhinitis due to pollen 08/21/2020   Benign hypertension 08/21/2020   Genital warts 08/21/2020   Hematuria 08/21/2020   History of kidney stones    Major depression 08/21/2020   Mixed hyperlipidemia 08/21/2020   Obesity 08/21/2020   Pre-diabetes    Primary localized osteoarthritis of left knee 04/18/2021   Primary localized osteoarthritis of left knee    Radicular syndrome of left leg 04/23/2021   Scoliosis of lumbar region due to degenerative disease of spine in adult 04/23/2021   Sleep apnea    Somnolence 08/21/2020   Tobacco abuse 08/21/2020   Past Surgical History:  Procedure Laterality Date   ANKLE FRACTURE SURGERY     CESAREAN SECTION     FRACTURE SURGERY     HAND SURGERY     KNEE ARTHROSCOPY WITH MEDIAL MENISECTOMY Left 09/04/2020   Procedure: KNEE ARTHROSCOPY WITH MEDIAL MENISECTOMY;  Surgeon: Elsie Saas, MD;  Location: [facility];  Service: Orthopedics;  Laterality: Left;   ROTATOR CUFF REPAIR     TOTAL KNEE ARTHROPLASTY Left 05/06/2021   Procedure: TOTAL KNEE ARTHROPLASTY;  Surgeon: Elsie Saas, MD;  Location: [facility];  Service: Orthopedics;  Laterality: Left;   TUBAL LIGATION     Patient Active Problem List   Diagnosis Date Noted   S/P total knee arthroplasty, left 05/06/2021   Radicular  syndrome of left leg 04/23/2021   Scoliosis of lumbar region due to degenerative disease of spine in adult 04/23/2021   Primary localized osteoarthritis of left knee 04/18/2021   Allergic rhinitis due to pollen 08/21/2020   Benign hypertension 08/21/2020   Genital warts 08/21/2020   Hematuria 08/21/2020   Major depression 08/21/2020   Mixed hyperlipidemia 08/21/2020   Obesity 08/21/2020   Somnolence 08/21/2020   Tobacco abuse 08/21/2020   Acute medial meniscus tear of left knee 08/21/2020   REFERRING PROVIDER: Matthew Saras, PA*; Surgeon:  Dr. Noemi Chapel   REFERRING DIAG: M17.12 (ICD-10-CM) - Primary localized osteoarthrosis of the knee, left  M54.50,M79.605 (ICD-10-CM) - Low back pain radiating to left leg  M41.50 (ICD-10-CM) - Scoliosis due to degenerative disease of spine in adult patient    THERAPY DIAG:  Pain in L knee Joint stiffness in L knee Muscle weakness Difficulty in walking Presence of artificial knee replacement   ONSET DATE: 05/06/2021 L TKA   SUBJECTIVE:    SUBJECTIVE STATEMENT: Pt is 2 weeks and 4 days s/p L TKA.  Pt saw PA last Wednesday who informed her to stop using the walker and cont with PT.  Pt was also told she could stop using the CPM.  She has not used the CPM since prior to last Rx.  Pt denies any adverse effects after prior Rx.  Pt was able to get on/off the floor.  Pt reports improved mobility including being able to prop foot up and dry leg when coming out of the shower.  Pt has difficulty with donning/doffing shoes and socks.  Pt is able to don compression stockings own.  Pt reports having tightness in knee last night and this AM.  Pt reports compliance with HEP          PERTINENT HISTORY: L3 disc bulge and scoliosis.  R RCR, L ankle surgery.   PAIN:  Are you having pain? Yes NRPS scale:  3-4/10 currently Pain location: L knee Aggravating factors: standing, walking, and the morning Relieving factors: meds, ice   PRECAUTIONS: Other: Per  TKA protocol.  Recent back issues   WEIGHT BEARING RESTRICTIONS WBAT     PATIENT GOALS: to be able to go for a nice long walk     OBJECTIVE:        OBSERVATION:  Incision site clean, dry, no drainage, and intact.  Pt not wearing compression hose today though is normally wearing it.  Pt has bruising located proximal to incision and more significantly distal to the incision.       L knee AROM: -2 ext 108 flexion      TODAY'S TREATMENT: -Reviewed pain level, response to prior Rx, and HEP compliance.  -Assessed ROM  -Pt performed: -sci fit bike x 5 mins on seat 10 -SAQ 2x10 reps -LAQ approx 15 reps -supine SLR 2x10  -supine heel slides AAROM with strap x10 reps each -standing heel raises 2x10 reps -step ups on 4 inch step 2 x 10 reps with 1 UE assist -TKE with RTB 2x10 reps -Gave pt a HEP handout and was educated in correct form and appropriate frequency.     -Manual Therapy: -Grade I-II 4D patellar mobs to improve pain, ROM, and to normalize arthrokinematics -L knee flexion and extension PROM in supine per jt stiffness and pt tolerance -supine manual knee extension stretch with heel propped in PT's hand.         PATIENT EDUCATION:  Education details:  Updated HEP.  Pt received an updated HEP handout and was educated in correct form and appropriate frequency.  objective findings, HEP, Exercise form, and POC.  Person educated: Patent  Education method: Explanation, Demonstration, Tactile cues, and Verbal cues.  Handout.  Education comprehension: verbalized understanding, returned demonstration, verbal cues required, and tactile cues required     HOME EXERCISE PROGRAM: Access Code: [email or web address] URL: [email or web address] Date: 05/24/2021 Prepared by: Ronny Flurry  Exercises Supine Quadricep Sets - 2-3 x daily - 7 x weekly - 2 sets - 10 reps - 5 seconds hold ANKLE PUMPS - 1 x daily - 7 x weekly - 3 sets - 10 reps Supine Hip Abduction AROM - 2 x daily  - 7 x weekly - 2 sets - 10 reps Seated Knee Extension Stretch with Chair - 2-3 x daily - 7 x weekly - 1 sets - 2-3 reps - 2 minutes hold Supine Heel Slide - 2-3 x daily - 7 x weekly - 1 sets - 10 reps Standing Anterior Posterior Weight Shift with Chair - 1 x daily - 7 x weekly - 3 sets - 10 reps Supine Knee Extension Strengthening - 1 x daily - 7 x weekly - 3 sets - 10 reps Supine Heel Slide with Strap - 2 x daily - 7 x weekly - 1-2 sets - 10 reps -  3-5 seconds hold Supine Active Straight Leg Raise - 1 x daily - 7 x weekly - 2 sets - 10 reps Heel Raises with Counter Support - 1 x daily - 6-7 x weekly - 2 sets - 10 reps Standing Terminal Knee Extension with Resistance - 1 x daily - 6-7 x weekly - 2 sets - 10 reps       ASSESSMENT:   CLINICAL IMPRESSION: Pt is progressing well with mobility, gait, ROM, strength, and function.  Pt is making good progress with L knee flexion ROM and demonstrates improved L knee flexion ROM today.  Pt lacks full knee extension though is improving.  Pt performed exercises per protocol well with cuing for positioning and form.  Pt required cuing for correct form with supine SLR including taking her time and working on maintaining extension.  Attempted 1# on SAQ though removed it due to pt having some ant knee pain.   Pt is progressing appropriately per protocol.  She responded well to Rx having no increased pain after Rx.  She should benefit from cont skilled PT services per protocol to address goals, address impairments, and to assist in restoring desired level of function.        Objective impairments include Abnormal gait, decreased activity tolerance, decreased balance, decreased endurance, decreased mobility, difficulty walking, decreased ROM, decreased strength, hypomobility, increased edema, impaired flexibility, and pain. These impairments are limiting patient from cleaning, community activity, driving, meal prep, occupation, laundry, and shopping. Personal  factors including  current back pain  are also affecting patient's functional outcome.       GOALS:     SHORT TERM GOALS:   STG Name Target Date Goal status  1 Pt will be independent and compliant with HEP for improved ROM, pain, strength, and function. Baseline:  05/29/2021 INITIAL  2 Pt will demo a good quad set and perform supine SLR with no > than minimal quad lag for improved strength and stability with gait.  Baseline:  11/23/20222 INITIAL  3 Pt will demo improved L knee AROM/PROM to 0 - 90/95 deg for improved mobility and stiffness Baseline: 06/05/2021 INITIAL  4 Pt will demo improved TKE, toe off, and Wb'ing thru L LE with gait. Baseline: 05/29/2021 INITIAL  5 Pt will ambulate in community with SPC with good stability without significant pain Baseline: 05/29/2021 INITIAL                      LONG TERM GOALS:    LTG Name Target Date Goal status  1 Pt will demo L knee AROM to be 0 - 115 deg for improved stiffness, mobility, and performance of stairs. Baseline: 07/03/2021 INITIAL  2 Pt will ambulate extended community distance without significant pain or difficulty.  Baseline: 07/03/2021 INITIAL  3 Pt will ascend and descend stairs with a reciprocal gait with 1 rail without significant pain.  Baseline: 07/03/2021 INITIAL  4 Pt will demo improved L hip flexion strength to 5/5, hip abd strength to 4/5 MMT, and knee flex/ext strength to 5/5 MMT for improved tolerance to functional mobility and daily activities.  Baseline: 07/03/2021 INITIAL  5 Pt will be able to perform her normal daily transfers without significant difficulty.  Baseline: 07/03/2021 INITIAL  6 Pt will ambulate with a normalized heel to toe gait pattern without limping.  Baseline: 06/20/2021 INITIAL  7 Pt will be able to perform her ADLs and IADLs without significant lumbar pain or  07/03/2021 INITIAL    PLAN:  PT FREQUENCY:  3x/wk x 3 weeks, 2-3x/wk x 1 week, and 2x/wk x 4 weeks   PT DURATION: 8 weeks    PLANNED INTERVENTIONS: Therapeutic exercises, Therapeutic activity, Neuro Muscular re-education, Balance training, Gait training, Patient/Family education, Joint mobilization, Stair training, Electrical stimulation, Spinal mobilization, Cryotherapy, Moist heat, scar mobilization, Taping, and Manual therapy   PLAN FOR NEXT SESSION: Cont per TKA protocol.  Cont with quad activation, ROM, MT, and gait training.  Ice to reduce pain and soreness       Selinda Michaels III PT, DPT 05/24/21 11:00 PM

## 2021-05-27 ENCOUNTER — Other Ambulatory Visit: Payer: Self-pay

## 2021-05-27 ENCOUNTER — Ambulatory Visit (HOSPITAL_BASED_OUTPATIENT_CLINIC_OR_DEPARTMENT_OTHER): Payer: 59 | Admitting: Physical Therapy

## 2021-05-27 DIAGNOSIS — M6281 Muscle weakness (generalized): Secondary | ICD-10-CM

## 2021-05-27 DIAGNOSIS — M79605 Pain in left leg: Secondary | ICD-10-CM | POA: Diagnosis not present

## 2021-05-27 DIAGNOSIS — M25662 Stiffness of left knee, not elsewhere classified: Secondary | ICD-10-CM | POA: Diagnosis not present

## 2021-05-27 DIAGNOSIS — R262 Difficulty in walking, not elsewhere classified: Secondary | ICD-10-CM | POA: Diagnosis not present

## 2021-05-27 DIAGNOSIS — M545 Low back pain, unspecified: Secondary | ICD-10-CM | POA: Diagnosis not present

## 2021-05-27 DIAGNOSIS — M25562 Pain in left knee: Secondary | ICD-10-CM

## 2021-05-27 DIAGNOSIS — G8929 Other chronic pain: Secondary | ICD-10-CM | POA: Diagnosis not present

## 2021-05-27 DIAGNOSIS — M1712 Unilateral primary osteoarthritis, left knee: Secondary | ICD-10-CM | POA: Diagnosis not present

## 2021-05-27 DIAGNOSIS — M4150 Other secondary scoliosis, site unspecified: Secondary | ICD-10-CM | POA: Diagnosis not present

## 2021-05-27 NOTE — Therapy (Signed)
OUTPATIENT PHYSICAL THERAPY TREATMENT NOTE   Patient Name: [name] MRN: [id number] DOB:Jun 15, 1966, 55 y.o., female Today's Date: 05/27/2021  PCP: Mayra Neer, MD REFERRING PROVIDER: Mayra Neer, MD    Past Medical History:  Diagnosis Date   Acute medial meniscus tear of left knee 08/21/2020   Allergic rhinitis due to pollen 08/21/2020   Benign hypertension 08/21/2020   Genital warts 08/21/2020   Hematuria 08/21/2020   History of kidney stones    Major depression 08/21/2020   Mixed hyperlipidemia 08/21/2020   Obesity 08/21/2020   Pre-diabetes    Primary localized osteoarthritis of left knee 04/18/2021   Primary localized osteoarthritis of left knee    Radicular syndrome of left leg 04/23/2021   Scoliosis of lumbar region due to degenerative disease of spine in adult 04/23/2021   Sleep apnea    Somnolence 08/21/2020   Tobacco abuse 08/21/2020   Past Surgical History:  Procedure Laterality Date   ANKLE FRACTURE SURGERY     CESAREAN SECTION     FRACTURE SURGERY     HAND SURGERY     KNEE ARTHROSCOPY WITH MEDIAL MENISECTOMY Left 09/04/2020   Procedure: KNEE ARTHROSCOPY WITH MEDIAL MENISECTOMY;  Surgeon: Elsie Saas, MD;  Location: [place];  Service: Orthopedics;  Laterality: Left;   ROTATOR CUFF REPAIR     TOTAL KNEE ARTHROPLASTY Left 05/06/2021   Procedure: TOTAL KNEE ARTHROPLASTY;  Surgeon: Elsie Saas, MD;  Location: [facility];  Service: Orthopedics;  Laterality: Left;   TUBAL LIGATION     Patient Active Problem List   Diagnosis Date Noted   S/P total knee arthroplasty, left 05/06/2021   Radicular syndrome of left leg 04/23/2021   Scoliosis of lumbar region due to degenerative disease of spine in adult 04/23/2021   Primary localized osteoarthritis of left knee 04/18/2021   Allergic rhinitis due to pollen 08/21/2020   Benign hypertension 08/21/2020   Genital warts 08/21/2020   Hematuria 08/21/2020   Major depression 08/21/2020    Mixed hyperlipidemia 08/21/2020   Obesity 08/21/2020   Somnolence 08/21/2020   Tobacco abuse 08/21/2020   Acute medial meniscus tear of left knee 08/21/2020  REFERRING PROVIDER: Matthew Saras, PA*; Surgeon:  Dr. Marquette Saa DIAG: M17.12 (ICD-10-CM) - Primary localized osteoarthrosis of the knee, left  M54.50,M79.605 (ICD-10-CM) - Low back pain radiating to left leg  M41.50 (ICD-10-CM) - Scoliosis due to degenerative disease of spine in adult patient    THERAPY DIAG:  Pain in L knee Joint stiffness in L knee Muscle weakness Difficulty in walking Presence of artificial knee replacement   ONSET DATE: 05/06/2021 L TKA   SUBJECTIVE:    SUBJECTIVE STATEMENT:   Patient reports she is doing well She is having very little pain today. She was able [place].            PERTINENT HISTORY: L3 disc bulge and scoliosis.  R RCR, L ankle surgery.   PAIN:  Are you having pain? Yes NRPS scale:  3-4/10 currently Pain location: L knee Aggravating factors: standing, walking, and the morning Relieving factors: meds, ice   PRECAUTIONS: Other: Per TKA protocol.  Recent back issues   WEIGHT BEARING RESTRICTIONS WBAT     PATIENT GOALS: to be able to go for a nice long walk     OBJECTIVE:        OBSERVATION:   Incision site clean, dry, no drainage, and intact.  Pt not wearing compression hose today  though is normally wearing it.  Pt has bruising located proximal to incision and more significantly distal to the incision.       L knee AROM: -2 ext 110 flexion       TODAY'S TREATMENT: 11/21 Bike 5 min seat 9  -SAQ 2x10 reps -LAQ approx 15 reps -supine SLR 2x10  -supine heel slides AAROM with strap x10 reps each -standing heel raises 2x10 reps -step ups on 4 inch step 2 x 10 reps with 1 UE assist  - -Manual Therapy: -Grade I-II 4D patellar mobs to improve pain, ROM, and to normalize arthrokinematics -L knee flexion and  extension PROM in supine per jt stiffness and pt tolerance -supine manual knee extension stretch with heel propped in PT's hand.    Patient has had some lower back tightness. She was shown use of thera-cane  -Hamstring stretch 2x20 sec bilateral  -glute stretch ( modified on left to reduce torque  - LTR x20      -Reviewed pain level, response to prior Rx, and HEP compliance.  -Assessed ROM  -Pt performed: -sci fit bike x 5 mins on seat 10 -SAQ 2x10 reps -LAQ approx 15 reps -supine SLR 2x10  -supine heel slides AAROM with strap x10 reps each -standing heel raises 2x10 reps -step ups on 4 inch step 2 x 10 reps with 1 UE assist -TKE with RTB 2x10 reps -Gave pt a HEP handout and was educated in correct form and appropriate frequency.        -Manual Therapy: -Grade I-II 4D patellar mobs to improve pain, ROM, and to normalize arthrokinematics -L knee flexion and extension PROM in supine per jt stiffness and pt tolerance -supine manual knee extension stretch with heel propped in PT's hand.           PATIENT EDUCATION:  Education details:  Updated HEP.  Pt received an updated HEP handout and was educated in correct form and appropriate frequency.  objective findings, HEP, Exercise form, and POC.  Person educated: Patent  Education method: Explanation, Demonstration, Tactile cues, and Verbal cues.  Handout.  Education comprehension: verbalized understanding, returned demonstration, verbal cues required, and tactile cues required     HOME EXERCISE PROGRAM: Access Code: [email or web address] URL: [email or web address] Date: 05/24/2021 Prepared by: Aaron Edelman   Exercises Supine Quadricep Sets - 2-3 x daily - 7 x weekly - 2 sets - 10 reps - 5 seconds hold ANKLE PUMPS - 1 x daily - 7 x weekly - 3 sets - 10 reps Supine Hip Abduction AROM - 2 x daily - 7 x weekly - 2 sets - 10 reps Seated Knee Extension Stretch with Chair - 2-3 x daily - 7 x weekly - 1 sets - 2-3 reps - 2  minutes hold Supine Heel Slide - 2-3 x daily - 7 x weekly - 1 sets - 10 reps Standing Anterior Posterior Weight Shift with Chair - 1 x daily - 7 x weekly - 3 sets - 10 reps Supine Knee Extension Strengthening - 1 x daily - 7 x weekly - 3 sets - 10 reps Supine Heel Slide with Strap - 2 x daily - 7 x weekly - 1-2 sets - 10 reps - 3-5 seconds hold Supine Active Straight Leg Raise - 1 x daily - 7 x weekly - 2 sets - 10 reps Heel Raises with Counter Support - 1 x daily - 6-7 x weekly - 2 sets - 10 reps Standing Terminal Knee Extension with Resistance - 1 x  daily - 6-7 x weekly - 2 sets - 10 reps         ASSESSMENT:   CLINICAL IMPRESSION: Patient is making excellent progress. She was abe to tolerate 1lb weight today with SAQ. She tolerated standing exercises well> She continues to lack minor end range extension, but overall she is doing very well. Therapy will continue to progress as tolerated. Patient had reported that she had had some lower back pain recently        Objective impairments include Abnormal gait, decreased activity tolerance, decreased balance, decreased endurance, decreased mobility, difficulty walking, decreased ROM, decreased strength, hypomobility, increased edema, impaired flexibility, and pain. These impairments are limiting patient from cleaning, community activity, driving, meal prep, occupation, laundry, and shopping. Personal factors including  current back pain  are also affecting patient's functional outcome.       GOALS:     SHORT TERM GOALS:   STG Name Target Date Goal status  1 Pt will be independent and compliant with HEP for improved ROM, pain, strength, and function. Baseline:  05/29/2021 INITIAL  2 Pt will demo a good quad set and perform supine SLR with no > than minimal quad lag for improved strength and stability with gait.  Baseline:  11/23/20222 INITIAL  3 Pt will demo improved L knee AROM/PROM to 0 - 90/95 deg for improved mobility and  stiffness Baseline: 06/05/2021 INITIAL  4 Pt will demo improved TKE, toe off, and Wb'ing thru L LE with gait. Baseline: 05/29/2021 INITIAL  5 Pt will ambulate in community with [place] with good stability without significant pain Baseline: 05/29/2021 INITIAL                      LONG TERM GOALS:    LTG Name Target Date Goal status  1 Pt will demo L knee AROM to be 0 - 115 deg for improved stiffness, mobility, and performance of stairs. Baseline: 07/03/2021 INITIAL  2 Pt will ambulate extended community distance without significant pain or difficulty.  Baseline: 07/03/2021 INITIAL  3 Pt will ascend and descend stairs with a reciprocal gait with 1 rail without significant pain.  Baseline: 07/03/2021 INITIAL  4 Pt will demo improved L hip flexion strength to 5/5, hip abd strength to 4/5 MMT, and knee flex/ext strength to 5/5 MMT for improved tolerance to functional mobility and daily activities.  Baseline: 07/03/2021 INITIAL  5 Pt will be able to perform her normal daily transfers without significant difficulty.  Baseline: 07/03/2021 INITIAL  6 Pt will ambulate with a normalized heel to toe gait pattern without limping.  Baseline: 06/20/2021 INITIAL  7 Pt will be able to perform her ADLs and IADLs without significant lumbar pain or  07/03/2021 INITIAL    PLAN: PT FREQUENCY:  3x/wk x 3 weeks, 2-3x/wk x 1 week, and 2x/wk x 4 weeks   PT DURATION: 8 weeks   PLANNED INTERVENTIONS: Therapeutic exercises, Therapeutic activity, Neuro Muscular re-education, Balance training, Gait training, Patient/Family education, Joint mobilization, Stair training, Electrical stimulation, Spinal mobilization, Cryotherapy, Moist heat, scar mobilization, Taping, and Manual therapy   PLAN FOR NEXT SESSION: Cont per TKA protocol.  Cont with quad activation, ROM, MT, and gait training.  Ice to reduce pain and soreness      Carney Living PT DPT  05/27/2021, 1:51 PM

## 2021-05-29 ENCOUNTER — Ambulatory Visit (HOSPITAL_BASED_OUTPATIENT_CLINIC_OR_DEPARTMENT_OTHER): Payer: 59 | Admitting: Physical Therapy

## 2021-06-03 ENCOUNTER — Ambulatory Visit (HOSPITAL_BASED_OUTPATIENT_CLINIC_OR_DEPARTMENT_OTHER): Payer: 59 | Admitting: Physical Therapy

## 2021-06-03 ENCOUNTER — Other Ambulatory Visit: Payer: Self-pay

## 2021-06-03 ENCOUNTER — Encounter (HOSPITAL_BASED_OUTPATIENT_CLINIC_OR_DEPARTMENT_OTHER): Payer: Self-pay | Admitting: Physical Therapy

## 2021-06-03 DIAGNOSIS — M1712 Unilateral primary osteoarthritis, left knee: Secondary | ICD-10-CM | POA: Diagnosis not present

## 2021-06-03 DIAGNOSIS — A630 Anogenital (venereal) warts: Secondary | ICD-10-CM | POA: Diagnosis not present

## 2021-06-03 DIAGNOSIS — M25562 Pain in left knee: Secondary | ICD-10-CM

## 2021-06-03 DIAGNOSIS — R262 Difficulty in walking, not elsewhere classified: Secondary | ICD-10-CM | POA: Diagnosis not present

## 2021-06-03 DIAGNOSIS — J301 Allergic rhinitis due to pollen: Secondary | ICD-10-CM | POA: Diagnosis not present

## 2021-06-03 DIAGNOSIS — M545 Low back pain, unspecified: Secondary | ICD-10-CM | POA: Diagnosis not present

## 2021-06-03 DIAGNOSIS — Z0000 Encounter for general adult medical examination without abnormal findings: Secondary | ICD-10-CM | POA: Diagnosis not present

## 2021-06-03 DIAGNOSIS — R400 Somnolence: Secondary | ICD-10-CM | POA: Diagnosis not present

## 2021-06-03 DIAGNOSIS — M25662 Stiffness of left knee, not elsewhere classified: Secondary | ICD-10-CM | POA: Diagnosis not present

## 2021-06-03 DIAGNOSIS — M6281 Muscle weakness (generalized): Secondary | ICD-10-CM

## 2021-06-03 DIAGNOSIS — F3342 Major depressive disorder, recurrent, in full remission: Secondary | ICD-10-CM | POA: Diagnosis not present

## 2021-06-03 DIAGNOSIS — E669 Obesity, unspecified: Secondary | ICD-10-CM | POA: Diagnosis not present

## 2021-06-03 DIAGNOSIS — E782 Mixed hyperlipidemia: Secondary | ICD-10-CM | POA: Diagnosis not present

## 2021-06-03 DIAGNOSIS — M79605 Pain in left leg: Secondary | ICD-10-CM | POA: Diagnosis not present

## 2021-06-03 DIAGNOSIS — Z79899 Other long term (current) drug therapy: Secondary | ICD-10-CM | POA: Diagnosis not present

## 2021-06-03 DIAGNOSIS — Z23 Encounter for immunization: Secondary | ICD-10-CM | POA: Diagnosis not present

## 2021-06-03 DIAGNOSIS — G8929 Other chronic pain: Secondary | ICD-10-CM | POA: Diagnosis not present

## 2021-06-03 DIAGNOSIS — I10 Essential (primary) hypertension: Secondary | ICD-10-CM | POA: Diagnosis not present

## 2021-06-03 DIAGNOSIS — M4150 Other secondary scoliosis, site unspecified: Secondary | ICD-10-CM | POA: Diagnosis not present

## 2021-06-03 NOTE — Therapy (Addendum)
OUTPATIENT PHYSICAL THERAPY TREATMENT NOTE   Patient Name: [name] MRN: [id number] DOB:[birth year], 55 y.o., female Today's Date: 06/07/2021  PCP: Mayra Neer, MD      06/03/2021      1432   PT Visits / Re-Eval   Visit Number   9   Number of Visits   20   Date for PT Re-Evaluation   07/03/2021   Authorization   Authorization Type   --   Authorization Time Period   --   Authorization - Visit Number   --   Authorization - Number of Visits   --   Progress Note Due on Visit   --Progress Note Due on Visit.    PT Time Calculation   PT Start Time   1348   PT Stop Time   1430   PT Time Calculation (min)   42 min   PT - End of Session   Equipment Utilized During Treatment   --   Activity Tolerance   Patient tolerated treatment well   Behavior During Therapy   [facility] for tasks assessed/performed       Past Medical History:  Diagnosis Date   Acute medial meniscus tear of left knee 08/21/2020   Allergic rhinitis due to pollen 08/21/2020   Benign hypertension 08/21/2020   Genital warts 08/21/2020   Hematuria 08/21/2020   History of kidney stones    Major depression 08/21/2020   Mixed hyperlipidemia 08/21/2020   Obesity 08/21/2020   Pre-diabetes    Primary localized osteoarthritis of left knee 04/18/2021   Primary localized osteoarthritis of left knee    Radicular syndrome of left leg 04/23/2021   Scoliosis of lumbar region due to degenerative disease of spine in adult 04/23/2021   Sleep apnea    Somnolence 08/21/2020   Tobacco abuse 08/21/2020   Past Surgical History:  Procedure Laterality Date   ANKLE FRACTURE SURGERY     CESAREAN SECTION     FRACTURE SURGERY     HAND SURGERY     KNEE ARTHROSCOPY WITH MEDIAL MENISECTOMY Left 09/04/2020   Procedure: KNEE ARTHROSCOPY WITH MEDIAL MENISECTOMY;  Surgeon: Elsie Saas, MD;  Location: [place];  Service: Orthopedics;  Laterality: Left;   ROTATOR CUFF REPAIR     TOTAL KNEE ARTHROPLASTY Left  05/06/2021   Procedure: TOTAL KNEE ARTHROPLASTY;  Surgeon: Elsie Saas, MD;  Location: [facility];  Service: Orthopedics;  Laterality: Left;   TUBAL LIGATION     Patient Active Problem List   Diagnosis Date Noted   S/P total knee arthroplasty, left 05/06/2021   Radicular syndrome of left leg 04/23/2021   Scoliosis of lumbar region due to degenerative disease of spine in adult 04/23/2021   Primary localized osteoarthritis of left knee 04/18/2021   Allergic rhinitis due to pollen 08/21/2020   Benign hypertension 08/21/2020   Genital warts 08/21/2020   Hematuria 08/21/2020   Major depression 08/21/2020   Mixed hyperlipidemia 08/21/2020   Obesity 08/21/2020   Somnolence 08/21/2020   Tobacco abuse 08/21/2020   Acute medial meniscus tear of left knee 08/21/2020  REFERRING PROVIDER: Matthew Saras, PA*; Surgeon:  Dr. Noemi Chapel   REFERRING DIAG: M17.12 (ICD-10-CM) - Primary localized osteoarthrosis of the knee, left  M54.50,M79.605 (ICD-10-CM) - Low back pain radiating to left leg  M41.50 (ICD-10-CM) - Scoliosis due to degenerative disease of spine in adult patient    THERAPY DIAG:  Pain in L knee Joint stiffness in L knee Muscle weakness Difficulty in walking Presence of artificial  knee replacement   ONSET DATE: 05/06/2021 L TKA   SUBJECTIVE:    SUBJECTIVE STATEMENT:   Patient is 4 weeks s/p L TKA.  She reports no adverse effects after prior Rx.  Pt reports improved flexibility in knee.  "Walking is becoming much more normal".  Pt states she is unable to bend knee and hold leg to tie shoes like she wants to.  Pt reports she is doing well.          PERTINENT HISTORY: L3 disc bulge and scoliosis.  R RCR, L ankle surgery.   PAIN:  Are you having pain? Yes NRPS scale:  1-2/10 currently Pain location: L knee Aggravating factors: standing, walking, and the morning Relieving factors: meds, ice   PRECAUTIONS: Other: Per TKA protocol.  Recent back issues   WEIGHT BEARING  RESTRICTIONS WBAT     PATIENT GOALS: to be able to go for a nice long walk     OBJECTIVE:      L knee AROM: -1 deg ext 115/117 deg flexion A/AAROM       TODAY'S TREATMENT: Therapeutic Exercise: -Reviewed response to prior Rx, current function, pain level, and HEP compliance.  -Assessed ROM -Pt performed"   -Bike 5 min seat 8-9  -SAQ with 1#2x10 reps -LAQ with 1# 2x10 -supine SLR 2x10  -seated HS curls with YTB 2x10 reps -supine heel slides AAROM with strap x10 reps each -step ups on 4 inch step 2 x 10 reps with 1 UE assist -lateral step ups on 4 inch step 2x10 reps -standing hip abd AROM 2 x10 reps bilat -standing SLR flexion with YTB x10 reps each   -Manual Therapy: -Grade I-II 4D patellar mobs and PA jt mobs to L knee to improve pain, ROM, and to normalize arthrokinematics -L knee flexion and extension PROM in supine per jt stiffness and pt tolerance -supine manual knee extension stretch with heel propped in PT's hand.        PATIENT EDUCATION:  Education details:  Updated HEP.  Pt received an updated HEP handout and was educated in correct form and appropriate frequency.  objective findings, HEP, Exercise form, and POC.  Person educated: Patent  Education method: Explanation, Demonstration, Tactile cues, and Verbal cues.  Handout.  Education comprehension: verbalized understanding, returned demonstration, verbal cues required, and tactile cues required     HOME EXERCISE PROGRAM: Access Code: [email or web address] URL: [email or web address] Date: 05/24/2021 Prepared by: Aaron Edelman   Exercises Supine Quadricep Sets - 2-3 x daily - 7 x weekly - 2 sets - 10 reps - 5 seconds hold ANKLE PUMPS - 1 x daily - 7 x weekly - 3 sets - 10 reps Supine Hip Abduction AROM - 2 x daily - 7 x weekly - 2 sets - 10 reps Seated Knee Extension Stretch with Chair - 2-3 x daily - 7 x weekly - 1 sets - 2-3 reps - 2 minutes hold Supine Heel Slide - 2-3 x daily - 7 x weekly - 1  sets - 10 reps Standing Anterior Posterior Weight Shift with Chair - 1 x daily - 7 x weekly - 3 sets - 10 reps Supine Knee Extension Strengthening - 1 x daily - 7 x weekly - 3 sets - 10 reps Supine Heel Slide with Strap - 2 x daily - 7 x weekly - 1-2 sets - 10 reps - 3-5 seconds hold Supine Active Straight Leg Raise - 1 x daily - 7 x weekly - 2 sets - 10 reps Heel  Raises with Counter Support - 1 x daily - 6-7 x weekly - 2 sets - 10 reps Standing Terminal Knee Extension with Resistance - 1 x daily - 6-7 x weekly - 2 sets - 10 reps         ASSESSMENT:   CLINICAL IMPRESSION: Patient is making excellent progress.  Pt demonstrates improved extension and flexion AROM and continues to lack full knee extension.  Pt is progressing appropriately with protocol as evidenced by performance and progression of exercises.  Pt responded well to Rx having no increased pain after Rx, just reports of tightness.  Pt will cont to benefit from cont skilled PT services per protocol to address ongoing goals and to restore desired level of function.        Objective impairments include Abnormal gait, decreased activity tolerance, decreased balance, decreased endurance, decreased mobility, difficulty walking, decreased ROM, decreased strength, hypomobility, increased edema, impaired flexibility, and pain. These impairments are limiting patient from cleaning, community activity, driving, meal prep, occupation, laundry, and shopping. Personal factors including  current back pain  are also affecting patient's functional outcome.       GOALS:     SHORT TERM GOALS:   STG Name Target Date Goal status  1 Pt will be independent and compliant with HEP for improved ROM, pain, strength, and function. Baseline:  05/29/2021 INITIAL  2 Pt will demo a good quad set and perform supine SLR with no > than minimal quad lag for improved strength and stability with gait.  Baseline:  11/23/20222 INITIAL  3 Pt will demo improved L  knee AROM/PROM to 0 - 90/95 deg for improved mobility and stiffness Baseline: 06/05/2021 INITIAL  4 Pt will demo improved TKE, toe off, and Wb'ing thru L LE with gait. Baseline: 05/29/2021 INITIAL  5 Pt will ambulate in community with [place] with good stability without significant pain Baseline: 05/29/2021 INITIAL                      LONG TERM GOALS:    LTG Name Target Date Goal status  1 Pt will demo L knee AROM to be 0 - 115 deg for improved stiffness, mobility, and performance of stairs. Baseline: 07/03/2021 INITIAL  2 Pt will ambulate extended community distance without significant pain or difficulty.  Baseline: 07/03/2021 INITIAL  3 Pt will ascend and descend stairs with a reciprocal gait with 1 rail without significant pain.  Baseline: 07/03/2021 INITIAL  4 Pt will demo improved L hip flexion strength to 5/5, hip abd strength to 4/5 MMT, and knee flex/ext strength to 5/5 MMT for improved tolerance to functional mobility and daily activities.  Baseline: 07/03/2021 INITIAL  5 Pt will be able to perform her normal daily transfers without significant difficulty.  Baseline: 07/03/2021 INITIAL  6 Pt will ambulate with a normalized heel to toe gait pattern without limping.  Baseline: 06/20/2021 INITIAL  7 Pt will be able to perform her ADLs and IADLs without significant lumbar pain or  07/03/2021 INITIAL    PLAN: PT FREQUENCY:  3x/wk x 3 weeks, 2-3x/wk x 1 week, and 2x/wk x 4 weeks   PT DURATION: 8 weeks   PLANNED INTERVENTIONS: Therapeutic exercises, Therapeutic activity, Neuro Muscular re-education, Balance training, Gait training, Patient/Family education, Joint mobilization, Stair training, Electrical stimulation, Spinal mobilization, Cryotherapy, Moist heat, scar mobilization, Taping, and Manual therapy   PLAN FOR NEXT SESSION: Cont per TKA protocol.  PN next visit.  Cont with quad activation, ROM, MT, and  gait training.  Ice to reduce pain and soreness      Selinda Michaels III  PT, DPT 06/07/21 10:04 AM

## 2021-06-05 ENCOUNTER — Encounter (HOSPITAL_BASED_OUTPATIENT_CLINIC_OR_DEPARTMENT_OTHER): Payer: Self-pay | Admitting: Physical Therapy

## 2021-06-05 ENCOUNTER — Ambulatory Visit (HOSPITAL_BASED_OUTPATIENT_CLINIC_OR_DEPARTMENT_OTHER): Payer: 59 | Admitting: Physical Therapy

## 2021-06-05 ENCOUNTER — Other Ambulatory Visit: Payer: Self-pay

## 2021-06-05 DIAGNOSIS — R262 Difficulty in walking, not elsewhere classified: Secondary | ICD-10-CM | POA: Diagnosis not present

## 2021-06-05 DIAGNOSIS — M25662 Stiffness of left knee, not elsewhere classified: Secondary | ICD-10-CM

## 2021-06-05 DIAGNOSIS — M545 Low back pain, unspecified: Secondary | ICD-10-CM | POA: Diagnosis not present

## 2021-06-05 DIAGNOSIS — G8929 Other chronic pain: Secondary | ICD-10-CM | POA: Diagnosis not present

## 2021-06-05 DIAGNOSIS — M4150 Other secondary scoliosis, site unspecified: Secondary | ICD-10-CM | POA: Diagnosis not present

## 2021-06-05 DIAGNOSIS — M1712 Unilateral primary osteoarthritis, left knee: Secondary | ICD-10-CM | POA: Diagnosis not present

## 2021-06-05 DIAGNOSIS — M6281 Muscle weakness (generalized): Secondary | ICD-10-CM

## 2021-06-05 DIAGNOSIS — M79605 Pain in left leg: Secondary | ICD-10-CM | POA: Diagnosis not present

## 2021-06-05 DIAGNOSIS — M25562 Pain in left knee: Secondary | ICD-10-CM

## 2021-06-05 NOTE — Therapy (Signed)
OUTPATIENT PHYSICAL THERAPY TREATMENT NOTE/PROGRESS NOTE   Progress Note Reporting Period 05/08/2021 to 06/05/2021  See note below for Objective Data and Assessment of Progress/Goals.       Patient Name: [name] MRN: [id number] DOB:1965-11-20, 55 y.o., female Today's Date: 06/05/2021  PCP: Mayra Neer, MD   PT End of Session - 06/05/21 1155     Visit Number 10    Number of Visits 20    Date for PT Re-Evaluation 07/03/21    PT Start Time 1105    PT Stop Time 1150    PT Time Calculation (min) 45 min    Activity Tolerance Patient tolerated treatment well    Behavior During Therapy [facility] for tasks assessed/performed             Past Medical History:  Diagnosis Date   Acute medial meniscus tear of left knee 08/21/2020   Allergic rhinitis due to pollen 08/21/2020   Benign hypertension 08/21/2020   Genital warts 08/21/2020   Hematuria 08/21/2020   History of kidney stones    Major depression 08/21/2020   Mixed hyperlipidemia 08/21/2020   Obesity 08/21/2020   Pre-diabetes    Primary localized osteoarthritis of left knee 04/18/2021   Primary localized osteoarthritis of left knee    Radicular syndrome of left leg 04/23/2021   Scoliosis of lumbar region due to degenerative disease of spine in adult 04/23/2021   Sleep apnea    Somnolence 08/21/2020   Tobacco abuse 08/21/2020   Past Surgical History:  Procedure Laterality Date   ANKLE FRACTURE SURGERY     CESAREAN SECTION     FRACTURE SURGERY     HAND SURGERY     KNEE ARTHROSCOPY WITH MEDIAL MENISECTOMY Left 09/04/2020   Procedure: KNEE ARTHROSCOPY WITH MEDIAL MENISECTOMY;  Surgeon: Elsie Saas, MD;  Location: [place];  Service: Orthopedics;  Laterality: Left;   ROTATOR CUFF REPAIR     TOTAL KNEE ARTHROPLASTY Left 05/06/2021   Procedure: TOTAL KNEE ARTHROPLASTY;  Surgeon: Elsie Saas, MD;  Location: [facility];  Service: Orthopedics;  Laterality: Left;   TUBAL LIGATION     Patient  Active Problem List   Diagnosis Date Noted   S/P total knee arthroplasty, left 05/06/2021   Radicular syndrome of left leg 04/23/2021   Scoliosis of lumbar region due to degenerative disease of spine in adult 04/23/2021   Primary localized osteoarthritis of left knee 04/18/2021   Allergic rhinitis due to pollen 08/21/2020   Benign hypertension 08/21/2020   Genital warts 08/21/2020   Hematuria 08/21/2020   Major depression 08/21/2020   Mixed hyperlipidemia 08/21/2020   Obesity 08/21/2020   Somnolence 08/21/2020   Tobacco abuse 08/21/2020   Acute medial meniscus tear of left knee 08/21/2020   REFERRING PROVIDER: Matthew Saras, PA*; Surgeon:  Dr. Noemi Chapel   REFERRING DIAG: M17.12 (ICD-10-CM) - Primary localized osteoarthrosis of the knee, left  M54.50,M79.605 (ICD-10-CM) - Low back pain radiating to left leg  M41.50 (ICD-10-CM) - Scoliosis due to degenerative disease of spine in adult patient    THERAPY DIAG:  Pain in L knee Joint stiffness in L knee Muscle weakness Difficulty in walking Presence of artificial knee replacement   ONSET DATE: 05/06/2021 L TKA   SUBJECTIVE:    SUBJECTIVE STATEMENT:   Patient is 4 weeks and 2 days s/p L TKA.  Pt reports compliance with HEP.   RESPONSE TO PRIOR RX:  Pt states she had some muscle soreness after prior Rx though she was achy in  general due to having shingles vaccine.  Pt denies any adverse effects.   FUNCTIONAL IMPROVEMENTS:  Pt states she is walking more normal.  Ascending stairs with reciprocal gait.  Donning shoes.  ROM. Car transfers FUNCTIONAL LIMITATIONS:   Pt states she is unable to bend knee and hold leg to tie shoes like she wants to.  donning shoes.  Ambulation.  Standing duration and activities.  Not working.  Sleeping.         PERTINENT HISTORY: L3 disc bulge and scoliosis.  R RCR, L ankle surgery.   PAIN:  Are you having pain? Yes NRPS scale:  1/10 currently and best, 3-4/10 worst Pain location: L  knee Aggravating factors: standing, walking, and the morning Relieving factors: meds, ice   PRECAUTIONS: Other: Per TKA protocol.  Recent back issues   WEIGHT BEARING RESTRICTIONS WBAT     PATIENT GOALS: to be able to go for a nice long walk     OBJECTIVE:    FOTO:  59   L knee AROM/PROM: Extension:  -2/0 deg Flexion:  115/122 deg      GAIT:  Pt ambulates with a heel to toe gait pattern without limping.  She does have limited TKE on L  STRENGTH:   Pt able to perform supine SLR well independently with slight quad lag probably due to lacking full extension.   MMT:  Hip flexion:  4/5, Hip abd:  4+/5, Knee extension:  pt tolerated minimal resistance well with minimal pain (1/10).     TODAY'S TREATMENT: Therapeutic Exercise: -Reviewed response to prior Rx, current function, pain level, and HEP compliance.  -Assessed ROM and strength -Pt performed"   -Bike 5 min seat 8-9  -LAQ with 2# 2x10 -supine SLR 2x10  -seated HS curls with YTB 2x10 reps -supine heel slides AAROM with strap x15 reps each    -Manual Therapy: -Grade II-III 4D patellar mobs and PA jt mobs to L knee to improve pain, ROM, and to normalize arthrokinematics -L knee flexion and extension PROM in supine per jt stiffness and pt tolerance -supine manual knee extension stretch with heel propped in PT's hand.    -Therapeutic Activities:  (to improve functional mobility and functional strength) -step ups on 6 inch step 2 x 10 reps with 1 UE assist -lateral step ups on 4 inch step 2x10 reps -mini squats to table 2x10 reps     PATIENT EDUCATION:  Education details:  objective findings, HEP, Exercise form, and POC.  Person educated: Patent  Education method: Explanation, Demonstration, Tactile cues, and Verbal cues.  Handout.  Education comprehension: verbalized understanding, returned demonstration, verbal cues required, and tactile cues required     HOME EXERCISE PROGRAM: Access Code: [email or web address] URL:  [email or web address] Date: 05/24/2021 Prepared by: Ronny Flurry   Exercises Supine Quadricep Sets - 2-3 x daily - 7 x weekly - 2 sets - 10 reps - 5 seconds hold ANKLE PUMPS - 1 x daily - 7 x weekly - 3 sets - 10 reps Supine Hip Abduction AROM - 2 x daily - 7 x weekly - 2 sets - 10 reps Seated Knee Extension Stretch with Chair - 2-3 x daily - 7 x weekly - 1 sets - 2-3 reps - 2 minutes hold Supine Heel Slide - 2-3 x daily - 7 x weekly - 1 sets - 10 reps Standing Anterior Posterior Weight Shift with Chair - 1 x daily - 7 x weekly - 3 sets - 10 reps Supine Knee Extension Strengthening -  1 x daily - 7 x weekly - 3 sets - 10 reps Supine Heel Slide with Strap - 2 x daily - 7 x weekly - 1-2 sets - 10 reps - 3-5 seconds hold Supine Active Straight Leg Raise - 1 x daily - 7 x weekly - 2 sets - 10 reps Heel Raises with Counter Support - 1 x daily - 6-7 x weekly - 2 sets - 10 reps Standing Terminal Knee Extension with Resistance - 1 x daily - 6-7 x weekly - 2 sets - 10 reps         ASSESSMENT:   CLINICAL IMPRESSION:  Patient is 4 weeks and 2 days s/p L TKA and is making excellent progress.  Pt continues to lack full knee extension actively though is making good progress and has full knee extension passively.  Pt has excellent flexion ROM.  Pt is progressing appropriately with protocol as evidenced by performance and progression of exercises.  Pt is improving in all functional mobility skills including ambulating without a limp.  She demonstrates improved gait and is not using an AD.  Pt's FOTO score improved from 26 to 59.  Pt has met all STG's except extension AROM goal.  Pt responded well to Rx having no increased pain after Rx, just reports of tightness.  Pt will cont to benefit from cont skilled PT services per protocol to address ongoing goals and to restore desired level of function.      Objective impairments include Abnormal gait, decreased activity tolerance, decreased balance,  decreased endurance, decreased mobility, difficulty walking, decreased ROM, decreased strength, hypomobility, increased edema, impaired flexibility, and pain. These impairments are limiting patient from cleaning, community activity, driving, meal prep, occupation, laundry, and shopping. Personal factors including  current back pain  are also affecting patient's functional outcome.       GOALS:     SHORT TERM GOALS:   STG Name Target Date Goal status  1 Pt will be independent and compliant with HEP for improved ROM, pain, strength, and function. Baseline:  05/29/2021 GOAL MET  2 Pt will demo a good quad set and perform supine SLR with no > than minimal quad lag for improved strength and stability with gait.  Baseline:  11/23/20222 GOAL MET  3 Pt will demo improved L knee AROM/PROM to 0 - 90/95 deg for improved mobility and stiffness Baseline: 06/05/2021 PARTIALLY MET  4 Pt will demo improved TKE, toe off, and Wb'ing thru L LE with gait. Baseline: 05/29/2021 GOAL MET  5 Pt will ambulate in community with [place] with good stability without significant pain Baseline: 05/29/2021 GOAL MET                      LONG TERM GOALS:    LTG Name Target Date Goal status  1 Pt will demo L knee AROM to be 0 - 115 deg for improved stiffness, mobility, and performance of stairs. Baseline: 07/03/2021 PARTIALLY MET  2 Pt will ambulate extended community distance without significant pain or difficulty.  Baseline: 07/03/2021 INITIAL  3 Pt will ascend and descend stairs with a reciprocal gait with 1 rail without significant pain.  Baseline: 07/03/2021 INITIAL  4 Pt will demo improved L hip flexion strength to 5/5, hip abd strength to 4/5 MMT, and knee flex/ext strength to 5/5 MMT for improved tolerance to functional mobility and daily activities.  Baseline: 07/03/2021 INITIAL  5 Pt will be able to perform her normal daily transfers without significant  difficulty.  Baseline: 07/03/2021 INITIAL  6 Pt will  ambulate with a normalized heel to toe gait pattern without limping.  Baseline: 06/20/2021 INITIAL  7 Pt will be able to perform her ADLs and IADLs without significant lumbar pain or  07/03/2021 INITIAL    PLAN: PT FREQUENCY:  3x/wk this week and 2x/wk x 4 weeks   PT DURATION: 4 weeks   PLANNED INTERVENTIONS: Therapeutic exercises, Therapeutic activity, Neuro Muscular re-education, Balance training, Gait training, Patient/Family education, Joint mobilization, Stair training, Electrical stimulation, Spinal mobilization, Cryotherapy, Moist heat, scar mobilization, Taping, and Manual therapy   PLAN FOR NEXT SESSION: Cont strengthening, manual therapy, and ROM per TKA protocol.  Gait training.  Ice to reduce pain and soreness      Selinda Michaels III PT, DPT 06/05/21 10:12 PM

## 2021-06-07 ENCOUNTER — Other Ambulatory Visit: Payer: Self-pay

## 2021-06-07 ENCOUNTER — Encounter (HOSPITAL_BASED_OUTPATIENT_CLINIC_OR_DEPARTMENT_OTHER): Payer: Self-pay | Admitting: Physical Therapy

## 2021-06-07 ENCOUNTER — Ambulatory Visit (HOSPITAL_BASED_OUTPATIENT_CLINIC_OR_DEPARTMENT_OTHER): Payer: 59 | Attending: Physician Assistant | Admitting: Physical Therapy

## 2021-06-07 DIAGNOSIS — R262 Difficulty in walking, not elsewhere classified: Secondary | ICD-10-CM | POA: Diagnosis not present

## 2021-06-07 DIAGNOSIS — M6281 Muscle weakness (generalized): Secondary | ICD-10-CM | POA: Diagnosis not present

## 2021-06-07 DIAGNOSIS — M25662 Stiffness of left knee, not elsewhere classified: Secondary | ICD-10-CM | POA: Diagnosis not present

## 2021-06-07 DIAGNOSIS — M25562 Pain in left knee: Secondary | ICD-10-CM | POA: Insufficient documentation

## 2021-06-07 NOTE — Therapy (Signed)
OUTPATIENT PHYSICAL THERAPY TREATMENT NOTE   Patient Name: [name] MRN: [id number] DOB:[birth year], 55 y.o., female Today's Date: 06/07/2021  PCP: Mayra Neer, MD REFERRING PROVIDER: Mayra Neer, MD    Past Medical History:  Diagnosis Date   Acute medial meniscus tear of left knee 08/21/2020   Allergic rhinitis due to pollen 08/21/2020   Benign hypertension 08/21/2020   Genital warts 08/21/2020   Hematuria 08/21/2020   History of kidney stones    Major depression 08/21/2020   Mixed hyperlipidemia 08/21/2020   Obesity 08/21/2020   Pre-diabetes    Primary localized osteoarthritis of left knee 04/18/2021   Primary localized osteoarthritis of left knee    Radicular syndrome of left leg 04/23/2021   Scoliosis of lumbar region due to degenerative disease of spine in adult 04/23/2021   Sleep apnea    Somnolence 08/21/2020   Tobacco abuse 08/21/2020   Past Surgical History:  Procedure Laterality Date   ANKLE FRACTURE SURGERY     CESAREAN SECTION     FRACTURE SURGERY     HAND SURGERY     KNEE ARTHROSCOPY WITH MEDIAL MENISECTOMY Left 09/04/2020   Procedure: KNEE ARTHROSCOPY WITH MEDIAL MENISECTOMY;  Surgeon: Elsie Saas, MD;  Location: [facility];  Service: Orthopedics;  Laterality: Left;   ROTATOR CUFF REPAIR     TOTAL KNEE ARTHROPLASTY Left 05/06/2021   Procedure: TOTAL KNEE ARTHROPLASTY;  Surgeon: Elsie Saas, MD;  Location: [facility];  Service: Orthopedics;  Laterality: Left;   TUBAL LIGATION     Patient Active Problem List   Diagnosis Date Noted   S/P total knee arthroplasty, left 05/06/2021   Radicular syndrome of left leg 04/23/2021   Scoliosis of lumbar region due to degenerative disease of spine in adult 04/23/2021   Primary localized osteoarthritis of left knee 04/18/2021   Allergic rhinitis due to pollen 08/21/2020   Benign hypertension 08/21/2020   Genital warts 08/21/2020   Hematuria 08/21/2020   Major depression 08/21/2020    Mixed hyperlipidemia 08/21/2020   Obesity 08/21/2020   Somnolence 08/21/2020   Tobacco abuse 08/21/2020   Acute medial meniscus tear of left knee 08/21/2020   REFERRING PROVIDER: Matthew Saras, PA*; Surgeon:  Dr. Noemi Chapel   REFERRING DIAG: M17.12 (ICD-10-CM) - Primary localized osteoarthrosis of the knee, left  M54.50,M79.605 (ICD-10-CM) - Low back pain radiating to left leg  M41.50 (ICD-10-CM) - Scoliosis due to degenerative disease of spine in adult patient    THERAPY DIAG:  Pain in L knee Joint stiffness in L knee Muscle weakness Difficulty in walking Presence of artificial knee replacement   ONSET DATE: 05/06/2021 L TKA   SUBJECTIVE:    SUBJECTIVE STATEMENT:   Patient is 4 weeks and 2 days s/p L TKA.  Pt reports compliance with HEP.   RESPONSE TO PRIOR RX:  Pt states she had some muscle soreness after prior Rx though she was achy in general due to having shingles vaccine.  Pt denies any adverse effects.   FUNCTIONAL IMPROVEMENTS:  Pt states she is walking more normal.  Ascending stairs with reciprocal gait.  Donning shoes.  ROM. Car transfers FUNCTIONAL LIMITATIONS:   Pt states she is unable to bend knee and hold leg to tie shoes like she wants to.  donning shoes.  Ambulation.  Standing duration and activities.  Not working.  Sleeping.         PERTINENT HISTORY: L3 disc bulge and scoliosis.  R RCR, L ankle surgery.   PAIN:  Are you having  pain?No  Just stiffness    PRECAUTIONS: Other: Per TKA protocol.  Recent back issues   WEIGHT BEARING RESTRICTIONS WBAT     PATIENT GOALS: to be able to go for a nice long walk     OBJECTIVE:    FOTO:  59   L knee AROM/PROM: Extension:  -2/0 deg Flexion:  115/122 deg                 GAIT:  Pt ambulates with a heel to toe gait pattern without limping.  She does have limited TKE on L   STRENGTH:   Pt able to perform supine SLR well independently with slight quad lag probably due to lacking full extension.   MMT:  Hip  flexion:  4/5, Hip abd:  4+/5, Knee extension:  pt tolerated minimal resistance well with minimal pain (1/10).     TODAY'S TREATMENT: 12/2 -Reviewed response to prior Rx, current function, pain level, and HEP compliance.  -Assessed ROM and strength ROM 0-125     -Bike 5 min seat 8-9  -LAQ with 2# 2x10 -supine SLR 2x10   -Therapeutic Activities:  (to improve functional mobility and functional strength) -step ups on 6 inch step 2 x 10 reps with 1 UE assist -lateral step ups on 4 inch step 2x10 reps -mini squats to table 2x10 reps   12/28  Therapeutic Exercise: -Reviewed response to prior Rx, current function, pain level, and HEP compliance.  -Assessed ROM and strength -Pt performed"   -Bike 5 min seat 8-9  -LAQ with 2# 2x10 -supine SLR 2x10  -seated HS curls with YTB 2x10 reps -supine heel slides AAROM with strap x15 reps each      -Manual Therapy: -Grade II-III 4D patellar mobs and PA jt mobs to L knee to improve pain, ROM, and to normalize arthrokinematics -L knee flexion and extension PROM in supine per jt stiffness and pt tolerance -supine manual knee extension stretch with heel propped in PT's hand.     -Therapeutic Activities:  (to improve functional mobility and functional strength) -step ups on 6 inch step 2 x 10 reps with 1 UE assist -lateral step ups on 4 inch step 2x10 reps -mini squats to table 2x10 reps       PATIENT EDUCATION:  Education details:  objective findings, HEP, Exercise form, and POC.  Person educated: Patent  Education method: Explanation, Demonstration, Tactile cues, and Verbal cues.  Handout.  Education comprehension: verbalized understanding, returned demonstration, verbal cues required, and tactile cues required     HOME EXERCISE PROGRAM: Access Code: [email or web address] URL: [email or web address] Date: 05/24/2021 Prepared by: Ronny Flurry   Exercises Supine Quadricep Sets - 2-3 x daily - 7 x weekly - 2 sets - 10 reps - 5 seconds  hold ANKLE PUMPS - 1 x daily - 7 x weekly - 3 sets - 10 reps Supine Hip Abduction AROM - 2 x daily - 7 x weekly - 2 sets - 10 reps Seated Knee Extension Stretch with Chair - 2-3 x daily - 7 x weekly - 1 sets - 2-3 reps - 2 minutes hold Supine Heel Slide - 2-3 x daily - 7 x weekly - 1 sets - 10 reps Standing Anterior Posterior Weight Shift with Chair - 1 x daily - 7 x weekly - 3 sets - 10 reps Supine Knee Extension Strengthening - 1 x daily - 7 x weekly - 3 sets - 10 reps Supine Heel Slide with Strap - 2 x daily - 7  x weekly - 1-2 sets - 10 reps - 3-5 seconds hold Supine Active Straight Leg Raise - 1 x daily - 7 x weekly - 2 sets - 10 reps Heel Raises with Counter Support - 1 x daily - 6-7 x weekly - 2 sets - 10 reps Standing Terminal Knee Extension with Resistance - 1 x daily - 6-7 x weekly - 2 sets - 10 reps         ASSESSMENT:   CLINICAL IMPRESSION: Patient is making excellent progress.  She had 0-125 degrees of motion today without pushing it. She had no significant increase pain with treatment. She was able to advance her lateral step up to 6 inches without pain. We were also able to add instability training on the air-ex. She did well. We will continue to progress functional exercises as tolerated.    Objective impairments include Abnormal gait, decreased activity tolerance, decreased balance, decreased endurance, decreased mobility, difficulty walking, decreased ROM, decreased strength, hypomobility, increased edema, impaired flexibility, and pain. These impairments are limiting patient from cleaning, community activity, driving, meal prep, occupation, laundry, and shopping. Personal factors including  current back pain  are also affecting patient's functional outcome.       GOALS:     SHORT TERM GOALS:   STG Name Target Date Goal status  1 Pt will be independent and compliant with HEP for improved ROM, pain, strength, and function. Baseline:  05/29/2021 GOAL MET  2 Pt will demo  a good quad set and perform supine SLR with no > than minimal quad lag for improved strength and stability with gait.  Baseline:  11/23/20222 GOAL MET  3 Pt will demo improved L knee AROM/PROM to 0 - 90/95 deg for improved mobility and stiffness Baseline: 06/05/2021 PARTIALLY MET  4 Pt will demo improved TKE, toe off, and Wb'ing thru L LE with gait. Baseline: 05/29/2021 GOAL MET  5 Pt will ambulate in community with [place] with good stability without significant pain Baseline: 05/29/2021 GOAL MET                      LONG TERM GOALS:    LTG Name Target Date Goal status  1 Pt will demo L knee AROM to be 0 - 115 deg for improved stiffness, mobility, and performance of stairs. Baseline: 07/03/2021 PARTIALLY MET  2 Pt will ambulate extended community distance without significant pain or difficulty.  Baseline: 07/03/2021 INITIAL  3 Pt will ascend and descend stairs with a reciprocal gait with 1 rail without significant pain.  Baseline: 07/03/2021 INITIAL  4 Pt will demo improved L hip flexion strength to 5/5, hip abd strength to 4/5 MMT, and knee flex/ext strength to 5/5 MMT for improved tolerance to functional mobility and daily activities.  Baseline: 07/03/2021 INITIAL  5 Pt will be able to perform her normal daily transfers without significant difficulty.  Baseline: 07/03/2021 INITIAL  6 Pt will ambulate with a normalized heel to toe gait pattern without limping.  Baseline: 06/20/2021 INITIAL  7 Pt will be able to perform her ADLs and IADLs without significant lumbar pain or  07/03/2021 INITIAL    PLAN: PT FREQUENCY:  3x/wk this week and 2x/wk x 4 weeks   PT DURATION: 4 weeks   PLANNED INTERVENTIONS: Therapeutic exercises, Therapeutic activity, Neuro Muscular re-education, Balance training, Gait training, Patient/Family education, Joint mobilization, Stair training, Electrical stimulation, Spinal mobilization, Cryotherapy, Moist heat, scar mobilization, Taping, and Manual therapy   PLAN  FOR NEXT SESSION:  Cont strengthening, manual therapy, and ROM per TKA protocol.  Gait training.  Ice to reduce pain and soreness      Carney Living PT DPT  06/07/2021, 2:00 PM

## 2021-06-13 ENCOUNTER — Other Ambulatory Visit: Payer: Self-pay

## 2021-06-13 ENCOUNTER — Ambulatory Visit (HOSPITAL_BASED_OUTPATIENT_CLINIC_OR_DEPARTMENT_OTHER): Payer: 59 | Admitting: Physical Therapy

## 2021-06-13 ENCOUNTER — Encounter (HOSPITAL_BASED_OUTPATIENT_CLINIC_OR_DEPARTMENT_OTHER): Payer: Self-pay | Admitting: Physical Therapy

## 2021-06-13 DIAGNOSIS — M25662 Stiffness of left knee, not elsewhere classified: Secondary | ICD-10-CM

## 2021-06-13 DIAGNOSIS — M6281 Muscle weakness (generalized): Secondary | ICD-10-CM | POA: Diagnosis not present

## 2021-06-13 DIAGNOSIS — M25562 Pain in left knee: Secondary | ICD-10-CM | POA: Diagnosis not present

## 2021-06-13 DIAGNOSIS — R262 Difficulty in walking, not elsewhere classified: Secondary | ICD-10-CM | POA: Diagnosis not present

## 2021-06-13 NOTE — Therapy (Signed)
OUTPATIENT PHYSICAL THERAPY TREATMENT NOTE   Patient Name: GLORI MACHNIK MRN: [id number] DOB:February 08, 1966, 55 y.o., female Today's Date: 06/13/2021  PCP: Mayra Neer, MD REFERRING PROVIDER: Mayra Neer, MD    Past Medical History:  Diagnosis Date   Acute medial meniscus tear of left knee 08/21/2020   Allergic rhinitis due to pollen 08/21/2020   Benign hypertension 08/21/2020   Genital warts 08/21/2020   Hematuria 08/21/2020   History of kidney stones    Major depression 08/21/2020   Mixed hyperlipidemia 08/21/2020   Obesity 08/21/2020   Pre-diabetes    Primary localized osteoarthritis of left knee 04/18/2021   Primary localized osteoarthritis of left knee    Radicular syndrome of left leg 04/23/2021   Scoliosis of lumbar region due to degenerative disease of spine in adult 04/23/2021   Sleep apnea    Somnolence 08/21/2020   Tobacco abuse 08/21/2020   Past Surgical History:  Procedure Laterality Date   ANKLE FRACTURE SURGERY     CESAREAN SECTION     FRACTURE SURGERY     HAND SURGERY     KNEE ARTHROSCOPY WITH MEDIAL MENISECTOMY Left 09/04/2020   Procedure: KNEE ARTHROSCOPY WITH MEDIAL MENISECTOMY;  Surgeon: Elsie Saas, MD;  Location: [facility];  Service: Orthopedics;  Laterality: Left;   ROTATOR CUFF REPAIR     TOTAL KNEE ARTHROPLASTY Left 05/06/2021   Procedure: TOTAL KNEE ARTHROPLASTY;  Surgeon: Elsie Saas, MD;  Location: [facility];  Service: Orthopedics;  Laterality: Left;   TUBAL LIGATION     Patient Active Problem List   Diagnosis Date Noted   S/P total knee arthroplasty, left 05/06/2021   Radicular syndrome of left leg 04/23/2021   Scoliosis of lumbar region due to degenerative disease of spine in adult 04/23/2021   Primary localized osteoarthritis of left knee 04/18/2021   Allergic rhinitis due to pollen 08/21/2020   Benign hypertension 08/21/2020   Genital warts 08/21/2020   Hematuria 08/21/2020   Major depression 08/21/2020    Mixed hyperlipidemia 08/21/2020   Obesity 08/21/2020   Somnolence 08/21/2020   Tobacco abuse 08/21/2020   Acute medial meniscus tear of left knee 08/21/2020   REFERRING PROVIDER: Matthew Saras, PA*; Surgeon:  Dr. Marquette Saa DIAG: M17.12 (ICD-10-CM) - Primary localized osteoarthrosis of the knee, left  M54.50,M79.605 (ICD-10-CM) - Low back pain radiating to left leg  M41.50 (ICD-10-CM) - Scoliosis due to degenerative disease of spine in adult patient    THERAPY DIAG:  Pain in L knee Joint stiffness in L knee Muscle weakness Difficulty in walking Presence of artificial knee replacement   ONSET DATE: 05/06/2021 L TKA   SUBJECTIVE:    SUBJECTIVE STATEMENT:   Patient has been to the hospital with her brother for long epriods of the day over the last few days. Last night her knee was very irritated. She feels like she couldn't bend it. She reports it feels better today but is still limited in flexion. She has been to the MD who is very happy with her progress. She will be returning to work on 07/08/2020.         PERTINENT HISTORY: L3 disc bulge and scoliosis.  R RCR, L ankle surgery.   PAIN:  Are you having pain?No  Just stiffness    PRECAUTIONS: Other: Per TKA protocol.  Recent back issues   WEIGHT BEARING RESTRICTIONS WBAT     PATIENT GOALS: to be able to go for a nice long walk     OBJECTIVE:  TODAY'S  TREATMENT: 06/13/2021 - PROM into flexion: caused increased pain, limited range compared to last visit. Manual halted  - quad set 3x10  - SLR 3x10  - Standing weight shifts 2x10  -Standing march 2x10   Vaso: low pressure: 32 degrees in elevated position 15 min          12/2 -Reviewed response to prior Rx, current function, pain level, and HEP compliance.  -Assessed ROM and strength ROM 0-125      -Bike 5 min seat 8-9  -LAQ with 2# 2x10 -supine SLR 2x10    -Therapeutic Activities:  (to improve functional mobility and functional strength) -step  ups on 6 inch step 2 x 10 reps with 1 UE assist -lateral step ups on 4 inch step 2x10 reps -mini squats to table 2x10 reps     11/28   Therapeutic Exercise: -Reviewed response to prior Rx, current function, pain level, and HEP compliance.  -Assessed ROM and strength -Pt performed"   -Bike 5 min seat 8-9  -LAQ with 2# 2x10 -supine SLR 2x10  -seated HS curls with YTB 2x10 reps -supine heel slides AAROM with strap x15 reps each      -Manual Therapy: -Grade II-III 4D patellar mobs and PA jt mobs to L knee to improve pain, ROM, and to normalize arthrokinematics -L knee flexion and extension PROM in supine per jt stiffness and pt tolerance -supine manual knee extension stretch with heel propped in PT's hand.     -Therapeutic Activities:  (to improve functional mobility and functional strength) -step ups on 6 inch step 2 x 10 reps with 1 UE assist -lateral step ups on 4 inch step 2x10 reps -mini squats to table 2x10 reps       PATIENT EDUCATION:  Education details:  objective findings, HEP, Exercise form, and POC.  Person educated: Patent  Education method: Explanation, Demonstration, Tactile cues, and Verbal cues.  Handout.  Education comprehension: verbalized understanding, returned demonstration, verbal cues required, and tactile cues required     HOME EXERCISE PROGRAM: Access Code: [email or web address] URL: [email or web address] Date: 05/24/2021 Prepared by: Ronny Flurry   Exercises Supine Quadricep Sets - 2-3 x daily - 7 x weekly - 2 sets - 10 reps - 5 seconds hold ANKLE PUMPS - 1 x daily - 7 x weekly - 3 sets - 10 reps Supine Hip Abduction AROM - 2 x daily - 7 x weekly - 2 sets - 10 reps Seated Knee Extension Stretch with Chair - 2-3 x daily - 7 x weekly - 1 sets - 2-3 reps - 2 minutes hold Supine Heel Slide - 2-3 x daily - 7 x weekly - 1 sets - 10 reps Standing Anterior Posterior Weight Shift with Chair - 1 x daily - 7 x weekly - 3 sets - 10 reps Supine Knee  Extension Strengthening - 1 x daily - 7 x weekly - 3 sets - 10 reps Supine Heel Slide with Strap - 2 x daily - 7 x weekly - 1-2 sets - 10 reps - 3-5 seconds hold Supine Active Straight Leg Raise - 1 x daily - 7 x weekly - 2 sets - 10 reps Heel Raises with Counter Support - 1 x daily - 6-7 x weekly - 2 sets - 10 reps Standing Terminal Knee Extension with Resistance - 1 x daily - 6-7 x weekly - 2 sets - 10 reps         ASSESSMENT:   CLINICAL IMPRESSION: Patient was inflamed today. She tolerated the  exercises she did well but she was not pushed. She likely just needs to rest for a day or two and perform light activity, but she needs to go back to the hospital. Therapy performed vaso on her to reduce inflammation. We will progress her back into her full activity as tolerated.   Objective impairments include Abnormal gait, decreased activity tolerance, decreased balance, decreased endurance, decreased mobility, difficulty walking, decreased ROM, decreased strength, hypomobility, increased edema, impaired flexibility, and pain. These impairments are limiting patient from cleaning, community activity, driving, meal prep, occupation, laundry, and shopping. Personal factors including  current back pain  are also affecting patient's functional outcome.       GOALS:     SHORT TERM GOALS:   STG Name Target Date Goal status  1 Pt will be independent and compliant with HEP for improved ROM, pain, strength, and function. Baseline:  05/29/2021 GOAL MET  2 Pt will demo a good quad set and perform supine SLR with no > than minimal quad lag for improved strength and stability with gait.  Baseline:  11/23/20222 GOAL MET  3 Pt will demo improved L knee AROM/PROM to 0 - 90/95 deg for improved mobility and stiffness Baseline: 06/05/2021 PARTIALLY MET  4 Pt will demo improved TKE, toe off, and Wb'ing thru L LE with gait. Baseline: 05/29/2021 GOAL MET  5 Pt will ambulate in community with [place] with good  stability without significant pain Baseline: 05/29/2021 GOAL MET                      LONG TERM GOALS:    LTG Name Target Date Goal status  1 Pt will demo L knee AROM to be 0 - 115 deg for improved stiffness, mobility, and performance of stairs. Baseline: 07/03/2021 PARTIALLY MET  2 Pt will ambulate extended community distance without significant pain or difficulty.  Baseline: 07/03/2021 INITIAL  3 Pt will ascend and descend stairs with a reciprocal gait with 1 rail without significant pain.  Baseline: 07/03/2021 INITIAL  4 Pt will demo improved L hip flexion strength to 5/5, hip abd strength to 4/5 MMT, and knee flex/ext strength to 5/5 MMT for improved tolerance to functional mobility and daily activities.  Baseline: 07/03/2021 INITIAL  5 Pt will be able to perform her normal daily transfers without significant difficulty.  Baseline: 07/03/2021 INITIAL  6 Pt will ambulate with a normalized heel to toe gait pattern without limping.  Baseline: 06/20/2021 INITIAL  7 Pt will be able to perform her ADLs and IADLs without significant lumbar pain or  07/03/2021 INITIAL    PLAN: PT FREQUENCY:  3x/wk this week and 2x/wk x 4 weeks   PT DURATION: 4 weeks   PLANNED INTERVENTIONS: Therapeutic exercises, Therapeutic activity, Neuro Muscular re-education, Balance training, Gait training, Patient/Family education, Joint mobilization, Stair training, Electrical stimulation, Spinal mobilization, Cryotherapy, Moist heat, scar mobilization, Taping, and Manual therapy   PLAN FOR NEXT SESSION: Cont strengthening, manual therapy, and ROM per TKA protocol.  Gait training.  Ice to reduce pain and soreness      Carney Living PT DPT  06/13/2021, 9:11 AM

## 2021-06-17 ENCOUNTER — Ambulatory Visit (HOSPITAL_BASED_OUTPATIENT_CLINIC_OR_DEPARTMENT_OTHER): Payer: 59 | Admitting: Physical Therapy

## 2021-06-17 ENCOUNTER — Other Ambulatory Visit: Payer: Self-pay

## 2021-06-17 ENCOUNTER — Encounter (HOSPITAL_BASED_OUTPATIENT_CLINIC_OR_DEPARTMENT_OTHER): Payer: Self-pay | Admitting: Physical Therapy

## 2021-06-17 DIAGNOSIS — R262 Difficulty in walking, not elsewhere classified: Secondary | ICD-10-CM

## 2021-06-17 DIAGNOSIS — M6281 Muscle weakness (generalized): Secondary | ICD-10-CM | POA: Diagnosis not present

## 2021-06-17 DIAGNOSIS — M25662 Stiffness of left knee, not elsewhere classified: Secondary | ICD-10-CM

## 2021-06-17 DIAGNOSIS — M25562 Pain in left knee: Secondary | ICD-10-CM

## 2021-06-17 NOTE — Therapy (Signed)
OUTPATIENT PHYSICAL THERAPY TREATMENT NOTE   Patient Name: [name] MRN: [id number] DOB:[birth year], 55 y.o., female Today's Date: 06/17/2021  PCP: Mayra Neer, MD REFERRING PROVIDER: Mayra Neer, MD    Past Medical History:  Diagnosis Date   Acute medial meniscus tear of left knee 08/21/2020   Allergic rhinitis due to pollen 08/21/2020   Benign hypertension 08/21/2020   Genital warts 08/21/2020   Hematuria 08/21/2020   History of kidney stones    Major depression 08/21/2020   Mixed hyperlipidemia 08/21/2020   Obesity 08/21/2020   Pre-diabetes    Primary localized osteoarthritis of left knee 04/18/2021   Primary localized osteoarthritis of left knee    Radicular syndrome of left leg 04/23/2021   Scoliosis of lumbar region due to degenerative disease of spine in adult 04/23/2021   Sleep apnea    Somnolence 08/21/2020   Tobacco abuse 08/21/2020   Past Surgical History:  Procedure Laterality Date   ANKLE FRACTURE SURGERY     CESAREAN SECTION     FRACTURE SURGERY     HAND SURGERY     KNEE ARTHROSCOPY WITH MEDIAL MENISECTOMY Left 09/04/2020   Procedure: KNEE ARTHROSCOPY WITH MEDIAL MENISECTOMY;  Surgeon: Elsie Saas, MD;  Location: [facility];  Service: Orthopedics;  Laterality: Left;   ROTATOR CUFF REPAIR     TOTAL KNEE ARTHROPLASTY Left 05/06/2021   Procedure: TOTAL KNEE ARTHROPLASTY;  Surgeon: Elsie Saas, MD;  Location: [facility];  Service: Orthopedics;  Laterality: Left;   TUBAL LIGATION     Patient Active Problem List   Diagnosis Date Noted   S/P total knee arthroplasty, left 05/06/2021   Radicular syndrome of left leg 04/23/2021   Scoliosis of lumbar region due to degenerative disease of spine in adult 04/23/2021   Primary localized osteoarthritis of left knee 04/18/2021   Allergic rhinitis due to pollen 08/21/2020   Benign hypertension 08/21/2020   Genital warts 08/21/2020   Hematuria 08/21/2020   Major depression 08/21/2020    Mixed hyperlipidemia 08/21/2020   Obesity 08/21/2020   Somnolence 08/21/2020   Tobacco abuse 08/21/2020   Acute medial meniscus tear of left knee 08/21/2020     REFERRING PROVIDER: Matthew Saras, PA*; Surgeon:  Dr. Noemi Chapel   REFERRING DIAG: M17.12 (ICD-10-CM) - Primary localized osteoarthrosis of the knee, left  M54.50,M79.605 (ICD-10-CM) - Low back pain radiating to left leg  M41.50 (ICD-10-CM) - Scoliosis due to degenerative disease of spine in adult patient    THERAPY DIAG:  Pain in L knee Joint stiffness in L knee Muscle weakness Difficulty in walking Presence of artificial knee replacement   ONSET DATE: 05/06/2021 L TKA   SUBJECTIVE:    SUBJECTIVE STATEMENT:   Patient reports her knee is better but still sore. She continues to have a catch when she bends her knee.  She iss still having to do a large amount of walking at the hospital.        PERTINENT HISTORY: L3 disc bulge and scoliosis.  R RCR, L ankle surgery.   PAIN:  Are you having pain?No  Just stiffness    PRECAUTIONS: Other: Per TKA protocol.  Recent back issues   WEIGHT BEARING RESTRICTIONS WBAT     PATIENT GOALS: to be able to go for a nice long walk     OBJECTIVE:  TODAY'S TREATMENT: 12/12 - PROM into flexion: grade II and III PA and AP mobilizations. Improved pain with joint mobs.  - quad set 3x10  - SLR 3x10  - Standing  weight shifts 2x10  -Standing march 2x10  - attempted bridging but patient unable to tolerate.    Vaso: low pressure: 32 degrees in elevated position 15 min      06/13/2021 - PROM into flexion: caused increased pain, limited range compared to last visit. Manual halted  - quad set 3x10  - SLR 3x10  - Standing weight shifts 2x10  -Standing march 2x10    Vaso: low pressure: 32 degrees in elevated position 15 min                  12/2 -Reviewed response to prior Rx, current function, pain level, and HEP compliance.  -Assessed ROM and strength ROM 0-125       -Bike 5 min seat 8-9  -LAQ with 2# 2x10 -supine SLR 2x10    -Therapeutic Activities:  (to improve functional mobility and functional strength) -step ups on 6 inch step 2 x 10 reps with 1 UE assist -lateral step ups on 4 inch step 2x10 reps -mini squats to table 2x10 reps     11/28   Therapeutic Exercise: -Reviewed response to prior Rx, current function, pain level, and HEP compliance.  -Assessed ROM and strength -Pt performed"   -Bike 5 min seat 8-9  -LAQ with 2# 2x10 -supine SLR 2x10  -seated HS curls with YTB 2x10 reps -supine heel slides AAROM with strap x15 reps each      -Manual Therapy: -Grade II-III 4D patellar mobs and PA jt mobs to L knee to improve pain, ROM, and to normalize arthrokinematics -L knee flexion and extension PROM in supine per jt stiffness and pt tolerance -supine manual knee extension stretch with heel propped in PT's hand.     -Therapeutic Activities:  (to improve functional mobility and functional strength) -step ups on 6 inch step 2 x 10 reps with 1 UE assist -lateral step ups on 4 inch step 2x10 reps -mini squats to table 2x10 reps       PATIENT EDUCATION:  Education details:  objective findings, HEP, Exercise form, and POC.  Person educated: Patent  Education method: Explanation, Demonstration, Tactile cues, and Verbal cues.  Handout.  Education comprehension: verbalized understanding, returned demonstration, verbal cues required, and tactile cues required     HOME EXERCISE PROGRAM: Access Code: [email or web address] URL: [email or web address] Date: 05/24/2021 Prepared by: Ronny Flurry   Exercises Supine Quadricep Sets - 2-3 x daily - 7 x weekly - 2 sets - 10 reps - 5 seconds hold ANKLE PUMPS - 1 x daily - 7 x weekly - 3 sets - 10 reps Supine Hip Abduction AROM - 2 x daily - 7 x weekly - 2 sets - 10 reps Seated Knee Extension Stretch with Chair - 2-3 x daily - 7 x weekly - 1 sets - 2-3 reps - 2 minutes hold Supine Heel Slide - 2-3 x  daily - 7 x weekly - 1 sets - 10 reps Standing Anterior Posterior Weight Shift with Chair - 1 x daily - 7 x weekly - 3 sets - 10 reps Supine Knee Extension Strengthening - 1 x daily - 7 x weekly - 3 sets - 10 reps Supine Heel Slide with Strap - 2 x daily - 7 x weekly - 1-2 sets - 10 reps - 3-5 seconds hold Supine Active Straight Leg Raise - 1 x daily - 7 x weekly - 2 sets - 10 reps Heel Raises with Counter Support - 1 x daily - 6-7 x weekly - 2 sets -  10 reps Standing Terminal Knee Extension with Resistance - 1 x daily - 6-7 x weekly - 2 sets - 10 reps         ASSESSMENT:   CLINICAL IMPRESSION: Patient tolerated manual therapy and exercises better then last visit. Her knee feels like it it is inflamed right now. It has some heat to it. She had improved pain with joint mobilization. She responded well tot he vaso the last time so therapy performed again    Objective impairments include Abnormal gait, decreased activity tolerance, decreased balance, decreased endurance, decreased mobility, difficulty walking, decreased ROM, decreased strength, hypomobility, increased edema, impaired flexibility, and pain. These impairments are limiting patient from cleaning, community activity, driving, meal prep, occupation, laundry, and shopping. Personal factors including  current back pain  are also affecting patient's functional outcome.       GOALS:     SHORT TERM GOALS:   STG Name Target Date Goal status  1 Pt will be independent and compliant with HEP for improved ROM, pain, strength, and function. Baseline:  05/29/2021 GOAL MET  2 Pt will demo a good quad set and perform supine SLR with no > than minimal quad lag for improved strength and stability with gait.  Baseline:  11/23/20222 GOAL MET  3 Pt will demo improved L knee AROM/PROM to 0 - 90/95 deg for improved mobility and stiffness Baseline: 06/05/2021 PARTIALLY MET  4 Pt will demo improved TKE, toe off, and Wb'ing thru L LE with  gait. Baseline: 05/29/2021 GOAL MET  5 Pt will ambulate in community with [place] with good stability without significant pain Baseline: 05/29/2021 GOAL MET                      LONG TERM GOALS:    LTG Name Target Date Goal status  1 Pt will demo L knee AROM to be 0 - 115 deg for improved stiffness, mobility, and performance of stairs. Baseline: 07/03/2021 PARTIALLY MET  2 Pt will ambulate extended community distance without significant pain or difficulty.  Baseline: 07/03/2021 INITIAL  3 Pt will ascend and descend stairs with a reciprocal gait with 1 rail without significant pain.  Baseline: 07/03/2021 INITIAL  4 Pt will demo improved L hip flexion strength to 5/5, hip abd strength to 4/5 MMT, and knee flex/ext strength to 5/5 MMT for improved tolerance to functional mobility and daily activities.  Baseline: 07/03/2021 INITIAL  5 Pt will be able to perform her normal daily transfers without significant difficulty.  Baseline: 07/03/2021 INITIAL  6 Pt will ambulate with a normalized heel to toe gait pattern without limping.  Baseline: 06/20/2021 INITIAL  7 Pt will be able to perform her ADLs and IADLs without significant lumbar pain or  07/03/2021 INITIAL    PLAN: PT FREQUENCY:  3x/wk this week and 2x/wk x 4 weeks   PT DURATION: 4 weeks   PLANNED INTERVENTIONS: Therapeutic exercises, Therapeutic activity, Neuro Muscular re-education, Balance training, Gait training, Patient/Family education, Joint mobilization, Stair training, Electrical stimulation, Spinal mobilization, Cryotherapy, Moist heat, scar mobilization, Taping, and Manual therapy   PLAN FOR NEXT SESSION: Cont strengthening, manual therapy, and ROM per TKA protocol.  Gait training.  Ice to reduce pain and soreness     Carney Living PT DPT  06/17/2021, 1:56 PM

## 2021-06-20 ENCOUNTER — Encounter (HOSPITAL_BASED_OUTPATIENT_CLINIC_OR_DEPARTMENT_OTHER): Payer: 59 | Admitting: Physical Therapy

## 2021-06-28 ENCOUNTER — Ambulatory Visit (HOSPITAL_BASED_OUTPATIENT_CLINIC_OR_DEPARTMENT_OTHER): Payer: 59 | Admitting: Physical Therapy

## 2021-06-28 ENCOUNTER — Other Ambulatory Visit (HOSPITAL_COMMUNITY): Payer: Self-pay

## 2021-06-28 DIAGNOSIS — J218 Acute bronchiolitis due to other specified organisms: Secondary | ICD-10-CM | POA: Diagnosis not present

## 2021-06-28 MED ORDER — DOXYCYCLINE HYCLATE 100 MG PO TABS
ORAL_TABLET | ORAL | 0 refills | Status: AC
  Filled 2021-06-28: qty 10, 5d supply, fill #0

## 2021-06-28 MED ORDER — BENZONATATE 100 MG PO CAPS
ORAL_CAPSULE | ORAL | 0 refills | Status: AC
  Filled 2021-06-28: qty 30, 10d supply, fill #0

## 2021-06-28 MED ORDER — ALBUTEROL SULFATE HFA 108 (90 BASE) MCG/ACT IN AERS
INHALATION_SPRAY | RESPIRATORY_TRACT | 1 refills | Status: AC
  Filled 2021-06-28: qty 18, 30d supply, fill #0

## 2021-07-03 ENCOUNTER — Other Ambulatory Visit (HOSPITAL_COMMUNITY): Payer: Self-pay

## 2021-07-03 DIAGNOSIS — R0602 Shortness of breath: Secondary | ICD-10-CM | POA: Diagnosis not present

## 2021-07-03 DIAGNOSIS — R059 Cough, unspecified: Secondary | ICD-10-CM | POA: Diagnosis not present

## 2021-07-03 MED ORDER — PREDNISONE 20 MG PO TABS
ORAL_TABLET | ORAL | 0 refills | Status: DC
  Filled 2021-07-03: qty 12, 6d supply, fill #0

## 2021-07-03 MED ORDER — HYDROCODONE BIT-HOMATROP MBR 5-1.5 MG/5ML PO SOLN
ORAL | 0 refills | Status: DC
  Filled 2021-07-03: qty 100, 20d supply, fill #0

## 2021-07-11 ENCOUNTER — Ambulatory Visit (HOSPITAL_BASED_OUTPATIENT_CLINIC_OR_DEPARTMENT_OTHER): Payer: 59 | Attending: Physician Assistant | Admitting: Physical Therapy

## 2021-07-11 ENCOUNTER — Other Ambulatory Visit: Payer: Self-pay

## 2021-07-11 ENCOUNTER — Encounter (HOSPITAL_BASED_OUTPATIENT_CLINIC_OR_DEPARTMENT_OTHER): Payer: Self-pay | Admitting: Physical Therapy

## 2021-07-11 DIAGNOSIS — M25662 Stiffness of left knee, not elsewhere classified: Secondary | ICD-10-CM | POA: Diagnosis not present

## 2021-07-11 DIAGNOSIS — M545 Low back pain, unspecified: Secondary | ICD-10-CM | POA: Diagnosis not present

## 2021-07-11 DIAGNOSIS — M25562 Pain in left knee: Secondary | ICD-10-CM | POA: Diagnosis not present

## 2021-07-11 DIAGNOSIS — M6281 Muscle weakness (generalized): Secondary | ICD-10-CM | POA: Diagnosis not present

## 2021-07-11 DIAGNOSIS — R262 Difficulty in walking, not elsewhere classified: Secondary | ICD-10-CM | POA: Diagnosis not present

## 2021-07-11 DIAGNOSIS — G8929 Other chronic pain: Secondary | ICD-10-CM | POA: Diagnosis not present

## 2021-07-11 NOTE — Therapy (Signed)
OUTPATIENT PHYSICAL THERAPY TREATMENT NOTE   Patient Name: [name] MRN: [id number] DOB:[birth year], 56 y.o., female Today's Date: 07/12/2021  PCP: Mayra Neer, MD REFERRING PROVIDER: Mayra Neer, MD   PT End of Session - 07/12/21 1050     Visit Number 1    Number of Visits 6    Date for PT Re-Evaluation 08/23/21    PT Start Time 0845    PT Stop Time 0928    PT Time Calculation (min) 43 min    Activity Tolerance Patient tolerated treatment well    Behavior During Therapy [facility] for tasks assessed/performed             Past Medical History:  Diagnosis Date   Acute medial meniscus tear of left knee 08/21/2020   Allergic rhinitis due to pollen 08/21/2020   Benign hypertension 08/21/2020   Genital warts 08/21/2020   Hematuria 08/21/2020   History of kidney stones    Major depression 08/21/2020   Mixed hyperlipidemia 08/21/2020   Obesity 08/21/2020   Pre-diabetes    Primary localized osteoarthritis of left knee 04/18/2021   Primary localized osteoarthritis of left knee    Radicular syndrome of left leg 04/23/2021   Scoliosis of lumbar region due to degenerative disease of spine in adult 04/23/2021   Sleep apnea    Somnolence 08/21/2020   Tobacco abuse 08/21/2020   Past Surgical History:  Procedure Laterality Date   ANKLE FRACTURE SURGERY     CESAREAN SECTION     FRACTURE SURGERY     HAND SURGERY     KNEE ARTHROSCOPY WITH MEDIAL MENISECTOMY Left 09/04/2020   Procedure: KNEE ARTHROSCOPY WITH MEDIAL MENISECTOMY;  Surgeon: Elsie Saas, MD;  Location: [facility];  Service: Orthopedics;  Laterality: Left;   ROTATOR CUFF REPAIR     TOTAL KNEE ARTHROPLASTY Left 05/06/2021   Procedure: TOTAL KNEE ARTHROPLASTY;  Surgeon: Elsie Saas, MD;  Location: [facility];  Service: Orthopedics;  Laterality: Left;   TUBAL LIGATION     Patient Active Problem List   Diagnosis Date Noted   S/P total knee arthroplasty, left 05/06/2021   Radicular syndrome of left  leg 04/23/2021   Scoliosis of lumbar region due to degenerative disease of spine in adult 04/23/2021   Primary localized osteoarthritis of left knee 04/18/2021   Allergic rhinitis due to pollen 08/21/2020   Benign hypertension 08/21/2020   Genital warts 08/21/2020   Hematuria 08/21/2020   Major depression 08/21/2020   Mixed hyperlipidemia 08/21/2020   Obesity 08/21/2020   Somnolence 08/21/2020   Tobacco abuse 08/21/2020   Acute medial meniscus tear of left knee 08/21/2020   REFERRING PROVIDER: Matthew Saras, PA*; Surgeon:  Dr. Marquette Saa DIAG: M17.12 (ICD-10-CM) - Primary localized osteoarthrosis of the knee, left  M54.50,M79.605 (ICD-10-CM) - Low back pain radiating to left leg  M41.50 (ICD-10-CM) - Scoliosis due to degenerative disease of spine in adult patient    THERAPY DIAG:  Pain in L knee Joint stiffness in L knee Muscle weakness Difficulty in walking Presence of artificial knee replacement   ONSET DATE: 05/06/2021 L TKA   SUBJECTIVE:    SUBJECTIVE STATEMENT:   Paitnes knee has improved. She is now having pain in her lower back through. The pain is mostly on her left side. Her knee is doing much better. She has had an acute onset of lwer back pain,.       PERTINENT HISTORY: L3 disc bulge and scoliosis.  R RCR, L ankle surgery.  PAIN:  Are you having pain?No  Just stiffness    PRECAUTIONS: Other: Per TKA protocol.  Recent back issues   WEIGHT BEARING RESTRICTIONS WBAT     PATIENT GOALS: to be able to go for a nice long walk     OBJECTIVE:    PATIENT SURVEYS:  FOTO 26   COGNITION:          Overall cognitive status: Within functional limits for tasks assessed                        GIRTH MEASUREMENT: (cm) Mid patella:  R: 39.1, L: 44.0     OBSERVATION: Pt wearing compression stocking and has aquacell dressing over incision.  A couple of tiny small spots of drainage at proximal bandage, nothing abnormal.  Pt has some increased  redness/pink along medial mid aquacell   PALPATION:  TTP at lateral L knee.  No significant tenderness in medial knee.  none in calf.        LE AROM/PROM:   A/PROM Right 05/09/2021 Left 05/09/2021  Hip flexion      Hip extension      Hip abduction      Hip adduction      Hip internal rotation      Hip external rotation      Knee flexion 130 56 deg passive  Knee extension WNL with hyperextension 6/2  Ankle dorsiflexion      Ankle plantarflexion      Ankle inversion      Ankle eversion       (Blank rows = not tested)  Lumbar motion: full fleixon  Mild pain with extension Mild pain on the left with right side bending    LE MMT:   MMT Right 05/09/2021 Left 05/09/2021  Hip flexion 30.6  15.7  Hip extension      Hip abduction 35.6  40.4   Hip adduction      Hip internal rotation      Hip external rotation      Knee flexion      Knee extension 67.9 63.2  Ankle dorsiflexion      Ankle plantarflexion      Ankle inversion      Ankle eversion       (Blank rows = not tested)       GAIT: Not using a device. Over the past week increased low back pain while ambulating         TODAY'S TREATMENT: - 1/5  Re-assessed back - reviewed stretches for lower back  - LTR x20  - Piriformis stretch 3x20 sec hold left   - hamstring stretch  3x20 sec hold  Manual therapy - trigger point release; LAD to right LE Grade III and IV    12/12 - PROM into flexion: grade II and III PA and AP mobilizations. Improved pain with joint mobs.  - quad set 3x10  - SLR 3x10  - Standing weight shifts 2x10  -Standing march 2x10  - attempted bridging but patient unable to tolerate.    Vaso: low pressure: 32 degrees in elevated position 15 min          06/13/2021 - PROM into flexion: caused increased pain, limited range compared to last visit. Manual halted  - quad set 3x10  - SLR 3x10  - Standing weight shifts 2x10  -Standing march 2x10    Vaso: low pressure: 32 degrees in elevated  position 15 min  12/2 -Reviewed response to prior Rx, current function, pain level, and HEP compliance.  -Assessed ROM and strength ROM 0-125      -Bike 5 min seat 8-9  -LAQ with 2# 2x10 -supine SLR 2x10    -Therapeutic Activities:  (to improve functional mobility and functional strength) -step ups on 6 inch step 2 x 10 reps with 1 UE assist -lateral step ups on 4 inch step 2x10 reps -mini squats to table 2x10 reps     11/28   Therapeutic Exercise: -Reviewed response to prior Rx, current function, pain level, and HEP compliance.  -Assessed ROM and strength -Pt performed"   -Bike 5 min seat 8-9  -LAQ with 2# 2x10 -supine SLR 2x10  -seated HS curls with YTB 2x10 reps -supine heel slides AAROM with strap x15 reps each      -Manual Therapy: -Grade II-III 4D patellar mobs and PA jt mobs to L knee to improve pain, ROM, and to normalize arthrokinematics -L knee flexion and extension PROM in supine per jt stiffness and pt tolerance -supine manual knee extension stretch with heel propped in PT's hand.     -Therapeutic Activities:  (to improve functional mobility and functional strength) -step ups on 6 inch step 2 x 10 reps with 1 UE assist -lateral step ups on 4 inch step 2x10 reps -mini squats to table 2x10 reps       PATIENT EDUCATION:  Education details:  objective findings, HEP, Exercise form, and POC.  Person educated: Patent  Education method: Explanation, Demonstration, Tactile cues, and Verbal cues.  Handout.  Education comprehension: verbalized understanding, returned demonstration, verbal cues required, and tactile cues required     HOME EXERCISE PROGRAM: Access Code: [email or web address] URL: [email or web address] Date: 05/24/2021 Prepared by: Ronny Flurry   Exercises Supine Quadricep Sets - 2-3 x daily - 7 x weekly - 2 sets - 10 reps - 5 seconds hold ANKLE PUMPS - 1 x daily - 7 x weekly - 3 sets - 10 reps Supine Hip Abduction AROM  - 2 x daily - 7 x weekly - 2 sets - 10 reps Seated Knee Extension Stretch with Chair - 2-3 x daily - 7 x weekly - 1 sets - 2-3 reps - 2 minutes hold Supine Heel Slide - 2-3 x daily - 7 x weekly - 1 sets - 10 reps Standing Anterior Posterior Weight Shift with Chair - 1 x daily - 7 x weekly - 3 sets - 10 reps Supine Knee Extension Strengthening - 1 x daily - 7 x weekly - 3 sets - 10 reps Supine Heel Slide with Strap - 2 x daily - 7 x weekly - 1-2 sets - 10 reps - 3-5 seconds hold Supine Active Straight Leg Raise - 1 x daily - 7 x weekly - 2 sets - 10 reps Heel Raises with Counter Support - 1 x daily - 6-7 x weekly - 2 sets - 10 reps Standing Terminal Knee Extension with Resistance - 1 x daily - 6-7 x weekly - 2 sets - 10 reps         ASSESSMENT:   CLINICAL IMPRESSION: Patients knee pain has resolved. Her main problem at this time is her back. Her initial prescription was for her knee and back. We only assessed the knee with the hope the back would resolve as well. We will discharge her knee at this time. Her back appears to be more muscular. She has good motion and strength. We reviewed a soft tissue mobilization and  stretching program. We will continue 1W6 to work on her back.   Objective impairments include Abnormal gait, decreased activity tolerance, decreased balance, decreased endurance, decreased mobility, difficulty walking, decreased ROM, decreased strength, hypomobility, increased edema, impaired flexibility, and pain. These impairments are limiting patient from cleaning, community activity, driving, meal prep, occupation, laundry, and shopping. Personal factors including  current back pain  are also affecting patient's functional outcome.       GOALS:     SHORT TERM GOALS:   STG Name Target Date Goal status  1 Pt will be independent and compliant with HEP for improved ROM, pain, strength, and function. Baseline:  05/29/2021 GOAL MET  2 Pt will demo a good quad set and perform  supine SLR with no > than minimal quad lag for improved strength and stability with gait.  Baseline:  11/23/20222 GOAL MET  3 Pt will demo improved L knee AROM/PROM to 0 - 90/95 deg for improved mobility and stiffness Baseline: 06/05/2021 0-117 Met   4 Pt will demo improved TKE, toe off, and Wb'ing thru L LE with gait. Baseline: 05/29/2021 GOAL MET  5 Pt will ambulate in community with [place] with good stability without significant pain Baseline: 05/29/2021 GOAL MET     Patient will be independent with low back soft tissue mobilization   07/25/2020  New              LONG TERM GOALS:    LTG Name Target Date Goal status Checked on assessed on1/6  1 Pt will demo L knee AROM to be 0 - 115 deg for improved stiffness, mobility, and performance of stairs. Baseline: 07/03/2021  MET  2 Pt will ambulate extended community distance without significant pain or difficulty.  Baseline: 08/23/2021 Pain with lower back ongoing   3 Pt will ascend and descend stairs with a reciprocal gait with 1 rail without significant pain.  Baseline: 07/03/2021 No difficulty upstairs Low back pain going down Ongoing   4 Pt will demo improved L hip flexion strength to 5/5, hip abd strength to 4/5 MMT, and knee flex/ext strength to 5/5 MMT for improved tolerance to functional mobility and daily activities.  Baseline: 07/03/2021 5/5  5 Pt will be able to perform her normal daily transfers without significant difficulty.  Baseline: 07/03/2021 Performing independently   6 Pt will ambulate with a normalized heel to toe gait pattern without limping.  Baseline: 06/20/2021 No limp noted  Achieved  7 Pt will be able to perform her ADLs and IADLs without significant lumbar pain or  07/03/2021 Ongoing     PLAN: PT FREQUENCY:  1x   PT DURATION: 6 weeks    PLANNED INTERVENTIONS: Therapeutic exercises, Therapeutic activity, Neuro Muscular re-education, Balance training, Gait training, Patient/Family education, Joint mobilization,  Stair training, Electrical stimulation, Spinal mobilization, Cryotherapy, Moist heat, scar mobilization, Taping, and Manual therapy   PLAN FOR NEXT SESSION:  consider needling; continue with manual therapy. Work on [employer], [employer].       Carney Living PT DPT  07/12/2021, 12:43 PM

## 2021-07-12 DIAGNOSIS — M7071 Other bursitis of hip, right hip: Secondary | ICD-10-CM | POA: Diagnosis not present

## 2021-07-17 ENCOUNTER — Other Ambulatory Visit: Payer: Self-pay

## 2021-07-17 ENCOUNTER — Ambulatory Visit (HOSPITAL_BASED_OUTPATIENT_CLINIC_OR_DEPARTMENT_OTHER): Payer: 59 | Admitting: Physical Therapy

## 2021-07-17 DIAGNOSIS — R262 Difficulty in walking, not elsewhere classified: Secondary | ICD-10-CM

## 2021-07-17 DIAGNOSIS — M25662 Stiffness of left knee, not elsewhere classified: Secondary | ICD-10-CM | POA: Diagnosis not present

## 2021-07-17 DIAGNOSIS — M545 Low back pain, unspecified: Secondary | ICD-10-CM

## 2021-07-17 DIAGNOSIS — G8929 Other chronic pain: Secondary | ICD-10-CM | POA: Diagnosis not present

## 2021-07-17 DIAGNOSIS — M25562 Pain in left knee: Secondary | ICD-10-CM

## 2021-07-17 DIAGNOSIS — M6281 Muscle weakness (generalized): Secondary | ICD-10-CM | POA: Diagnosis not present

## 2021-07-18 ENCOUNTER — Encounter (HOSPITAL_BASED_OUTPATIENT_CLINIC_OR_DEPARTMENT_OTHER): Payer: Self-pay | Admitting: Physical Therapy

## 2021-07-18 NOTE — Therapy (Signed)
OUTPATIENT PHYSICAL THERAPY TREATMENT NOTE   Patient Name: [name] MRN: [id number] DOB:21-Mar-1966, 56 y.o., female Today's Date: 07/18/2021  PCP: Mayra Neer, MD REFERRING PROVIDER: Mayra Neer, MD   PT End of Session - 07/18/21 [phone]     Visit Number 2    Number of Visits 6    Date for PT Re-Evaluation 08/23/21    PT Start Time 0845    PT Stop Time 0926    PT Time Calculation (min) 41 min    Activity Tolerance Patient tolerated treatment well    Behavior During Therapy [facility] for tasks assessed/performed             Past Medical History:  Diagnosis Date   Acute medial meniscus tear of left knee 08/21/2020   Allergic rhinitis due to pollen 08/21/2020   Benign hypertension 08/21/2020   Genital warts 08/21/2020   Hematuria 08/21/2020   History of kidney stones    Major depression 08/21/2020   Mixed hyperlipidemia 08/21/2020   Obesity 08/21/2020   Pre-diabetes    Primary localized osteoarthritis of left knee 04/18/2021   Primary localized osteoarthritis of left knee    Radicular syndrome of left leg 04/23/2021   Scoliosis of lumbar region due to degenerative disease of spine in adult 04/23/2021   Sleep apnea    Somnolence 08/21/2020   Tobacco abuse 08/21/2020   Past Surgical History:  Procedure Laterality Date   ANKLE FRACTURE SURGERY     CESAREAN SECTION     FRACTURE SURGERY     HAND SURGERY     KNEE ARTHROSCOPY WITH MEDIAL MENISECTOMY Left 09/04/2020   Procedure: KNEE ARTHROSCOPY WITH MEDIAL MENISECTOMY;  Surgeon: Elsie Saas, MD;  Location: [place];  Service: Orthopedics;  Laterality: Left;   ROTATOR CUFF REPAIR     TOTAL KNEE ARTHROPLASTY Left 05/06/2021   Procedure: TOTAL KNEE ARTHROPLASTY;  Surgeon: Elsie Saas, MD;  Location: [facility];  Service: Orthopedics;  Laterality: Left;   TUBAL LIGATION     Patient Active Problem List   Diagnosis Date Noted   S/P total knee arthroplasty, left 05/06/2021   Radicular syndrome of  left leg 04/23/2021   Scoliosis of lumbar region due to degenerative disease of spine in adult 04/23/2021   Primary localized osteoarthritis of left knee 04/18/2021   Allergic rhinitis due to pollen 08/21/2020   Benign hypertension 08/21/2020   Genital warts 08/21/2020   Hematuria 08/21/2020   Major depression 08/21/2020   Mixed hyperlipidemia 08/21/2020   Obesity 08/21/2020   Somnolence 08/21/2020   Tobacco abuse 08/21/2020   Acute medial meniscus tear of left knee 08/21/2020   REFERRING PROVIDER: Matthew Saras, PA*; Surgeon:  Dr. Noemi Chapel   REFERRING DIAG: M17.12 (ICD-10-CM) - Primary localized osteoarthrosis of the knee, left  M54.50,M79.605 (ICD-10-CM) - Low back pain radiating to left leg  M41.50 (ICD-10-CM) - Scoliosis due to degenerative disease of spine in adult patient    THERAPY DIAG:  Pain in L knee Joint stiffness in L knee Muscle weakness Difficulty in walking Presence of artificial knee replacement   ONSET DATE: 05/06/2021 L TKA   SUBJECTIVE:    SUBJECTIVE STATEMENT:  Patient reports her back is about the same, or maybe a little better. She has been working on her stretches. She has been doing 4 hours at work, but they have been stressfull hours.   PERTINENT HISTORY: L3 disc bulge and scoliosis.  R RCR, L ankle surgery.   PAIN:  PAIN:  Are you having pain?  Yes VAS scale: 5/10 Pain location: left lower back  Pain orientation: Left  PAIN TYPE: aching Pain description: constant  Aggravating factors: standing, walking , bending  Relieving factors: rest  PRECAUTIONS: Other: Per TKA protocol.  Recent back issues   WEIGHT BEARING RESTRICTIONS WBAT     PATIENT GOALS: to be able to go for a nice long walk     OBJECTIVE:       TODAY'S TREATMENT: 1/12 Trigger Point Dry-Needling  Treatment instructions: Expect mild to moderate muscle soreness. S/S of pneumothorax if dry needled over a lung field, and to seek immediate medical attention should they  occur. Patient verbalized understanding of these instructions and education.  Patient Consent Given: Yes Education handout provided: Yes Muscles treated: left lower lumbar multifidi and left lower gluteal using a .30x50 needle  Electrical stimulation performed: No Parameters: N/A Treatment response/outcome: Twitch response elicited and Palpable decrease in muscle tension   Manual: trigger point release to left lower lumbar spine and left gluteal; LAD bilateral grade II and III oscillations   Supine exercises:  Piriformis stretch 3x20 sec hold  LTR x20  Bridge x20   Seated:  Clamshells x20 red   Nu-step 5 min     - 1/5  Re-assessed back - reviewed stretches for lower back  - LTR x20  - Piriformis stretch 3x20 sec hold left   - hamstring stretch  3x20 sec hold  Manual therapy - trigger point release; LAD to right LE Grade III and IV    12/12 - PROM into flexion: grade II and III PA and AP mobilizations. Improved pain with joint mobs.  - quad set 3x10  - SLR 3x10  - Standing weight shifts 2x10  -Standing march 2x10  - attempted bridging but patient unable to tolerate.    Vaso: low pressure: 32 degrees in elevated position 15 min            PATIENT EDUCATION:  Education details:  objective findings, HEP, Exercise form, and POC.  Person educated: Patent  Education method: Explanation, Demonstration, Tactile cues, and Verbal cues.  Handout.  Education comprehension: verbalized understanding, returned demonstration, verbal cues required, and tactile cues required     HOME EXERCISE PROGRAM: Access Code: [email or web address] URL: [email or web address] Date: 05/24/2021 Prepared by: Ronny Flurry   Exercises Supine Quadricep Sets - 2-3 x daily - 7 x weekly - 2 sets - 10 reps - 5 seconds hold ANKLE PUMPS - 1 x daily - 7 x weekly - 3 sets - 10 reps Supine Hip Abduction AROM - 2 x daily - 7 x weekly - 2 sets - 10 reps Seated Knee Extension Stretch with Chair - 2-3  x daily - 7 x weekly - 1 sets - 2-3 reps - 2 minutes hold Supine Heel Slide - 2-3 x daily - 7 x weekly - 1 sets - 10 reps Standing Anterior Posterior Weight Shift with Chair - 1 x daily - 7 x weekly - 3 sets - 10 reps Supine Knee Extension Strengthening - 1 x daily - 7 x weekly - 3 sets - 10 reps Supine Heel Slide with Strap - 2 x daily - 7 x weekly - 1-2 sets - 10 reps - 3-5 seconds hold Supine Active Straight Leg Raise - 1 x daily - 7 x weekly - 2 sets - 10 reps Heel Raises with Counter Support - 1 x daily - 6-7 x weekly - 2 sets - 10 reps Standing Terminal Knee Extension with Resistance - 1  x daily - 6-7 x weekly - 2 sets - 10 reps         ASSESSMENT:   CLINICAL IMPRESSION: Patients knee pain has resolved. Her main problem at this time is her back. Her initial prescription was for her knee and back. We only assessed the knee with the hope the back would resolve as well. We will discharge her knee at this time. Her back appears to be more muscular. She has good motion and strength. We reviewed a soft tissue mobilization and stretching program. We will continue 1W6 to work on her back.   Objective impairments include Abnormal gait, decreased activity tolerance, decreased balance, decreased endurance, decreased mobility, difficulty walking, decreased ROM, decreased strength, hypomobility, increased edema, impaired flexibility, and pain. These impairments are limiting patient from cleaning, community activity, driving, meal prep, occupation, laundry, and shopping. Personal factors including  current back pain  are also affecting patient's functional outcome.       GOALS:     SHORT TERM GOALS:   STG Name Target Date Goal status  1 Pt will be independent and compliant with HEP for improved ROM, pain, strength, and function. Baseline:  05/29/2021 GOAL MET  2 Pt will demo a good quad set and perform supine SLR with no > than minimal quad lag for improved strength and stability with gait.   Baseline:  11/23/20222 GOAL MET  3 Pt will demo improved L knee AROM/PROM to 0 - 90/95 deg for improved mobility and stiffness Baseline: 06/05/2021 0-117 Met   4 Pt will demo improved TKE, toe off, and Wb'ing thru L LE with gait. Baseline: 05/29/2021 GOAL MET  5 Pt will ambulate in community with [place] with good stability without significant pain Baseline: 05/29/2021 GOAL MET     Patient will be independent with low back soft tissue mobilization   07/25/2020  New              LONG TERM GOALS:    LTG Name Target Date Goal status Checked on assessed on1/6  1 Pt will demo L knee AROM to be 0 - 115 deg for improved stiffness, mobility, and performance of stairs. Baseline: 07/03/2021  MET  2 Pt will ambulate extended community distance without significant pain or difficulty.  Baseline: 08/23/2021 Pain with lower back ongoing   3 Pt will ascend and descend stairs with a reciprocal gait with 1 rail without significant pain.  Baseline: 07/03/2021 No difficulty upstairs Low back pain going down Ongoing   4 Pt will demo improved L hip flexion strength to 5/5, hip abd strength to 4/5 MMT, and knee flex/ext strength to 5/5 MMT for improved tolerance to functional mobility and daily activities.  Baseline: 07/03/2021 5/5  5 Pt will be able to perform her normal daily transfers without significant difficulty.  Baseline: 07/03/2021 Performing independently   6 Pt will ambulate with a normalized heel to toe gait pattern without limping.  Baseline: 06/20/2021 No limp noted  Achieved  7 Pt will be able to perform her ADLs and IADLs without significant lumbar pain or  07/03/2021 Ongoing     PLAN: PT FREQUENCY:  1x   PT DURATION: 6 weeks    PLANNED INTERVENTIONS: Therapeutic exercises, Therapeutic activity, Neuro Muscular re-education, Balance training, Gait training, Patient/Family education, Joint mobilization, Stair training, Electrical stimulation, Spinal mobilization, Cryotherapy, Moist heat,  scar mobilization, Taping, and Manual therapy   PLAN FOR NEXT SESSION:  consider needling; continue with manual therapy. Work on Copywriter, advertising.  Carney Living PT DPT  07/18/2021, 6:26 AM

## 2021-07-19 ENCOUNTER — Encounter (HOSPITAL_BASED_OUTPATIENT_CLINIC_OR_DEPARTMENT_OTHER): Payer: Self-pay | Admitting: Physical Therapy

## 2021-07-22 DIAGNOSIS — M1712 Unilateral primary osteoarthritis, left knee: Secondary | ICD-10-CM | POA: Diagnosis not present

## 2021-07-25 ENCOUNTER — Ambulatory Visit (HOSPITAL_BASED_OUTPATIENT_CLINIC_OR_DEPARTMENT_OTHER): Payer: 59 | Admitting: Physical Therapy

## 2021-07-25 ENCOUNTER — Encounter (HOSPITAL_BASED_OUTPATIENT_CLINIC_OR_DEPARTMENT_OTHER): Payer: Self-pay | Admitting: Physical Therapy

## 2021-07-25 ENCOUNTER — Other Ambulatory Visit: Payer: Self-pay

## 2021-07-25 DIAGNOSIS — M25562 Pain in left knee: Secondary | ICD-10-CM | POA: Diagnosis not present

## 2021-07-25 DIAGNOSIS — M25662 Stiffness of left knee, not elsewhere classified: Secondary | ICD-10-CM | POA: Diagnosis not present

## 2021-07-25 DIAGNOSIS — M545 Low back pain, unspecified: Secondary | ICD-10-CM

## 2021-07-25 DIAGNOSIS — R262 Difficulty in walking, not elsewhere classified: Secondary | ICD-10-CM | POA: Diagnosis not present

## 2021-07-25 DIAGNOSIS — M6281 Muscle weakness (generalized): Secondary | ICD-10-CM | POA: Diagnosis not present

## 2021-07-25 DIAGNOSIS — G8929 Other chronic pain: Secondary | ICD-10-CM | POA: Diagnosis not present

## 2021-07-25 NOTE — Therapy (Signed)
OUTPATIENT PHYSICAL THERAPY TREATMENT NOTE   Patient Name: [name] MRN: [id number] DOB:1966-06-05, 56 y.o., female Today's Date: 07/25/2021  PCP: Mayra Neer, MD REFERRING PROVIDER: Mayra Neer, MD   PT End of Session - 07/25/21 0912     Visit Number 3    Number of Visits 6    Date for PT Re-Evaluation 08/23/21    PT Start Time 0845    PT Stop Time 0928    PT Time Calculation (min) 43 min    Activity Tolerance Patient tolerated treatment well    Behavior During Therapy [facility] for tasks assessed/performed             Past Medical History:  Diagnosis Date   Acute medial meniscus tear of left knee 08/21/2020   Allergic rhinitis due to pollen 08/21/2020   Benign hypertension 08/21/2020   Genital warts 08/21/2020   Hematuria 08/21/2020   History of kidney stones    Major depression 08/21/2020   Mixed hyperlipidemia 08/21/2020   Obesity 08/21/2020   Pre-diabetes    Primary localized osteoarthritis of left knee 04/18/2021   Primary localized osteoarthritis of left knee    Radicular syndrome of left leg 04/23/2021   Scoliosis of lumbar region due to degenerative disease of spine in adult 04/23/2021   Sleep apnea    Somnolence 08/21/2020   Tobacco abuse 08/21/2020   Past Surgical History:  Procedure Laterality Date   ANKLE FRACTURE SURGERY     CESAREAN SECTION     FRACTURE SURGERY     HAND SURGERY     KNEE ARTHROSCOPY WITH MEDIAL MENISECTOMY Left 09/04/2020   Procedure: KNEE ARTHROSCOPY WITH MEDIAL MENISECTOMY;  Surgeon: Elsie Saas, MD;  Location: [place];  Service: Orthopedics;  Laterality: Left;   ROTATOR CUFF REPAIR     TOTAL KNEE ARTHROPLASTY Left 05/06/2021   Procedure: TOTAL KNEE ARTHROPLASTY;  Surgeon: Elsie Saas, MD;  Location: [facility];  Service: Orthopedics;  Laterality: Left;   TUBAL LIGATION     Patient Active Problem List   Diagnosis Date Noted   S/P total knee arthroplasty, left 05/06/2021   Radicular syndrome of  left leg 04/23/2021   Scoliosis of lumbar region due to degenerative disease of spine in adult 04/23/2021   Primary localized osteoarthritis of left knee 04/18/2021   Allergic rhinitis due to pollen 08/21/2020   Benign hypertension 08/21/2020   Genital warts 08/21/2020   Hematuria 08/21/2020   Major depression 08/21/2020   Mixed hyperlipidemia 08/21/2020   Obesity 08/21/2020   Somnolence 08/21/2020   Tobacco abuse 08/21/2020   Acute medial meniscus tear of left knee 08/21/2020   REFERRING PROVIDER: Matthew Saras, PA*; Surgeon:  Dr. Noemi Chapel   REFERRING DIAG: M17.12 (ICD-10-CM) - Primary localized osteoarthrosis of the knee, left  M54.50,M79.605 (ICD-10-CM) - Low back pain radiating to left leg  M41.50 (ICD-10-CM) - Scoliosis due to degenerative disease of spine in adult patient    THERAPY DIAG:  Pain in L knee Joint stiffness in L knee Muscle weakness Difficulty in walking Presence of artificial knee replacement   ONSET DATE: 05/06/2021 L TKA   SUBJECTIVE:    SUBJECTIVE STATEMENT:  The patient reports her back is still tight. She becomes more tight wen she is at work and on her feet for a long period of time. She also has increased tightness driving.  PERTINENT HISTORY: L3 disc bulge and scoliosis.  R RCR, L ankle surgery.   PAIN:  PAIN:  Are you having pain? Yes VAS  scale: 3-4/10 Pain location: left lower back  Pain orientation: Left  PAIN TYPE: aching/ tightness  Pain description: constant  Aggravating factors: standing, walking , bending  Relieving factors: rest  PRECAUTIONS: Other: Per TKA protocol.  Recent back issues   WEIGHT BEARING RESTRICTIONS WBAT     PATIENT GOALS: to be able to go for a nice long walk     OBJECTIVE:       TODAY'S TREATMENT: 1/19 Trigger Point Dry-Needling  Treatment instructions: Expect mild to moderate muscle soreness. S/S of pneumothorax if dry needled over a lung field, and to seek immediate medical attention should  they occur. Patient verbalized understanding of these instructions and education.  Patient Consent Given: Yes Education handout provided: Yes Muscles treated: left lower lumbar multifidi and left lower gluteal using a .30x50  and a .30x75 in the glutealneedle 2x. 2 spots in right lower lumbar paraspinal  Electrical stimulation performed: No Parameters: N/A Treatment response/outcome: Twitch response elicited and Palpable decrease in muscle tension   Manual: trigger point release to left lower lumbar spine and left gluteal; LAD bilateral grade II and III oscillations   Supine exercises:  Piriformis stretch 3x20 sec hold  LTR x20  Bridge x20  SLR x20   Rows 2x15 red  Shoulder extension 2x10 red    1/12 Trigger Point Dry-Needling  Treatment instructions: Expect mild to moderate muscle soreness. S/S of pneumothorax if dry needled over a lung field, and to seek immediate medical attention should they occur. Patient verbalized understanding of these instructions and education.  Patient Consent Given: Yes Education handout provided: Yes Muscles treated: left lower lumbar multifidi and left lower gluteal using a .30x50 needle  Electrical stimulation performed: No Parameters: N/A Treatment response/outcome: Twitch response elicited and Palpable decrease in muscle tension   Manual: trigger point release to left lower lumbar spine and left gluteal; LAD bilateral grade II and III oscillations   Supine exercises:  Piriformis stretch 3x20 sec hold  LTR x20  Bridge x20   Seated:  Clamshells x20 red   Nu-step 5 min     - 1/5  Re-assessed back - reviewed stretches for lower back  - LTR x20  - Piriformis stretch 3x20 sec hold left   - hamstring stretch  3x20 sec hold  Manual therapy - trigger point release; LAD to right LE Grade III and IV    12/12 - PROM into flexion: grade II and III PA and AP mobilizations. Improved pain with joint mobs.  - quad set 3x10  - SLR 3x10  -  Standing weight shifts 2x10  -Standing march 2x10  - attempted bridging but patient unable to tolerate.    Vaso: low pressure: 32 degrees in elevated position 15 min            PATIENT EDUCATION:  Education details:  objective findings, HEP, Exercise form, and POC.  Person educated: Patent  Education method: Explanation, Demonstration, Tactile cues, and Verbal cues.  Handout.  Education comprehension: verbalized understanding, returned demonstration, verbal cues required, and tactile cues required     HOME EXERCISE PROGRAM: Access Code: [email or web address] URL: [email or web address] Date: 05/24/2021 Prepared by: Ronny Flurry   Exercises Supine Quadricep Sets - 2-3 x daily - 7 x weekly - 2 sets - 10 reps - 5 seconds hold ANKLE PUMPS - 1 x daily - 7 x weekly - 3 sets - 10 reps Supine Hip Abduction AROM - 2 x daily - 7 x weekly - 2 sets - 10 reps Seated  Knee Extension Stretch with Chair - 2-3 x daily - 7 x weekly - 1 sets - 2-3 reps - 2 minutes hold Supine Heel Slide - 2-3 x daily - 7 x weekly - 1 sets - 10 reps Standing Anterior Posterior Weight Shift with Chair - 1 x daily - 7 x weekly - 3 sets - 10 reps Supine Knee Extension Strengthening - 1 x daily - 7 x weekly - 3 sets - 10 reps Supine Heel Slide with Strap - 2 x daily - 7 x weekly - 1-2 sets - 10 reps - 3-5 seconds hold Supine Active Straight Leg Raise - 1 x daily - 7 x weekly - 2 sets - 10 reps Heel Raises with Counter Support - 1 x daily - 6-7 x weekly - 2 sets - 10 reps Standing Terminal Knee Extension with Resistance - 1 x daily - 6-7 x weekly - 2 sets - 10 reps         ASSESSMENT:   CLINICAL IMPRESSION: Patient continues to have tightness and spasming in her back. It has improved slightly but it is still there. She felt the needling helped the last time. She had a great twitch response today. She is increasing the amount of work that she is doing. Therapy added UE postural exercises and reviewed ow to brace her  core with these exercises. She will now have an option to not only work the LE. She may also look into getting a massage which is a great idea. Therapy will continue to progress as tolerated.  Objective impairments include Abnormal gait, decreased activity tolerance, decreased balance, decreased endurance, decreased mobility, difficulty walking, decreased ROM, decreased strength, hypomobility, increased edema, impaired flexibility, and pain. These impairments are limiting patient from cleaning, community activity, driving, meal prep, occupation, laundry, and shopping. Personal factors including  current back pain  are also affecting patient's functional outcome.       GOALS:     SHORT TERM GOALS:   STG Name Target Date Goal status  1 Pt will be independent and compliant with HEP for improved ROM, pain, strength, and function. Baseline:  05/29/2021 GOAL MET  2 Pt will demo a good quad set and perform supine SLR with no > than minimal quad lag for improved strength and stability with gait.  Baseline:  11/23/20222 GOAL MET  3 Pt will demo improved L knee AROM/PROM to 0 - 90/95 deg for improved mobility and stiffness Baseline: 06/05/2021 0-117 Met   4 Pt will demo improved TKE, toe off, and Wb'ing thru L LE with gait. Baseline: 05/29/2021 GOAL MET  5 Pt will ambulate in community with [place] with good stability without significant pain Baseline: 05/29/2021 GOAL MET     Patient will be independent with low back soft tissue mobilization   07/25/2020  New              LONG TERM GOALS:    LTG Name Target Date Goal status Checked on assessed on1/6  1 Pt will demo L knee AROM to be 0 - 115 deg for improved stiffness, mobility, and performance of stairs. Baseline: 07/03/2021  MET  2 Pt will ambulate extended community distance without significant pain or difficulty.  Baseline: 08/23/2021 Pain with lower back ongoing   3 Pt will ascend and descend stairs with a reciprocal gait with 1 rail without  significant pain.  Baseline: 07/03/2021 No difficulty upstairs Low back pain going down Ongoing   4 Pt will demo improved L hip  flexion strength to 5/5, hip abd strength to 4/5 MMT, and knee flex/ext strength to 5/5 MMT for improved tolerance to functional mobility and daily activities.  Baseline: 07/03/2021 5/5  5 Pt will be able to perform her normal daily transfers without significant difficulty.  Baseline: 07/03/2021 Performing independently   6 Pt will ambulate with a normalized heel to toe gait pattern without limping.  Baseline: 06/20/2021 No limp noted  Achieved  7 Pt will be able to perform her ADLs and IADLs without significant lumbar pain or  07/03/2021 Ongoing     PLAN: PT FREQUENCY:  1x   PT DURATION: 6 weeks    PLANNED INTERVENTIONS: Therapeutic exercises, Therapeutic activity, Neuro Muscular re-education, Balance training, Gait training, Patient/Family education, Joint mobilization, Stair training, Electrical stimulation, Spinal mobilization, Cryotherapy, Moist heat, scar mobilization, Taping, and Manual therapy   PLAN FOR NEXT SESSION:  consider needling; continue with manual therapy. Work on Copywriter, advertising.       Carney Living PT DPT  07/25/2021, 12:48 PM

## 2021-08-01 ENCOUNTER — Other Ambulatory Visit: Payer: Self-pay

## 2021-08-01 ENCOUNTER — Ambulatory Visit (HOSPITAL_BASED_OUTPATIENT_CLINIC_OR_DEPARTMENT_OTHER): Payer: 59 | Admitting: Physical Therapy

## 2021-08-01 ENCOUNTER — Encounter (HOSPITAL_BASED_OUTPATIENT_CLINIC_OR_DEPARTMENT_OTHER): Payer: Self-pay | Admitting: Physical Therapy

## 2021-08-01 DIAGNOSIS — M25562 Pain in left knee: Secondary | ICD-10-CM | POA: Diagnosis not present

## 2021-08-01 DIAGNOSIS — M25662 Stiffness of left knee, not elsewhere classified: Secondary | ICD-10-CM | POA: Diagnosis not present

## 2021-08-01 DIAGNOSIS — R262 Difficulty in walking, not elsewhere classified: Secondary | ICD-10-CM | POA: Diagnosis not present

## 2021-08-01 DIAGNOSIS — G8929 Other chronic pain: Secondary | ICD-10-CM | POA: Diagnosis not present

## 2021-08-01 DIAGNOSIS — M545 Low back pain, unspecified: Secondary | ICD-10-CM | POA: Diagnosis not present

## 2021-08-01 DIAGNOSIS — M6281 Muscle weakness (generalized): Secondary | ICD-10-CM | POA: Diagnosis not present

## 2021-08-01 NOTE — Therapy (Signed)
OUTPATIENT PHYSICAL THERAPY TREATMENT NOTE   Patient Name: [name] MRN: [id number] DOB:[birth year], 56 y.o., female Today's Date: 08/01/2021  PCP: Mayra Neer, MD REFERRING PROVIDER: Mayra Neer, MD   PT End of Session - 08/01/21 0926     Visit Number 4    Number of Visits 6    Date for PT Re-Evaluation 08/23/21    PT Start Time 0846    PT Stop Time 0926    PT Time Calculation (min) 40 min    Activity Tolerance Patient tolerated treatment well    Behavior During Therapy [facility] for tasks assessed/performed              Past Medical History:  Diagnosis Date   Acute medial meniscus tear of left knee 08/21/2020   Allergic rhinitis due to pollen 08/21/2020   Benign hypertension 08/21/2020   Genital warts 08/21/2020   Hematuria 08/21/2020   History of kidney stones    Major depression 08/21/2020   Mixed hyperlipidemia 08/21/2020   Obesity 08/21/2020   Pre-diabetes    Primary localized osteoarthritis of left knee 04/18/2021   Primary localized osteoarthritis of left knee    Radicular syndrome of left leg 04/23/2021   Scoliosis of lumbar region due to degenerative disease of spine in adult 04/23/2021   Sleep apnea    Somnolence 08/21/2020   Tobacco abuse 08/21/2020   Past Surgical History:  Procedure Laterality Date   ANKLE FRACTURE SURGERY     CESAREAN SECTION     FRACTURE SURGERY     HAND SURGERY     KNEE ARTHROSCOPY WITH MEDIAL MENISECTOMY Left 09/04/2020   Procedure: KNEE ARTHROSCOPY WITH MEDIAL MENISECTOMY;  Surgeon: Elsie Saas, MD;  Location: [place];  Service: Orthopedics;  Laterality: Left;   ROTATOR CUFF REPAIR     TOTAL KNEE ARTHROPLASTY Left 05/06/2021   Procedure: TOTAL KNEE ARTHROPLASTY;  Surgeon: Elsie Saas, MD;  Location: [facility];  Service: Orthopedics;  Laterality: Left;   TUBAL LIGATION     Patient Active Problem List   Diagnosis Date Noted   S/P total knee arthroplasty, left 05/06/2021   Radicular syndrome of  left leg 04/23/2021   Scoliosis of lumbar region due to degenerative disease of spine in adult 04/23/2021   Primary localized osteoarthritis of left knee 04/18/2021   Allergic rhinitis due to pollen 08/21/2020   Benign hypertension 08/21/2020   Genital warts 08/21/2020   Hematuria 08/21/2020   Major depression 08/21/2020   Mixed hyperlipidemia 08/21/2020   Obesity 08/21/2020   Somnolence 08/21/2020   Tobacco abuse 08/21/2020   Acute medial meniscus tear of left knee 08/21/2020   REFERRING PROVIDER: Matthew Saras, PA*; Surgeon:  Dr. Marquette Saa DIAG: M17.12 (ICD-10-CM) - Primary localized osteoarthrosis of the knee, left  M54.50,M79.605 (ICD-10-CM) - Low back pain radiating to left leg  M41.50 (ICD-10-CM) - Scoliosis due to degenerative disease of spine in adult patient    THERAPY DIAG:  Pain in L knee Joint stiffness in L knee Muscle weakness Difficulty in walking Presence of artificial knee replacement   ONSET DATE: 05/06/2021 L TKA   SUBJECTIVE:    SUBJECTIVE STATEMENT:  I went back to work last night. 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 I am seeing the massage therapist today. I am doing 6 hr shifts right now- planning to increase by 2 hours at a time.   PERTINENT HISTORY: L3 disc bulge and scoliosis.  R RCR, L ankle surgery.  PAIN:  PAIN:  Are you having pain? Yes VAS scale: 2-3/10 Pain location: left lower back  Pain orientation: Left  PAIN TYPE: aching/ tightness  Pain description: constant  Aggravating factors: standing, walking , bending  Relieving factors: rest  PRECAUTIONS: Other: Per TKA protocol.  Recent back issues   WEIGHT BEARING RESTRICTIONS WBAT     PATIENT GOALS: to be able to go for a nice long walk     OBJECTIVE:  1/26 THERACT: patient mobility- rolling, transferring beds,  sit<>stand, sliding THEREX: weight shift stance with 15lb cable pull, supine bicycles, long sitting post pelvic tilt    TODAY'S  TREATMENT: 1/19 Trigger Point Dry-Needling  Treatment instructions: Expect mild to moderate muscle soreness. S/S of pneumothorax if dry needled over a lung field, and to seek immediate medical attention should they occur. Patient verbalized understanding of these instructions and education.  Patient Consent Given: Yes Education handout provided: Yes Muscles treated: left lower lumbar multifidi and left lower gluteal using a .30x50  and a .30x75 in the glutealneedle 2x. 2 spots in right lower lumbar paraspinal  Electrical stimulation performed: No Parameters: N/A Treatment response/outcome: Twitch response elicited and Palpable decrease in muscle tension   Manual: trigger point release to left lower lumbar spine and left gluteal; LAD bilateral grade II and III oscillations   Supine exercises:  Piriformis stretch 3x20 sec hold  LTR x20  Bridge x20  SLR x20   Rows 2x15 red  Shoulder extension 2x10 red    1/12 Trigger Point Dry-Needling  Treatment instructions: Expect mild to moderate muscle soreness. S/S of pneumothorax if dry needled over a lung field, and to seek immediate medical attention should they occur. Patient verbalized understanding of these instructions and education.  Patient Consent Given: Yes Education handout provided: Yes Muscles treated: left lower lumbar multifidi and left lower gluteal using a .30x50 needle  Electrical stimulation performed: No Parameters: N/A Treatment response/outcome: Twitch response elicited and Palpable decrease in muscle tension   Manual: trigger point release to left lower lumbar spine and left gluteal; LAD bilateral grade II and III oscillations   Supine exercises:  Piriformis stretch 3x20 sec hold  LTR x20  Bridge x20   Seated:  Clamshells x20 red   Nu-step 5 min     - 1/5  Re-assessed back - reviewed stretches for lower back  - LTR x20  - Piriformis stretch 3x20 sec hold left   - hamstring stretch  3x20 sec hold   Manual therapy - trigger point release; LAD to right LE Grade III and IV    12/12 - PROM into flexion: grade II and III PA and AP mobilizations. Improved pain with joint mobs.  - quad set 3x10  - SLR 3x10  - Standing weight shifts 2x10  -Standing march 2x10  - attempted bridging but patient unable to tolerate.    Vaso: low pressure: 32 degrees in elevated position 15 min            PATIENT EDUCATION:  Education details:  work activity modification Person educated: Special educational needs teacher: Explanation, Demonstration, Tactile cues, and Verbal cues.  Handout.  Education comprehension: verbalized understanding, returned demonstration, verbal cues required, and tactile cues required     HOME EXERCISE PROGRAM: Access Code: [email or web address] URL: [email or web address]        ASSESSMENT:   CLINICAL IMPRESSION: Since she has an appt with the massage therapist today, we chose to focus on education regarding work-specific movements. Major focus was to widen stance  to shift weight rather than moving via trunk and engaging lower back musculature. She does have some weakness and fear of Lt knee ability so she was instructed in strengthening exercise similar to the motion that she can work on with a cable machine. Added some core exercise to HEP for gross strengthening and will benefit from progressions.  PT will try to connect with massage therapist prior to appointment today.    Objective impairments include Abnormal gait, decreased activity tolerance, decreased balance, decreased endurance, decreased mobility, difficulty walking, decreased ROM, decreased strength, hypomobility, increased edema, impaired flexibility, and pain. These impairments are limiting patient from cleaning, community activity, driving, meal prep, occupation, laundry, and shopping. Personal factors including  current back pain  are also affecting patient's functional outcome.       GOALS:     SHORT TERM  GOALS:   STG Name Target Date Goal status  1 Pt will be independent and compliant with HEP for improved ROM, pain, strength, and function. Baseline:  05/29/2021 GOAL MET  2 Pt will demo a good quad set and perform supine SLR with no > than minimal quad lag for improved strength and stability with gait.  Baseline:  11/23/20222 GOAL MET  3 Pt will demo improved L knee AROM/PROM to 0 - 90/95 deg for improved mobility and stiffness Baseline: 06/05/2021 0-117 Met   4 Pt will demo improved TKE, toe off, and Wb'ing thru L LE with gait. Baseline: 05/29/2021 GOAL MET  5 Pt will ambulate in community with [place] with good stability without significant pain Baseline: 05/29/2021 GOAL MET     Patient will be independent with low back soft tissue mobilization   07/25/2020  New              LONG TERM GOALS:    LTG Name Target Date Goal status Checked on assessed on1/6  1 Pt will demo L knee AROM to be 0 - 115 deg for improved stiffness, mobility, and performance of stairs. Baseline: 07/03/2021  MET  2 Pt will ambulate extended community distance without significant pain or difficulty.  Baseline: 08/23/2021 Pain with lower back ongoing   3 Pt will ascend and descend stairs with a reciprocal gait with 1 rail without significant pain.  Baseline: 07/03/2021 No difficulty upstairs Low back pain going down Ongoing   4 Pt will demo improved L hip flexion strength to 5/5, hip abd strength to 4/5 MMT, and knee flex/ext strength to 5/5 MMT for improved tolerance to functional mobility and daily activities.  Baseline: 07/03/2021 5/5  5 Pt will be able to perform her normal daily transfers without significant difficulty.  Baseline: 07/03/2021 Performing independently   6 Pt will ambulate with a normalized heel to toe gait pattern without limping.  Baseline: 06/20/2021 No limp noted  Achieved  7 Pt will be able to perform her ADLs and IADLs without significant lumbar pain or  07/03/2021 Ongoing     PLAN: PT  FREQUENCY:  1x   PT DURATION: 6 weeks    PLANNED INTERVENTIONS: Therapeutic exercises, Therapeutic activity, Neuro Muscular re-education, Balance training, Gait training, Patient/Family education, Joint mobilization, Stair training, Electrical stimulation, Spinal mobilization, Cryotherapy, Moist heat, scar mobilization, Taping, and Manual therapy   PLAN FOR NEXT SESSION:  Needs new goals set, how was massage? Was she able to utilize proper mechanics at work?    Selinda Eon PT DPT  08/01/2021, 10:26 AM

## 2021-08-05 ENCOUNTER — Other Ambulatory Visit: Payer: Self-pay | Admitting: Family Medicine

## 2021-08-05 DIAGNOSIS — Z1231 Encounter for screening mammogram for malignant neoplasm of breast: Secondary | ICD-10-CM

## 2021-08-08 ENCOUNTER — Encounter (HOSPITAL_BASED_OUTPATIENT_CLINIC_OR_DEPARTMENT_OTHER): Payer: Self-pay | Admitting: Physical Therapy

## 2021-08-08 ENCOUNTER — Ambulatory Visit (HOSPITAL_BASED_OUTPATIENT_CLINIC_OR_DEPARTMENT_OTHER): Payer: 59 | Attending: Physician Assistant | Admitting: Physical Therapy

## 2021-08-08 ENCOUNTER — Other Ambulatory Visit: Payer: Self-pay

## 2021-08-08 DIAGNOSIS — M25562 Pain in left knee: Secondary | ICD-10-CM

## 2021-08-08 DIAGNOSIS — R262 Difficulty in walking, not elsewhere classified: Secondary | ICD-10-CM

## 2021-08-08 DIAGNOSIS — M25662 Stiffness of left knee, not elsewhere classified: Secondary | ICD-10-CM | POA: Diagnosis not present

## 2021-08-08 DIAGNOSIS — M6281 Muscle weakness (generalized): Secondary | ICD-10-CM | POA: Diagnosis not present

## 2021-08-08 DIAGNOSIS — M545 Low back pain, unspecified: Secondary | ICD-10-CM

## 2021-08-08 DIAGNOSIS — G8929 Other chronic pain: Secondary | ICD-10-CM | POA: Diagnosis not present

## 2021-08-08 NOTE — Therapy (Signed)
OUTPATIENT PHYSICAL THERAPY TREATMENT NOTE   Patient Name: [name] MRN: [id number] DOB:1966/04/12, 56 y.o., female Today's Date: 08/08/2021  PCP: Mayra Neer, MD REFERRING PROVIDER: Mayra Neer, MD   PT End of Session - 08/08/21 1024     Visit Number 5    Number of Visits 6    Date for PT Re-Evaluation 08/23/21    PT Start Time 5621    PT Stop Time 1057    PT Time Calculation (min) 42 min    Activity Tolerance Patient tolerated treatment well    Behavior During Therapy [facility] for tasks assessed/performed              Past Medical History:  Diagnosis Date   Acute medial meniscus tear of left knee 08/21/2020   Allergic rhinitis due to pollen 08/21/2020   Benign hypertension 08/21/2020   Genital warts 08/21/2020   Hematuria 08/21/2020   History of kidney stones    Major depression 08/21/2020   Mixed hyperlipidemia 08/21/2020   Obesity 08/21/2020   Pre-diabetes    Primary localized osteoarthritis of left knee 04/18/2021   Primary localized osteoarthritis of left knee    Radicular syndrome of left leg 04/23/2021   Scoliosis of lumbar region due to degenerative disease of spine in adult 04/23/2021   Sleep apnea    Somnolence 08/21/2020   Tobacco abuse 08/21/2020   Past Surgical History:  Procedure Laterality Date   ANKLE FRACTURE SURGERY     CESAREAN SECTION     FRACTURE SURGERY     HAND SURGERY     KNEE ARTHROSCOPY WITH MEDIAL MENISECTOMY Left 09/04/2020   Procedure: KNEE ARTHROSCOPY WITH MEDIAL MENISECTOMY;  Surgeon: Elsie Saas, MD;  Location: [place];  Service: Orthopedics;  Laterality: Left;   ROTATOR CUFF REPAIR     TOTAL KNEE ARTHROPLASTY Left 05/06/2021   Procedure: TOTAL KNEE ARTHROPLASTY;  Surgeon: Elsie Saas, MD;  Location: [facility];  Service: Orthopedics;  Laterality: Left;   TUBAL LIGATION     Patient Active Problem List   Diagnosis Date Noted   S/P total knee arthroplasty, left 05/06/2021   Radicular syndrome of  left leg 04/23/2021   Scoliosis of lumbar region due to degenerative disease of spine in adult 04/23/2021   Primary localized osteoarthritis of left knee 04/18/2021   Allergic rhinitis due to pollen 08/21/2020   Benign hypertension 08/21/2020   Genital warts 08/21/2020   Hematuria 08/21/2020   Major depression 08/21/2020   Mixed hyperlipidemia 08/21/2020   Obesity 08/21/2020   Somnolence 08/21/2020   Tobacco abuse 08/21/2020   Acute medial meniscus tear of left knee 08/21/2020   REFERRING PROVIDER: Matthew Saras, PA*; Surgeon:  Dr. Marquette Saa DIAG: M17.12 (ICD-10-CM) - Primary localized osteoarthrosis of the knee, left  M54.50,M79.605 (ICD-10-CM) - Low back pain radiating to left leg  M41.50 (ICD-10-CM) - Scoliosis due to degenerative disease of spine in adult patient    THERAPY DIAG:  Pain in L knee Joint stiffness in L knee Muscle weakness Difficulty in walking Presence of artificial knee replacement   ONSET DATE: 05/06/2021 L TKA   SUBJECTIVE:    SUBJECTIVE STATEMENT:  I went back to work last night. 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 I am seeing the massage therapist today. I am doing 6 hr shifts right now- planning to increase by 2 hours at a time.   PERTINENT HISTORY: L3 disc bulge and scoliosis.  R RCR, L ankle surgery.   PAIN:  PAIN:  Are you having pain? Yes VAS scale: 2-3/10 Pain location: left lower back  Pain orientation: Left  PAIN TYPE: aching/ tightness  Pain description: constant  Aggravating factors: standing, walking , bending  Relieving factors: rest  PRECAUTIONS: Other: Per TKA protocol.  Recent back issues   WEIGHT BEARING RESTRICTIONS WBAT     PATIENT GOALS: to be able to go for a nice long walk     OBJECTIVE:  2/2 LTR x20 each side  Supine march 2x10  Supine bicycle 2x10    Bridging 2x10   Cable machine   Scap retraction 2x10 10lbs  Shoulder extension 2x10 10lbs   Leg press 2x25 50 lbs   Cuing  for core contraction with all.    1/26 THERACT: patient mobility- rolling, transferring beds,  sit<>stand, sliding THEREX: weight shift stance with 15lb cable pull, supine bicycles, long sitting post pelvic tilt    TODAY'S TREATMENT: 1/19 Trigger Point Dry-Needling  Treatment instructions: Expect mild to moderate muscle soreness. S/S of pneumothorax if dry needled over a lung field, and to seek immediate medical attention should they occur. Patient verbalized understanding of these instructions and education.  Patient Consent Given: Yes Education handout provided: Yes Muscles treated: left lower lumbar multifidi and left lower gluteal using a .30x50  and a .30x75 in the glutealneedle 2x. 2 spots in right lower lumbar paraspinal  Electrical stimulation performed: No Parameters: N/A Treatment response/outcome: Twitch response elicited and Palpable decrease in muscle tension   Manual: trigger point release to left lower lumbar spine and left gluteal; LAD bilateral grade II and III oscillations   Supine exercises:  Piriformis stretch 3x20 sec hold  LTR x20  Bridge x20  SLR x20   Rows 2x15 red  Shoulder extension 2x10 red    1/12 Trigger Point Dry-Needling  Treatment instructions: Expect mild to moderate muscle soreness. S/S of pneumothorax if dry needled over a lung field, and to seek immediate medical attention should they occur. Patient verbalized understanding of these instructions and education.  Patient Consent Given: Yes Education handout provided: Yes Muscles treated: left lower lumbar multifidi and left lower gluteal using a .30x50 needle  Electrical stimulation performed: No Parameters: N/A Treatment response/outcome: Twitch response elicited and Palpable decrease in muscle tension   Manual: trigger point release to left lower lumbar spine and left gluteal; LAD bilateral grade II and III oscillations   Supine exercises:  Piriformis stretch 3x20 sec hold  LTR x20   Bridge x20   Seated:  Clamshells x20 red   Nu-step 5 min           PATIENT EDUCATION:  Education details:  work activity modification Person educated: Special educational needs teacher: Explanation, Media planner, Corporate treasurer cues, and Verbal cues.  Handout.  Education comprehension: verbalized understanding, returned demonstration, verbal cues required, and tactile cues required     HOME EXERCISE PROGRAM: Access Code: [email or web address] URL: [email or web address]        ASSESSMENT:   CLINICAL IMPRESSION: Patient is making good progress. She has had very little pain. She has twinges every once in a while. She is back fully to work. Therapy reviewed gy exercises with her today. She tolerated well. We also reviewed progression of her table exercises. Therapy educated her on how use her exercises depending on how she is feeling and doing. She will take a few weeks and work on her HEP. If she is doing well she may discharge. She may also come in for 1 more follow up.  Objective impairments include Abnormal gait, decreased activity tolerance, decreased balance, decreased endurance, decreased mobility, difficulty walking, decreased ROM, decreased strength, hypomobility, increased edema, impaired flexibility, and pain. These impairments are limiting patient from cleaning, community activity, driving, meal prep, occupation, laundry, and shopping. Personal factors including  current back pain  are also affecting patient's functional outcome.       GOALS:     SHORT TERM GOALS:   STG Name Target Date Goal status  1 Pt will be independent and compliant with HEP for improved ROM, pain, strength, and function. Baseline:  05/29/2021 GOAL MET  2 Pt will demo a good quad set and perform supine SLR with no > than minimal quad lag for improved strength and stability with gait.  Baseline:  11/23/20222 GOAL MET  3 Pt will demo improved L knee AROM/PROM to 0 - 90/95 deg for improved mobility and  stiffness Baseline: 06/05/2021 0-117 Met   4 Pt will demo improved TKE, toe off, and Wb'ing thru L LE with gait. Baseline: 05/29/2021 GOAL MET  5 Pt will ambulate in community with [place] with good stability without significant pain Baseline: 05/29/2021 GOAL MET     Patient will be independent with low back soft tissue mobilization   07/25/2020  New              LONG TERM GOALS:    LTG Name Target Date Goal status Checked on assessed on1/6  1 Pt will demo L knee AROM to be 0 - 115 deg for improved stiffness, mobility, and performance of stairs. Baseline: 07/03/2021  MET  2 Pt will ambulate extended community distance without significant pain or difficulty.  Baseline: 08/23/2021 Pain with lower back ongoing   3 Pt will ascend and descend stairs with a reciprocal gait with 1 rail without significant pain.  Baseline: 07/03/2021 No difficulty upstairs Low back pain going down Ongoing   4 Pt will demo improved L hip flexion strength to 5/5, hip abd strength to 4/5 MMT, and knee flex/ext strength to 5/5 MMT for improved tolerance to functional mobility and daily activities.  Baseline: 07/03/2021 5/5  5 Pt will be able to perform her normal daily transfers without significant difficulty.  Baseline: 07/03/2021 Performing independently   6 Pt will ambulate with a normalized heel to toe gait pattern without limping.  Baseline: 06/20/2021 No limp noted  Achieved  7 Pt will be able to perform her ADLs and IADLs without significant lumbar pain or  07/03/2021 Ongoing     PLAN: PT FREQUENCY:  1x   PT DURATION: 6 weeks    PLANNED INTERVENTIONS: Therapeutic exercises, Therapeutic activity, Neuro Muscular re-education, Balance training, Gait training, Patient/Family education, Joint mobilization, Stair training, Electrical stimulation, Spinal mobilization, Cryotherapy, Moist heat, scar mobilization, Taping, and Manual therapy   PLAN FOR NEXT SESSION:  Needs new goals set, how was massage? Was she  able to utilize proper mechanics at work?    Carney Living PT DPT  08/08/2021, 10:27 AM

## 2021-08-16 ENCOUNTER — Ambulatory Visit
Admission: RE | Admit: 2021-08-16 | Discharge: 2021-08-16 | Disposition: A | Discharge status: Home / Self Care | Payer: 59 | Source: Ambulatory Visit | Attending: Family Medicine | Admitting: Family Medicine

## 2021-08-16 ENCOUNTER — Other Ambulatory Visit: Payer: Self-pay

## 2021-08-16 DIAGNOSIS — Z1231 Encounter for screening mammogram for malignant neoplasm of breast: Secondary | ICD-10-CM

## 2021-08-26 NOTE — Therapy (Addendum)
OUTPATIENT PHYSICAL THERAPY TREATMENT NOTE / DISCHARGE NOTE   Patient Name: [name] MRN: [id number] DOB:18-Nov-1965, 56 y.o., female Today's Date: 08/28/2021  PCP: Mayra Neer, MD    PT End of Session - 08/27/21 1346     Visit Number 6    Number of Visits 6    Date for PT Re-Evaluation 08/27/21    PT Start Time 1304    PT Stop Time 1343    PT Time Calculation (min) 39 min    Activity Tolerance Patient tolerated treatment well    Behavior During Therapy [facility] for tasks assessed/performed               Past Medical History:  Diagnosis Date   Acute medial meniscus tear of left knee 08/21/2020   Allergic rhinitis due to pollen 08/21/2020   Benign hypertension 08/21/2020   Genital warts 08/21/2020   Hematuria 08/21/2020   History of kidney stones    Major depression 08/21/2020   Mixed hyperlipidemia 08/21/2020   Obesity 08/21/2020   Pre-diabetes    Primary localized osteoarthritis of left knee 04/18/2021   Primary localized osteoarthritis of left knee    Radicular syndrome of left leg 04/23/2021   Scoliosis of lumbar region due to degenerative disease of spine in adult 04/23/2021   Sleep apnea    Somnolence 08/21/2020   Tobacco abuse 08/21/2020   Past Surgical History:  Procedure Laterality Date   ANKLE FRACTURE SURGERY     CESAREAN SECTION     FRACTURE SURGERY     HAND SURGERY     KNEE ARTHROSCOPY WITH MEDIAL MENISECTOMY Left 09/04/2020   Procedure: KNEE ARTHROSCOPY WITH MEDIAL MENISECTOMY;  Surgeon: Elsie Saas, MD;  Location: [place];  Service: Orthopedics;  Laterality: Left;   ROTATOR CUFF REPAIR     TOTAL KNEE ARTHROPLASTY Left 05/06/2021   Procedure: TOTAL KNEE ARTHROPLASTY;  Surgeon: Elsie Saas, MD;  Location: [facility];  Service: Orthopedics;  Laterality: Left;   TUBAL LIGATION     Patient Active Problem List   Diagnosis Date Noted   S/P total knee arthroplasty, left 05/06/2021   Radicular syndrome of left leg 04/23/2021    Scoliosis of lumbar region due to degenerative disease of spine in adult 04/23/2021   Primary localized osteoarthritis of left knee 04/18/2021   Allergic rhinitis due to pollen 08/21/2020   Benign hypertension 08/21/2020   Genital warts 08/21/2020   Hematuria 08/21/2020   Major depression 08/21/2020   Mixed hyperlipidemia 08/21/2020   Obesity 08/21/2020   Somnolence 08/21/2020   Tobacco abuse 08/21/2020   Acute medial meniscus tear of left knee 08/21/2020   REFERRING PROVIDER: Matthew Saras, PA*; Surgeon:  Dr. Noemi Chapel   REFERRING DIAG: M17.12 (ICD-10-CM) - Primary localized osteoarthrosis of the knee, left  M54.50,M79.605 (ICD-10-CM) - Low back pain radiating to left leg  M41.50 (ICD-10-CM) - Scoliosis due to degenerative disease of spine in adult patient    THERAPY DIAG:  Pain in L knee Joint stiffness in L knee Muscle weakness Difficulty in walking Presence of artificial knee replacement   ONSET DATE: 05/06/2021 L TKA   SUBJECTIVE:    SUBJECTIVE STATEMENT:  Pt had a L TKA and did well in PT.  Pt states her knee has been more tight lately.  She has been using ice/heat.  Pt reports her knee feels tighter with descending stairs.  Pt states she is not having the sharp shooting pain that she was.  She has returned to work and reports  having increased tightness in knee and back though is not having increased pain.  Pt is working 12 hour shifts currently.  Pt states she is doing well overall with work activities though has increased swelling in L knee after work.      FUNCTIONAL IMPROVEMENTS:  ROM, gait, ambulation distance.  Pt reports no significant difficulty with ambulating prolonged distance.  Work activities  FUNCTIONAL LIMITATIONS:  Unable to increase speed with getting down hallways at work, discomfort in prolonged positions at work, minimal difficulty with car transfers due to knee tightness  Pt is receiving massages every 3 weeks and reports improved sx's.      PERTINENT HISTORY: L3 disc bulge and scoliosis.  R RCR, L ankle surgery.   PAIN:  PAIN:  Are you having pain? Yes NPRS scale: 1/10 current and best, 3-4/10 worst  Pain Location:  L knee NPRS scale: 2-3/10 current, 4-5/10 worst, 1/10 best Pain location: left lower back  Pain orientation: Left  PAIN TYPE: aching/ tightness  Pain description: constant  Aggravating factors: standing, walking , bending  Relieving factors: rest   PRECAUTIONS: Other: Per TKA protocol.  Recent back issues   WEIGHT BEARING RESTRICTIONS WBAT     PATIENT GOALS: to be able to go for a nice long walk     OBJECTIVE:     LE AROM/PROM:   A/PROM Right 05/09/2021 Left 05/09/2021  Hip flexion      Hip extension      Hip abduction      Hip adduction      Hip internal rotation      Hip external rotation      Knee flexion 130 119  Knee extension WNL with hyperextension 0  Ankle dorsiflexion      Ankle plantarflexion      Ankle inversion      Ankle eversion       (Blank rows = not tested)   Lumbar AROM:  WFL t/o without pain.  Felt some tightness in [place]     LE MMT:   MMT Right 05/09/2021 Left 05/09/2021  Hip flexion 30.6 Prior/Current HHD 15.7/22.3   5/5  Hip extension      Hip abduction 35.6  Prior HHD 40.4 5/5 current   Hip adduction      Hip internal rotation      Hip external rotation      Knee flexion   5/5   Knee extension 67.9 Prior HHD 63.2  5/5  Ankle dorsiflexion      Ankle plantarflexion      Ankle inversion      Ankle eversion       (Blank rows = not tested)       GAIT: Pt ambulates with a normalized heel to toe gait without limping.    STAIRS: Pt ascended stairs with a reciprocal gait with 1 rail and descended stairs with a step thru gait with 1 rail.      PATIENT EDUCATION:  Education details:  objective findings, POC, and HEP.  Person educated: Patent  Education method: Explanation, Demonstration, and Handout.  Education comprehension: verbalized  understanding, returned demonstration,     HOME EXERCISE PROGRAM: Access Code: [email or web address] URL: [email or web address]        ASSESSMENT:   CLINICAL IMPRESSION: Pt has made great progress and is doing well.  Pt progressed well in PT after TKA and began seeing PT with increased focus on lumbar pain since 07/11/2021 appointment.  Pt demonstrates good knee ROM and 5/5 strength  in L hip flex and abd and knee flex and extension.  Pt's lumbar AROM is [facility] t/o without pain.  Pt has returned to work activities and is performing her normal work hours.  She reports having increased tightness and some swelling in knee after work though is not having increased pain.  She is now getting massages which has helped her lumbar pain.  Pt demonstrates good understanding of HEP.   Pt has met all of her goals except partially met goal of stairs due to a step thru gait with descending stairs.  Pt is ready for discharge.   Objective impairments include Abnormal gait, decreased activity tolerance, decreased balance, decreased endurance, decreased mobility, difficulty walking, decreased ROM, decreased strength, hypomobility, increased edema, impaired flexibility, and pain. These impairments are limiting patient from cleaning, community activity, driving, meal prep, occupation, laundry, and shopping. Personal factors including  current back pain  are also affecting patient's functional outcome.       GOALS:     SHORT TERM GOALS:   STG Name Target Date Goal status  1 Pt will be independent and compliant with HEP for improved ROM, pain, strength, and function. Baseline:  05/29/2021 GOAL MET  2 Pt will demo a good quad set and perform supine SLR with no > than minimal quad lag for improved strength and stability with gait.  Baseline:  11/23/20222 GOAL MET  3 Pt will demo improved L knee AROM/PROM to 0 - 90/95 deg for improved mobility and stiffness Baseline: 06/05/2021 GOAL Met   4 Pt will demo improved  TKE, toe off, and Wb'ing thru L LE with gait. Baseline: 05/29/2021 GOAL MET  5 Pt will ambulate in community with [place] with good stability without significant pain Baseline: 05/29/2021 GOAL MET     Patient will be independent with low back soft tissue mobilization   07/25/2020 GOAL MET             LONG TERM GOALS:    LTG Name Target Date Goal status Checked on assessed on1/6  1 Pt will demo L knee AROM to be 0 - 115 deg for improved stiffness, mobility, and performance of stairs. Baseline: 07/03/2021  MET  2 Pt will ambulate extended community distance without significant pain or difficulty.  Baseline: 08/23/2021 GOAL MET  3 Pt will ascend and descend stairs with a reciprocal gait with 1 rail without significant pain.  Baseline: 07/03/2021 MET ASCENDING AND PARTIALLY MET DESCENDING   4 Pt will demo improved L hip flexion strength to 5/5, hip abd strength to 4/5 MMT, and knee flex/ext strength to 5/5 MMT for improved tolerance to functional mobility and daily activities.  Baseline: 07/03/2021 GOAL MET  5 Pt will be able to perform her normal daily transfers without significant difficulty.  Baseline: 07/03/2021 GOAL MET  6 Pt will ambulate with a normalized heel to toe gait pattern without limping.  Baseline: 06/20/2021 No limp noted  Achieved  7 Pt will be able to perform her ADLs and IADLs without significant lumbar pain or  07/03/2021 GOAL MET     PLAN: PT FREQUENCY:  1 VISIT   PT DURATION:1 VISIT   PLANNED INTERVENTIONS: Therapeutic exercises, Therapeutic activity, Neuro Muscular re-education, Balance training, Gait training, Patient/Family education, Joint mobilization, Stair training, Electrical stimulation, Spinal mobilization, Cryotherapy, Moist heat, scar mobilization, Taping, and Manual therapy   PLAN FOR NEXT SESSION:  Pt will be discharged from skilled PT services due to meeting her goals except partially meeting stairs goal. She is  agreeable with discharge and will cont with  HEP.    PHYSICAL THERAPY DISCHARGE SUMMARY  Visits from Start of Care: 19  Current functional level related to goals / functional outcomes: See above   Remaining deficits: See above   Education / Equipment: See above   Patient agrees to discharge. Patient is being discharged due to meeting her goals except partially meeting stairs goal.    Selinda Michaels III PT, DPT 08/28/21 8:09 PM

## 2021-08-27 ENCOUNTER — Other Ambulatory Visit: Payer: Self-pay

## 2021-08-27 ENCOUNTER — Ambulatory Visit (HOSPITAL_BASED_OUTPATIENT_CLINIC_OR_DEPARTMENT_OTHER): Payer: 59 | Admitting: Physical Therapy

## 2021-08-27 DIAGNOSIS — M25562 Pain in left knee: Secondary | ICD-10-CM | POA: Diagnosis not present

## 2021-08-27 DIAGNOSIS — M545 Low back pain, unspecified: Secondary | ICD-10-CM | POA: Diagnosis not present

## 2021-08-27 DIAGNOSIS — M6281 Muscle weakness (generalized): Secondary | ICD-10-CM | POA: Diagnosis not present

## 2021-08-27 DIAGNOSIS — R262 Difficulty in walking, not elsewhere classified: Secondary | ICD-10-CM | POA: Diagnosis not present

## 2021-08-27 DIAGNOSIS — G8929 Other chronic pain: Secondary | ICD-10-CM | POA: Diagnosis not present

## 2021-08-27 DIAGNOSIS — M25662 Stiffness of left knee, not elsewhere classified: Secondary | ICD-10-CM | POA: Diagnosis not present

## 2021-08-28 ENCOUNTER — Encounter (HOSPITAL_BASED_OUTPATIENT_CLINIC_OR_DEPARTMENT_OTHER): Payer: Self-pay | Admitting: Physical Therapy

## 2021-08-28 ENCOUNTER — Other Ambulatory Visit (HOSPITAL_COMMUNITY): Payer: Self-pay

## 2021-08-28 MED ORDER — VENLAFAXINE HCL ER 75 MG PO CP24
150.0000 mg | ORAL_CAPSULE | Freq: Every day | ORAL | 2 refills | Status: DC
  Filled 2021-08-28: qty 180, 90d supply, fill #0
  Filled 2022-02-21 – 2022-02-22 (×2): qty 180, 90d supply, fill #1
  Filled 2022-08-21: qty 180, 90d supply, fill #2

## 2021-09-09 DIAGNOSIS — M1712 Unilateral primary osteoarthritis, left knee: Secondary | ICD-10-CM | POA: Diagnosis not present

## 2021-09-25 ENCOUNTER — Other Ambulatory Visit (HOSPITAL_COMMUNITY): Payer: Self-pay

## 2021-09-25 DIAGNOSIS — R062 Wheezing: Secondary | ICD-10-CM | POA: Diagnosis not present

## 2021-09-25 DIAGNOSIS — R059 Cough, unspecified: Secondary | ICD-10-CM | POA: Diagnosis not present

## 2021-09-25 MED ORDER — PREDNISONE 20 MG PO TABS
ORAL_TABLET | ORAL | 0 refills | Status: AC
  Filled 2021-09-25: qty 12, 6d supply, fill #0

## 2021-09-25 MED ORDER — HYDROCODONE BIT-HOMATROP MBR 5-1.5 MG/5ML PO SOLN
ORAL | 0 refills | Status: AC
  Filled 2021-09-25: qty 100, 10d supply, fill #0

## 2021-09-25 MED ORDER — AMOXICILLIN-POT CLAVULANATE 875-125 MG PO TABS
ORAL_TABLET | ORAL | 0 refills | Status: AC
  Filled 2021-09-25: qty 14, 7d supply, fill #0

## 2021-09-30 ENCOUNTER — Ambulatory Visit
Admission: RE | Admit: 2021-09-30 | Discharge: 2021-09-30 | Disposition: A | Discharge status: Home / Self Care | Payer: 59 | Source: Ambulatory Visit | Attending: Physician Assistant | Admitting: Physician Assistant

## 2021-09-30 ENCOUNTER — Other Ambulatory Visit: Payer: Self-pay | Admitting: Physician Assistant

## 2021-09-30 ENCOUNTER — Other Ambulatory Visit (HOSPITAL_COMMUNITY): Payer: Self-pay

## 2021-09-30 DIAGNOSIS — R0602 Shortness of breath: Secondary | ICD-10-CM

## 2021-09-30 DIAGNOSIS — R062 Wheezing: Secondary | ICD-10-CM | POA: Diagnosis not present

## 2021-09-30 MED ORDER — FLUTICASONE PROPIONATE HFA 44 MCG/ACT IN AERO
INHALATION_SPRAY | RESPIRATORY_TRACT | 0 refills | Status: AC
Start: 2021-09-30 — End: ?
  Filled 2021-09-30: qty 10.6, 30d supply, fill #0

## 2021-10-07 ENCOUNTER — Other Ambulatory Visit (HOSPITAL_COMMUNITY): Payer: Self-pay

## 2021-10-07 MED ORDER — METOPROLOL SUCCINATE ER 50 MG PO TB24
ORAL_TABLET | ORAL | 3 refills | Status: DC
  Filled 2021-10-07: qty 90, 90d supply, fill #0
  Filled 2022-02-21: qty 90, 90d supply, fill #1
  Filled 2022-05-18: qty 39, 39d supply, fill #2
  Filled 2022-05-20: qty 51, 51d supply, fill #2
  Filled 2022-08-21: qty 90, 90d supply, fill #3

## 2021-10-09 DIAGNOSIS — G4733 Obstructive sleep apnea (adult) (pediatric): Secondary | ICD-10-CM | POA: Diagnosis not present

## 2021-10-16 ENCOUNTER — Other Ambulatory Visit (HOSPITAL_COMMUNITY): Payer: Self-pay

## 2021-10-17 DIAGNOSIS — S93602A Unspecified sprain of left foot, initial encounter: Secondary | ICD-10-CM | POA: Diagnosis not present

## 2022-01-15 ENCOUNTER — Other Ambulatory Visit (HOSPITAL_COMMUNITY): Payer: Self-pay

## 2022-02-05 DIAGNOSIS — N959 Unspecified menopausal and perimenopausal disorder: Secondary | ICD-10-CM | POA: Diagnosis not present

## 2022-02-21 ENCOUNTER — Other Ambulatory Visit (HOSPITAL_COMMUNITY): Payer: Self-pay

## 2022-02-21 DIAGNOSIS — M7071 Other bursitis of hip, right hip: Secondary | ICD-10-CM | POA: Diagnosis not present

## 2022-02-22 ENCOUNTER — Other Ambulatory Visit (HOSPITAL_COMMUNITY): Payer: Self-pay

## 2022-03-18 ENCOUNTER — Other Ambulatory Visit (HOSPITAL_COMMUNITY): Payer: Self-pay

## 2022-03-18 MED ORDER — BUDESONIDE-FORMOTEROL FUMARATE 80-4.5 MCG/ACT IN AERO
INHALATION_SPRAY | RESPIRATORY_TRACT | 1 refills | Status: DC
  Filled 2022-03-18: qty 10.2, 30d supply, fill #0
  Filled 2022-05-18: qty 10.2, 30d supply, fill #1

## 2022-04-18 DIAGNOSIS — Z23 Encounter for immunization: Secondary | ICD-10-CM | POA: Diagnosis not present

## 2022-05-18 ENCOUNTER — Other Ambulatory Visit (HOSPITAL_COMMUNITY): Payer: Self-pay

## 2022-05-19 ENCOUNTER — Other Ambulatory Visit (HOSPITAL_COMMUNITY): Payer: Self-pay

## 2022-05-20 ENCOUNTER — Other Ambulatory Visit (HOSPITAL_COMMUNITY): Payer: Self-pay

## 2022-05-21 ENCOUNTER — Other Ambulatory Visit (HOSPITAL_COMMUNITY): Payer: Self-pay

## 2022-05-28 DIAGNOSIS — G4733 Obstructive sleep apnea (adult) (pediatric): Secondary | ICD-10-CM | POA: Diagnosis not present

## 2022-05-30 ENCOUNTER — Other Ambulatory Visit (HOSPITAL_COMMUNITY): Payer: Self-pay

## 2022-06-09 ENCOUNTER — Other Ambulatory Visit (HOSPITAL_COMMUNITY): Payer: Self-pay

## 2022-06-09 DIAGNOSIS — Z23 Encounter for immunization: Secondary | ICD-10-CM | POA: Diagnosis not present

## 2022-06-09 DIAGNOSIS — E782 Mixed hyperlipidemia: Secondary | ICD-10-CM | POA: Diagnosis not present

## 2022-06-09 DIAGNOSIS — M2550 Pain in unspecified joint: Secondary | ICD-10-CM | POA: Diagnosis not present

## 2022-06-09 DIAGNOSIS — Z0000 Encounter for general adult medical examination without abnormal findings: Secondary | ICD-10-CM | POA: Diagnosis not present

## 2022-06-09 DIAGNOSIS — Z124 Encounter for screening for malignant neoplasm of cervix: Secondary | ICD-10-CM | POA: Diagnosis not present

## 2022-06-09 DIAGNOSIS — R8290 Unspecified abnormal findings in urine: Secondary | ICD-10-CM | POA: Diagnosis not present

## 2022-06-09 DIAGNOSIS — R7303 Prediabetes: Secondary | ICD-10-CM | POA: Diagnosis not present

## 2022-06-09 DIAGNOSIS — I10 Essential (primary) hypertension: Secondary | ICD-10-CM | POA: Diagnosis not present

## 2022-06-09 MED ORDER — DEXMETHYLPHENIDATE HCL 10 MG PO TABS
5.0000 mg | ORAL_TABLET | Freq: Two times a day (BID) | ORAL | 0 refills | Status: AC | PRN
  Filled 2022-06-09: qty 30, 15d supply, fill #0

## 2022-06-10 ENCOUNTER — Other Ambulatory Visit (HOSPITAL_COMMUNITY): Payer: Self-pay

## 2022-06-16 ENCOUNTER — Other Ambulatory Visit (HOSPITAL_COMMUNITY): Payer: Self-pay

## 2022-06-17 ENCOUNTER — Other Ambulatory Visit (HOSPITAL_COMMUNITY): Payer: Self-pay

## 2022-06-20 ENCOUNTER — Other Ambulatory Visit (HOSPITAL_COMMUNITY): Payer: Self-pay

## 2022-06-24 ENCOUNTER — Other Ambulatory Visit (HOSPITAL_COMMUNITY): Payer: Self-pay

## 2022-07-01 ENCOUNTER — Other Ambulatory Visit (HOSPITAL_COMMUNITY): Payer: Self-pay

## 2022-07-02 ENCOUNTER — Other Ambulatory Visit (HOSPITAL_COMMUNITY): Payer: Self-pay

## 2022-07-08 ENCOUNTER — Other Ambulatory Visit (HOSPITAL_COMMUNITY): Payer: Self-pay

## 2022-07-09 ENCOUNTER — Other Ambulatory Visit (HOSPITAL_COMMUNITY): Payer: Self-pay

## 2022-07-10 DIAGNOSIS — R319 Hematuria, unspecified: Secondary | ICD-10-CM | POA: Diagnosis not present

## 2022-07-10 DIAGNOSIS — R945 Abnormal results of liver function studies: Secondary | ICD-10-CM | POA: Diagnosis not present

## 2022-07-11 ENCOUNTER — Other Ambulatory Visit (HOSPITAL_COMMUNITY): Payer: Self-pay

## 2022-07-14 ENCOUNTER — Other Ambulatory Visit (HOSPITAL_COMMUNITY): Payer: Self-pay

## 2022-07-16 ENCOUNTER — Other Ambulatory Visit: Payer: Self-pay | Admitting: Family Medicine

## 2022-07-16 DIAGNOSIS — R7989 Other specified abnormal findings of blood chemistry: Secondary | ICD-10-CM

## 2022-07-22 IMAGING — MG MM DIGITAL SCREENING BILAT W/ TOMO AND CAD
8 series · 8 of 24 positions shown · non-contrast
Comparison: Previous exam(s).

CLINICAL DATA: Screening.

EXAM:
DIGITAL SCREENING BILATERAL MAMMOGRAM WITH TOMOSYNTHESIS AND CAD
TECHNIQUE: Bilateral screening digital craniocaudal and mediolateral oblique
mammograms were obtained. Bilateral screening digital breast
tomosynthesis was performed. The images were evaluated with
computer-aided detection.

[R CC synth-2D]
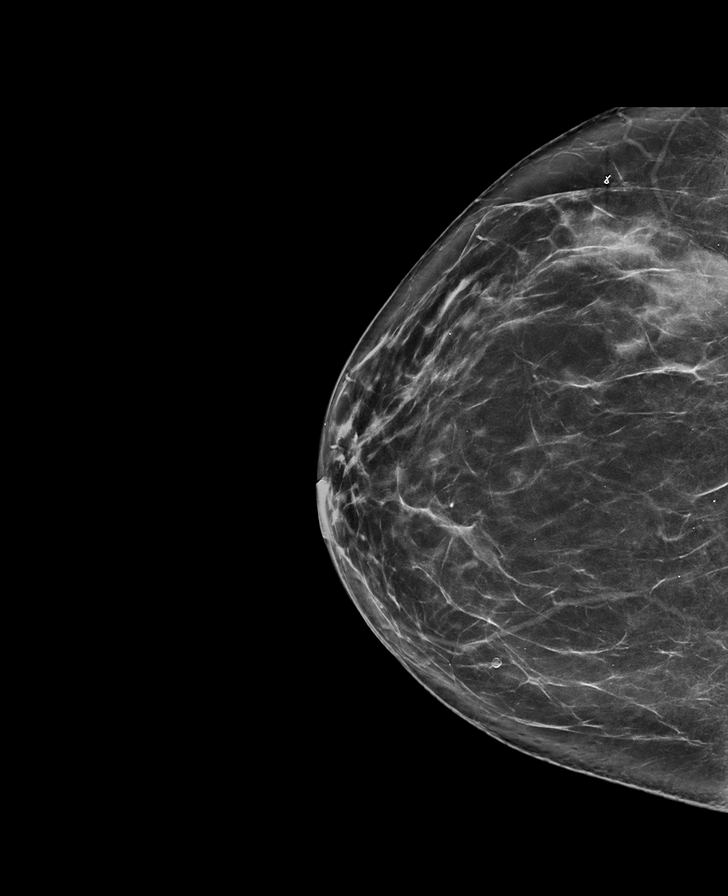

[R MLO synth-2D]
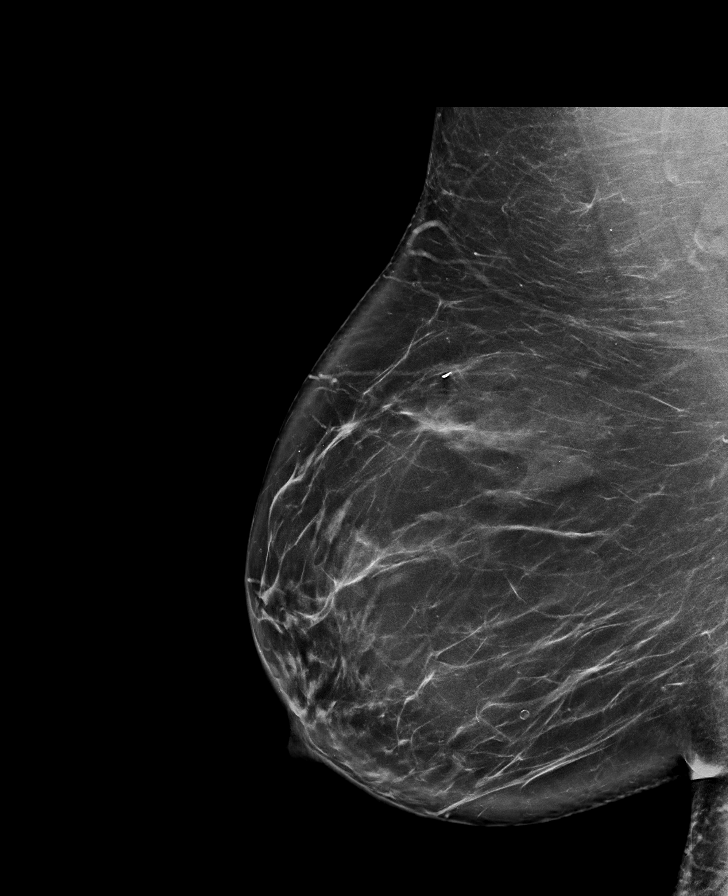

[L CC synth-2D]
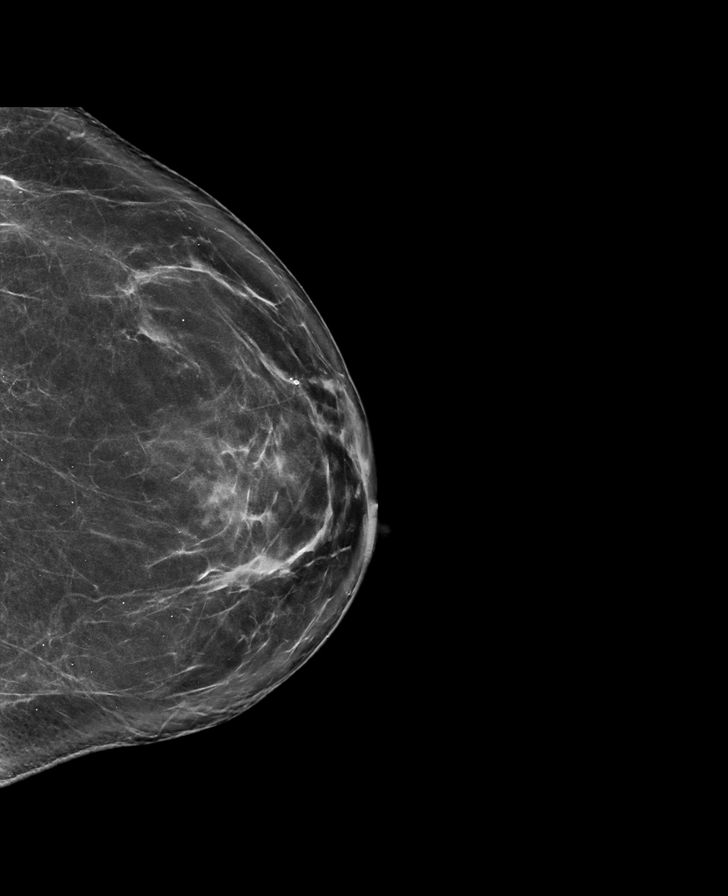

[L MLO synth-2D]
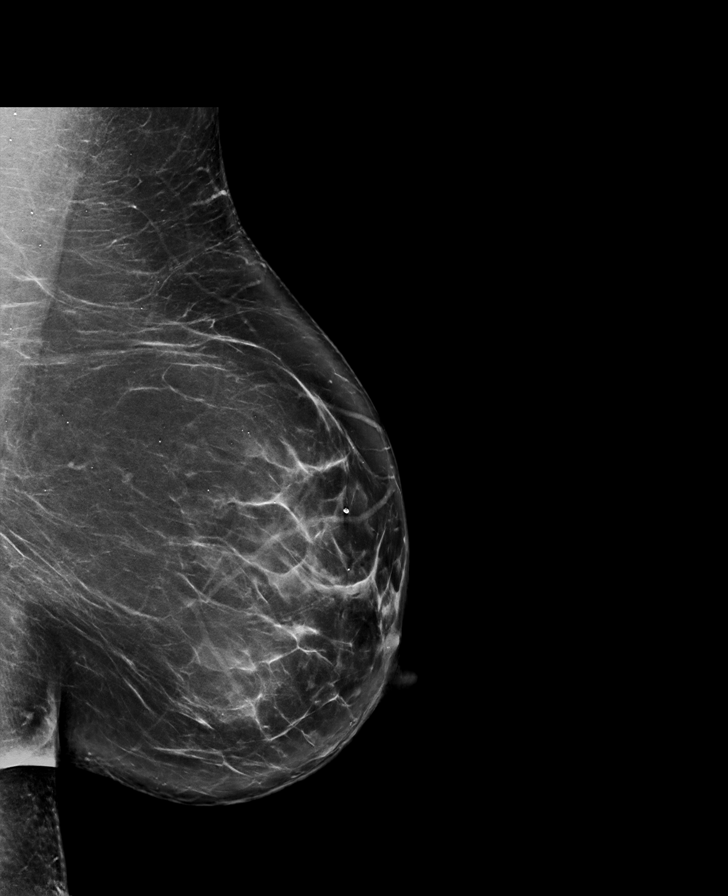

[R CC tomo · tomo slice 43/85.0]
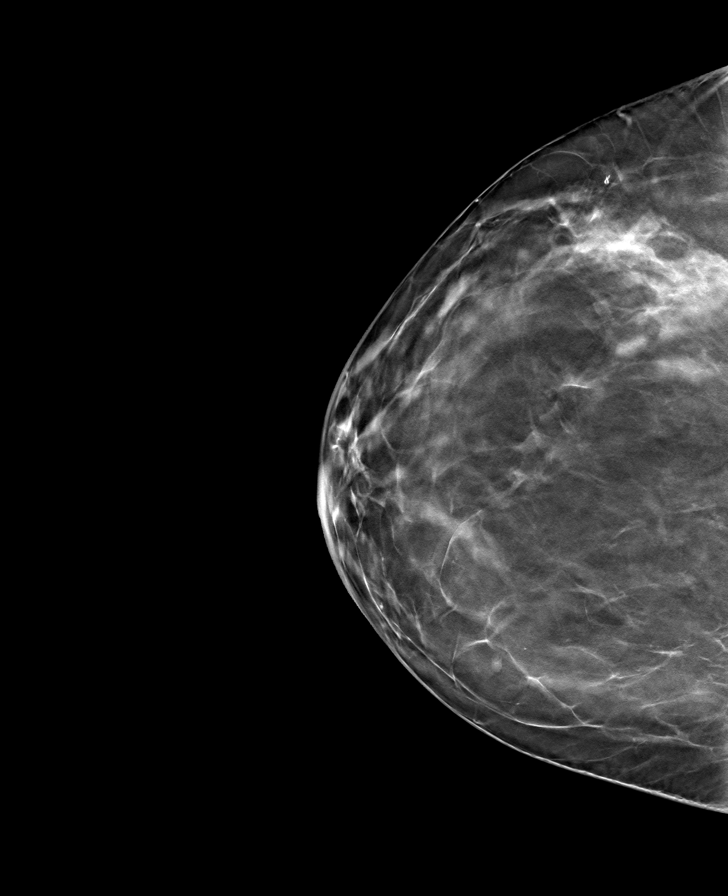

[L MLO tomo · tomo slice 48/95.0]
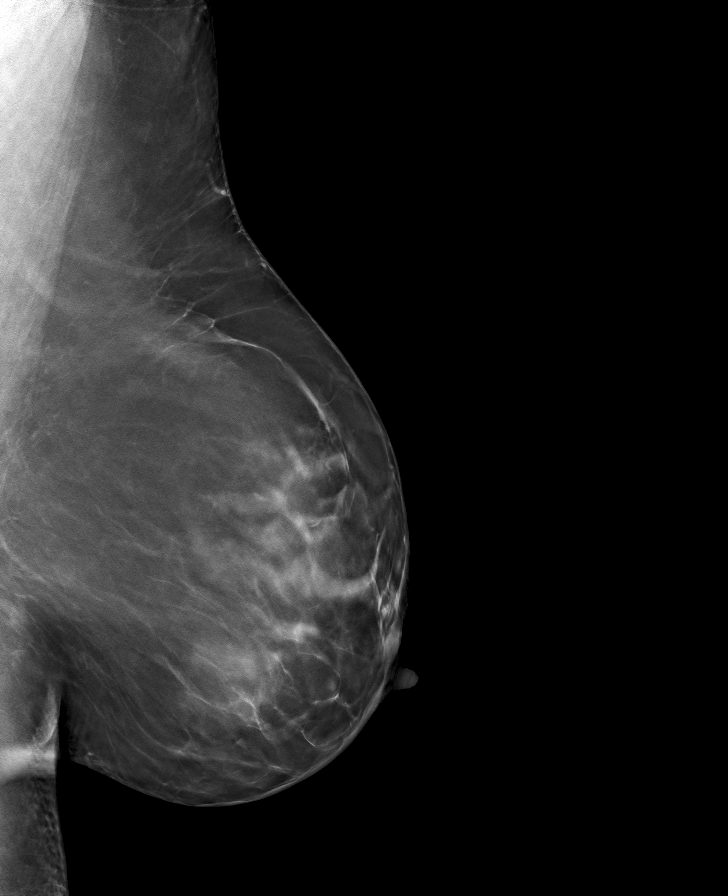

[L CC tomo · tomo slice 40/79.0]
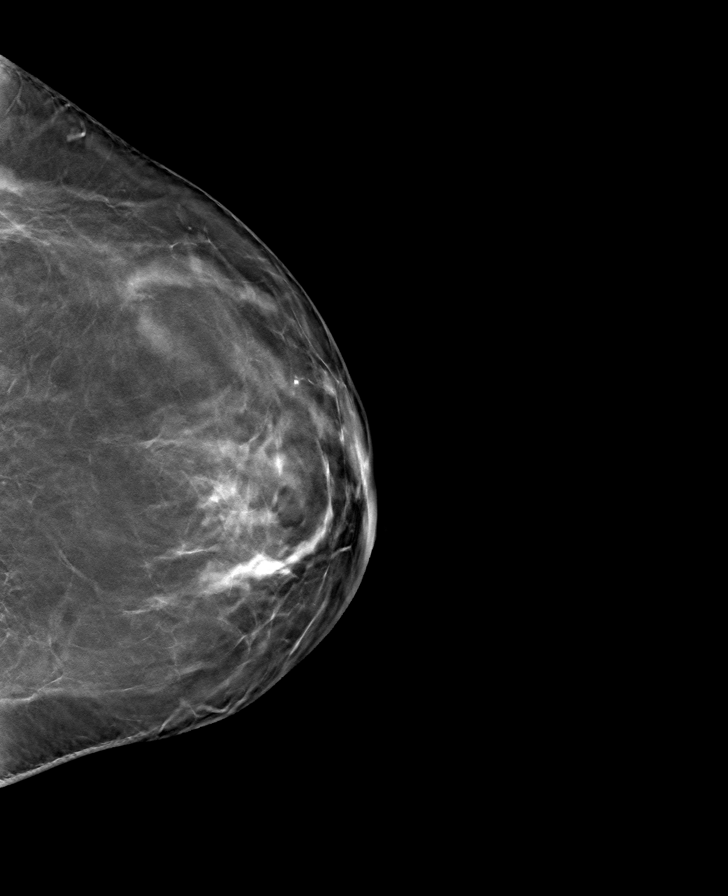

[R MLO tomo · tomo slice 51/101.0]
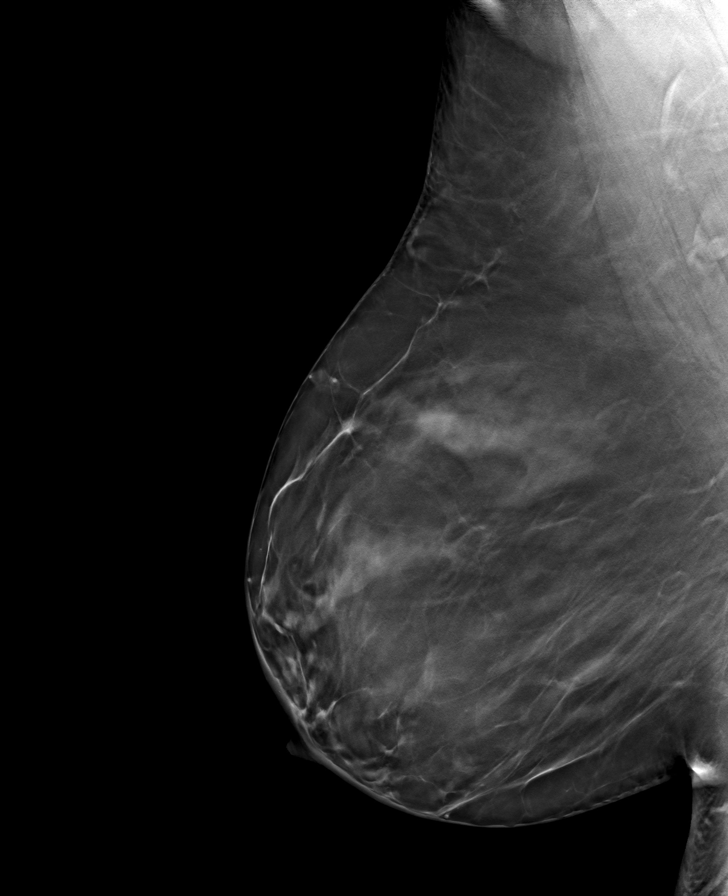

[8 of 24 positions shown; findings below may reference images not displayed]

ACR Breast Density Category b: There are scattered areas of
fibroglandular density.
FINDINGS: There are no findings suspicious for malignancy.
IMPRESSION: No mammographic evidence of malignancy. A result letter of this
screening mammogram will be mailed directly to the patient.

RECOMMENDATION:
Screening mammogram in one year. (Code:51-O-LD2)

BI-RADS CATEGORY  1: Negative.

## 2022-07-29 ENCOUNTER — Ambulatory Visit
Admission: RE | Admit: 2022-07-29 | Discharge: 2022-07-29 | Disposition: A | Discharge status: Home / Self Care | Payer: BC Managed Care – PPO | Source: Ambulatory Visit | Attending: Family Medicine | Admitting: Family Medicine

## 2022-07-29 DIAGNOSIS — K8020 Calculus of gallbladder without cholecystitis without obstruction: Secondary | ICD-10-CM | POA: Diagnosis not present

## 2022-07-29 DIAGNOSIS — R945 Abnormal results of liver function studies: Secondary | ICD-10-CM | POA: Diagnosis not present

## 2022-07-29 DIAGNOSIS — R7989 Other specified abnormal findings of blood chemistry: Secondary | ICD-10-CM

## 2022-08-26 ENCOUNTER — Other Ambulatory Visit (HOSPITAL_COMMUNITY): Payer: Self-pay

## 2022-09-29 DIAGNOSIS — M7071 Other bursitis of hip, right hip: Secondary | ICD-10-CM | POA: Diagnosis not present

## 2022-10-13 DIAGNOSIS — R748 Abnormal levels of other serum enzymes: Secondary | ICD-10-CM | POA: Diagnosis not present

## 2022-10-13 DIAGNOSIS — K760 Fatty (change of) liver, not elsewhere classified: Secondary | ICD-10-CM | POA: Diagnosis not present

## 2022-10-16 DIAGNOSIS — Z23 Encounter for immunization: Secondary | ICD-10-CM | POA: Diagnosis not present

## 2022-11-01 ENCOUNTER — Other Ambulatory Visit (HOSPITAL_COMMUNITY): Payer: Self-pay

## 2022-11-01 DIAGNOSIS — R062 Wheezing: Secondary | ICD-10-CM | POA: Diagnosis not present

## 2022-11-01 DIAGNOSIS — J0100 Acute maxillary sinusitis, unspecified: Secondary | ICD-10-CM | POA: Diagnosis not present

## 2022-11-01 MED ORDER — AMOXICILLIN 875 MG PO TABS
ORAL_TABLET | ORAL | 0 refills | Status: AC
  Filled 2022-11-01: qty 20, 10d supply, fill #0

## 2022-11-01 MED ORDER — HYDROCOD POLI-CHLORPHE POLI ER 10-8 MG/5ML PO SUER
ORAL | 0 refills | Status: AC
  Filled 2022-11-01: qty 120, 12d supply, fill #0

## 2022-11-01 MED ORDER — PREDNISONE 20 MG PO TABS
ORAL_TABLET | ORAL | 0 refills | Status: AC
  Filled 2022-11-01: qty 5, 5d supply, fill #0

## 2022-11-28 ENCOUNTER — Other Ambulatory Visit (HOSPITAL_COMMUNITY): Payer: Self-pay

## 2022-11-28 MED ORDER — METOPROLOL SUCCINATE ER 50 MG PO TB24
50.0000 mg | ORAL_TABLET | Freq: Every day | ORAL | 0 refills | Status: DC
  Filled 2022-11-28: qty 90, 90d supply, fill #0

## 2022-11-29 ENCOUNTER — Other Ambulatory Visit (HOSPITAL_COMMUNITY): Payer: Self-pay

## 2022-12-02 DIAGNOSIS — M25572 Pain in left ankle and joints of left foot: Secondary | ICD-10-CM | POA: Diagnosis not present

## 2022-12-02 DIAGNOSIS — M25562 Pain in left knee: Secondary | ICD-10-CM | POA: Diagnosis not present

## 2022-12-09 ENCOUNTER — Other Ambulatory Visit (HOSPITAL_COMMUNITY): Payer: Self-pay

## 2022-12-09 DIAGNOSIS — E669 Obesity, unspecified: Secondary | ICD-10-CM | POA: Diagnosis not present

## 2022-12-09 DIAGNOSIS — E782 Mixed hyperlipidemia: Secondary | ICD-10-CM | POA: Diagnosis not present

## 2022-12-09 DIAGNOSIS — I10 Essential (primary) hypertension: Secondary | ICD-10-CM | POA: Diagnosis not present

## 2022-12-09 DIAGNOSIS — R7303 Prediabetes: Secondary | ICD-10-CM | POA: Diagnosis not present

## 2022-12-09 MED ORDER — PREDNISONE 10 MG PO TABS
ORAL_TABLET | ORAL | 0 refills | Status: DC
  Filled 2022-12-09: qty 34, 16d supply, fill #0

## 2022-12-09 MED ORDER — BUPROPION HCL ER (XL) 150 MG PO TB24
150.0000 mg | ORAL_TABLET | Freq: Every day | ORAL | 6 refills | Status: AC
  Filled 2022-12-09: qty 30, 30d supply, fill #0
  Filled 2023-01-03: qty 30, 30d supply, fill #1
  Filled 2023-02-20: qty 30, 30d supply, fill #2
  Filled 2023-04-25: qty 30, 30d supply, fill #3
  Filled 2023-11-18: qty 30, 30d supply, fill #4

## 2022-12-24 ENCOUNTER — Other Ambulatory Visit (HOSPITAL_COMMUNITY): Payer: Self-pay

## 2022-12-24 DIAGNOSIS — M1990 Unspecified osteoarthritis, unspecified site: Secondary | ICD-10-CM | POA: Diagnosis not present

## 2022-12-24 DIAGNOSIS — M2550 Pain in unspecified joint: Secondary | ICD-10-CM | POA: Diagnosis not present

## 2022-12-24 DIAGNOSIS — Z872 Personal history of diseases of the skin and subcutaneous tissue: Secondary | ICD-10-CM | POA: Diagnosis not present

## 2022-12-24 DIAGNOSIS — L4050 Arthropathic psoriasis, unspecified: Secondary | ICD-10-CM | POA: Diagnosis not present

## 2022-12-24 MED ORDER — PREDNISONE 10 MG PO TABS
10.0000 mg | ORAL_TABLET | Freq: Every day | ORAL | 0 refills | Status: DC
  Filled 2022-12-24: qty 30, 30d supply, fill #0

## 2022-12-26 ENCOUNTER — Other Ambulatory Visit (HOSPITAL_COMMUNITY): Payer: Self-pay

## 2023-01-13 ENCOUNTER — Other Ambulatory Visit (HOSPITAL_COMMUNITY): Payer: Self-pay

## 2023-01-15 DIAGNOSIS — L4050 Arthropathic psoriasis, unspecified: Secondary | ICD-10-CM | POA: Diagnosis not present

## 2023-01-23 ENCOUNTER — Other Ambulatory Visit (HOSPITAL_COMMUNITY): Payer: Self-pay

## 2023-01-26 ENCOUNTER — Other Ambulatory Visit (HOSPITAL_COMMUNITY): Payer: Self-pay

## 2023-01-27 ENCOUNTER — Other Ambulatory Visit (HOSPITAL_COMMUNITY): Payer: Self-pay

## 2023-01-27 DIAGNOSIS — L4050 Arthropathic psoriasis, unspecified: Secondary | ICD-10-CM | POA: Diagnosis not present

## 2023-01-27 DIAGNOSIS — M79671 Pain in right foot: Secondary | ICD-10-CM | POA: Diagnosis not present

## 2023-01-27 DIAGNOSIS — Z79899 Other long term (current) drug therapy: Secondary | ICD-10-CM | POA: Diagnosis not present

## 2023-01-27 DIAGNOSIS — M79672 Pain in left foot: Secondary | ICD-10-CM | POA: Diagnosis not present

## 2023-01-27 DIAGNOSIS — M1990 Unspecified osteoarthritis, unspecified site: Secondary | ICD-10-CM | POA: Diagnosis not present

## 2023-01-27 DIAGNOSIS — M549 Dorsalgia, unspecified: Secondary | ICD-10-CM | POA: Diagnosis not present

## 2023-01-27 DIAGNOSIS — M79642 Pain in left hand: Secondary | ICD-10-CM | POA: Diagnosis not present

## 2023-01-27 DIAGNOSIS — M79641 Pain in right hand: Secondary | ICD-10-CM | POA: Diagnosis not present

## 2023-01-27 DIAGNOSIS — Z872 Personal history of diseases of the skin and subcutaneous tissue: Secondary | ICD-10-CM | POA: Diagnosis not present

## 2023-01-27 MED ORDER — PREDNISONE 10 MG PO TABS
10.0000 mg | ORAL_TABLET | Freq: Every day | ORAL | 2 refills | Status: AC
  Filled 2023-01-27: qty 30, 30d supply, fill #0
  Filled 2023-04-25: qty 30, 30d supply, fill #1
  Filled 2023-11-18: qty 30, 30d supply, fill #2

## 2023-01-28 ENCOUNTER — Other Ambulatory Visit (HOSPITAL_COMMUNITY): Payer: Self-pay

## 2023-01-28 MED ORDER — PREDNISONE 10 MG PO TABS
10.0000 mg | ORAL_TABLET | Freq: Every day | ORAL | 0 refills | Status: DC
  Filled 2023-01-28 – 2023-02-20 (×2): qty 30, 30d supply, fill #0

## 2023-01-30 DIAGNOSIS — L4050 Arthropathic psoriasis, unspecified: Secondary | ICD-10-CM | POA: Diagnosis not present

## 2023-02-12 ENCOUNTER — Other Ambulatory Visit (HOSPITAL_COMMUNITY): Payer: Self-pay

## 2023-02-12 DIAGNOSIS — S61243A Puncture wound with foreign body of left middle finger without damage to nail, initial encounter: Secondary | ICD-10-CM | POA: Diagnosis not present

## 2023-02-12 DIAGNOSIS — L089 Local infection of the skin and subcutaneous tissue, unspecified: Secondary | ICD-10-CM | POA: Diagnosis not present

## 2023-02-12 DIAGNOSIS — Z23 Encounter for immunization: Secondary | ICD-10-CM | POA: Diagnosis not present

## 2023-02-12 MED ORDER — CEPHALEXIN 500 MG PO CAPS
1000.0000 mg | ORAL_CAPSULE | Freq: Two times a day (BID) | ORAL | 0 refills | Status: AC
  Filled 2023-02-12: qty 20, 5d supply, fill #0

## 2023-02-19 DIAGNOSIS — L4050 Arthropathic psoriasis, unspecified: Secondary | ICD-10-CM | POA: Diagnosis not present

## 2023-02-20 ENCOUNTER — Other Ambulatory Visit: Payer: Self-pay

## 2023-02-20 ENCOUNTER — Other Ambulatory Visit (HOSPITAL_COMMUNITY): Payer: Self-pay

## 2023-02-20 MED ORDER — METOPROLOL SUCCINATE ER 50 MG PO TB24
50.0000 mg | ORAL_TABLET | Freq: Every day | ORAL | 1 refills | Status: DC
  Filled 2023-02-20: qty 90, 90d supply, fill #0
  Filled 2023-05-23: qty 90, 90d supply, fill #1

## 2023-02-20 MED ORDER — VENLAFAXINE HCL ER 75 MG PO CP24
150.0000 mg | ORAL_CAPSULE | Freq: Every day | ORAL | 1 refills | Status: DC
  Filled 2023-02-20: qty 180, 90d supply, fill #0
  Filled 2023-07-29: qty 180, 90d supply, fill #1

## 2023-03-03 ENCOUNTER — Other Ambulatory Visit (HOSPITAL_COMMUNITY): Payer: Self-pay

## 2023-03-03 MED ORDER — DEXMETHYLPHENIDATE HCL 10 MG PO TABS
5.0000 mg | ORAL_TABLET | Freq: Two times a day (BID) | ORAL | 0 refills | Status: AC | PRN
  Filled 2023-03-03: qty 30, 15d supply, fill #0

## 2023-03-05 ENCOUNTER — Other Ambulatory Visit (HOSPITAL_COMMUNITY): Payer: Self-pay

## 2023-03-19 DIAGNOSIS — L4050 Arthropathic psoriasis, unspecified: Secondary | ICD-10-CM | POA: Diagnosis not present

## 2023-03-28 ENCOUNTER — Other Ambulatory Visit (HOSPITAL_COMMUNITY): Payer: Self-pay

## 2023-03-30 ENCOUNTER — Other Ambulatory Visit (HOSPITAL_COMMUNITY): Payer: Self-pay

## 2023-03-30 MED ORDER — PREDNISONE 10 MG PO TABS
10.0000 mg | ORAL_TABLET | Freq: Every day | ORAL | 0 refills | Status: DC
  Filled 2023-03-30: qty 30, 30d supply, fill #0

## 2023-03-31 ENCOUNTER — Other Ambulatory Visit (HOSPITAL_COMMUNITY): Payer: Self-pay

## 2023-04-01 DIAGNOSIS — Z23 Encounter for immunization: Secondary | ICD-10-CM | POA: Diagnosis not present

## 2023-04-15 DIAGNOSIS — K760 Fatty (change of) liver, not elsewhere classified: Secondary | ICD-10-CM | POA: Diagnosis not present

## 2023-04-15 DIAGNOSIS — R748 Abnormal levels of other serum enzymes: Secondary | ICD-10-CM | POA: Diagnosis not present

## 2023-04-16 DIAGNOSIS — L4050 Arthropathic psoriasis, unspecified: Secondary | ICD-10-CM | POA: Diagnosis not present

## 2023-04-23 DIAGNOSIS — R748 Abnormal levels of other serum enzymes: Secondary | ICD-10-CM | POA: Diagnosis not present

## 2023-04-23 DIAGNOSIS — K760 Fatty (change of) liver, not elsewhere classified: Secondary | ICD-10-CM | POA: Diagnosis not present

## 2023-04-25 ENCOUNTER — Other Ambulatory Visit (HOSPITAL_COMMUNITY): Payer: Self-pay

## 2023-04-27 ENCOUNTER — Other Ambulatory Visit (HOSPITAL_COMMUNITY): Payer: Self-pay

## 2023-04-27 ENCOUNTER — Other Ambulatory Visit: Payer: Self-pay

## 2023-04-27 MED ORDER — PREDNISONE 10 MG PO TABS
10.0000 mg | ORAL_TABLET | Freq: Every day | ORAL | 0 refills | Status: AC
  Filled 2023-04-27: qty 30, 30d supply, fill #0

## 2023-04-28 ENCOUNTER — Other Ambulatory Visit (HOSPITAL_COMMUNITY): Payer: Self-pay

## 2023-04-28 MED ORDER — DEXMETHYLPHENIDATE HCL 10 MG PO TABS
5.0000 mg | ORAL_TABLET | Freq: Two times a day (BID) | ORAL | 0 refills | Status: AC | PRN
  Filled 2023-04-28: qty 30, 15d supply, fill #0

## 2023-04-29 ENCOUNTER — Other Ambulatory Visit (HOSPITAL_COMMUNITY): Payer: Self-pay

## 2023-04-29 ENCOUNTER — Other Ambulatory Visit (HOSPITAL_BASED_OUTPATIENT_CLINIC_OR_DEPARTMENT_OTHER): Payer: Self-pay

## 2023-04-29 DIAGNOSIS — L4050 Arthropathic psoriasis, unspecified: Secondary | ICD-10-CM | POA: Diagnosis not present

## 2023-04-29 DIAGNOSIS — Z79899 Other long term (current) drug therapy: Secondary | ICD-10-CM | POA: Diagnosis not present

## 2023-04-29 DIAGNOSIS — M1990 Unspecified osteoarthritis, unspecified site: Secondary | ICD-10-CM | POA: Diagnosis not present

## 2023-04-29 DIAGNOSIS — Z872 Personal history of diseases of the skin and subcutaneous tissue: Secondary | ICD-10-CM | POA: Diagnosis not present

## 2023-04-29 MED ORDER — PREDNISONE 5 MG PO TABS
ORAL_TABLET | ORAL | 0 refills | Status: DC
  Filled 2023-04-29: qty 84, 77d supply, fill #0

## 2023-05-01 ENCOUNTER — Other Ambulatory Visit (HOSPITAL_COMMUNITY): Payer: Self-pay

## 2023-05-01 MED ORDER — VALACYCLOVIR HCL 1 G PO TABS
2000.0000 mg | ORAL_TABLET | Freq: Two times a day (BID) | ORAL | 0 refills | Status: AC
  Filled 2023-05-01: qty 12, 3d supply, fill #0

## 2023-05-14 DIAGNOSIS — M62830 Muscle spasm of back: Secondary | ICD-10-CM | POA: Diagnosis not present

## 2023-05-14 DIAGNOSIS — M9901 Segmental and somatic dysfunction of cervical region: Secondary | ICD-10-CM | POA: Diagnosis not present

## 2023-05-14 DIAGNOSIS — M9903 Segmental and somatic dysfunction of lumbar region: Secondary | ICD-10-CM | POA: Diagnosis not present

## 2023-05-14 DIAGNOSIS — M545 Low back pain, unspecified: Secondary | ICD-10-CM | POA: Diagnosis not present

## 2023-05-19 DIAGNOSIS — M545 Low back pain, unspecified: Secondary | ICD-10-CM | POA: Diagnosis not present

## 2023-05-19 DIAGNOSIS — M62830 Muscle spasm of back: Secondary | ICD-10-CM | POA: Diagnosis not present

## 2023-05-19 DIAGNOSIS — M9901 Segmental and somatic dysfunction of cervical region: Secondary | ICD-10-CM | POA: Diagnosis not present

## 2023-05-19 DIAGNOSIS — M9903 Segmental and somatic dysfunction of lumbar region: Secondary | ICD-10-CM | POA: Diagnosis not present

## 2023-05-21 DIAGNOSIS — M9903 Segmental and somatic dysfunction of lumbar region: Secondary | ICD-10-CM | POA: Diagnosis not present

## 2023-05-21 DIAGNOSIS — L4050 Arthropathic psoriasis, unspecified: Secondary | ICD-10-CM | POA: Diagnosis not present

## 2023-05-21 DIAGNOSIS — M545 Low back pain, unspecified: Secondary | ICD-10-CM | POA: Diagnosis not present

## 2023-05-21 DIAGNOSIS — M62830 Muscle spasm of back: Secondary | ICD-10-CM | POA: Diagnosis not present

## 2023-05-21 DIAGNOSIS — M9901 Segmental and somatic dysfunction of cervical region: Secondary | ICD-10-CM | POA: Diagnosis not present

## 2023-05-23 ENCOUNTER — Other Ambulatory Visit (HOSPITAL_COMMUNITY): Payer: Self-pay

## 2023-05-25 ENCOUNTER — Other Ambulatory Visit: Payer: Self-pay

## 2023-05-25 DIAGNOSIS — M9901 Segmental and somatic dysfunction of cervical region: Secondary | ICD-10-CM | POA: Diagnosis not present

## 2023-05-25 DIAGNOSIS — M62830 Muscle spasm of back: Secondary | ICD-10-CM | POA: Diagnosis not present

## 2023-05-25 DIAGNOSIS — M9903 Segmental and somatic dysfunction of lumbar region: Secondary | ICD-10-CM | POA: Diagnosis not present

## 2023-05-25 DIAGNOSIS — M545 Low back pain, unspecified: Secondary | ICD-10-CM | POA: Diagnosis not present

## 2023-05-26 ENCOUNTER — Other Ambulatory Visit (HOSPITAL_COMMUNITY): Payer: Self-pay

## 2023-05-27 ENCOUNTER — Other Ambulatory Visit (HOSPITAL_COMMUNITY): Payer: Self-pay

## 2023-05-27 DIAGNOSIS — M9901 Segmental and somatic dysfunction of cervical region: Secondary | ICD-10-CM | POA: Diagnosis not present

## 2023-05-27 DIAGNOSIS — M62830 Muscle spasm of back: Secondary | ICD-10-CM | POA: Diagnosis not present

## 2023-05-27 DIAGNOSIS — M9903 Segmental and somatic dysfunction of lumbar region: Secondary | ICD-10-CM | POA: Diagnosis not present

## 2023-05-27 DIAGNOSIS — M545 Low back pain, unspecified: Secondary | ICD-10-CM | POA: Diagnosis not present

## 2023-05-28 DIAGNOSIS — M9903 Segmental and somatic dysfunction of lumbar region: Secondary | ICD-10-CM | POA: Diagnosis not present

## 2023-05-28 DIAGNOSIS — M545 Low back pain, unspecified: Secondary | ICD-10-CM | POA: Diagnosis not present

## 2023-05-28 DIAGNOSIS — M9901 Segmental and somatic dysfunction of cervical region: Secondary | ICD-10-CM | POA: Diagnosis not present

## 2023-05-28 DIAGNOSIS — M62830 Muscle spasm of back: Secondary | ICD-10-CM | POA: Diagnosis not present

## 2023-06-01 DIAGNOSIS — M545 Low back pain, unspecified: Secondary | ICD-10-CM | POA: Diagnosis not present

## 2023-06-01 DIAGNOSIS — M9901 Segmental and somatic dysfunction of cervical region: Secondary | ICD-10-CM | POA: Diagnosis not present

## 2023-06-01 DIAGNOSIS — M62830 Muscle spasm of back: Secondary | ICD-10-CM | POA: Diagnosis not present

## 2023-06-01 DIAGNOSIS — M9903 Segmental and somatic dysfunction of lumbar region: Secondary | ICD-10-CM | POA: Diagnosis not present

## 2023-06-02 DIAGNOSIS — M9901 Segmental and somatic dysfunction of cervical region: Secondary | ICD-10-CM | POA: Diagnosis not present

## 2023-06-02 DIAGNOSIS — M62830 Muscle spasm of back: Secondary | ICD-10-CM | POA: Diagnosis not present

## 2023-06-02 DIAGNOSIS — M545 Low back pain, unspecified: Secondary | ICD-10-CM | POA: Diagnosis not present

## 2023-06-02 DIAGNOSIS — M9903 Segmental and somatic dysfunction of lumbar region: Secondary | ICD-10-CM | POA: Diagnosis not present

## 2023-06-03 DIAGNOSIS — M62830 Muscle spasm of back: Secondary | ICD-10-CM | POA: Diagnosis not present

## 2023-06-03 DIAGNOSIS — M545 Low back pain, unspecified: Secondary | ICD-10-CM | POA: Diagnosis not present

## 2023-06-03 DIAGNOSIS — M9903 Segmental and somatic dysfunction of lumbar region: Secondary | ICD-10-CM | POA: Diagnosis not present

## 2023-06-03 DIAGNOSIS — M9901 Segmental and somatic dysfunction of cervical region: Secondary | ICD-10-CM | POA: Diagnosis not present

## 2023-06-08 DIAGNOSIS — M545 Low back pain, unspecified: Secondary | ICD-10-CM | POA: Diagnosis not present

## 2023-06-08 DIAGNOSIS — M9903 Segmental and somatic dysfunction of lumbar region: Secondary | ICD-10-CM | POA: Diagnosis not present

## 2023-06-08 DIAGNOSIS — M62830 Muscle spasm of back: Secondary | ICD-10-CM | POA: Diagnosis not present

## 2023-06-08 DIAGNOSIS — M9901 Segmental and somatic dysfunction of cervical region: Secondary | ICD-10-CM | POA: Diagnosis not present

## 2023-06-10 DIAGNOSIS — M545 Low back pain, unspecified: Secondary | ICD-10-CM | POA: Diagnosis not present

## 2023-06-10 DIAGNOSIS — M9901 Segmental and somatic dysfunction of cervical region: Secondary | ICD-10-CM | POA: Diagnosis not present

## 2023-06-10 DIAGNOSIS — M62830 Muscle spasm of back: Secondary | ICD-10-CM | POA: Diagnosis not present

## 2023-06-10 DIAGNOSIS — M9903 Segmental and somatic dysfunction of lumbar region: Secondary | ICD-10-CM | POA: Diagnosis not present

## 2023-06-11 DIAGNOSIS — M62838 Other muscle spasm: Secondary | ICD-10-CM | POA: Diagnosis not present

## 2023-06-11 DIAGNOSIS — M9903 Segmental and somatic dysfunction of lumbar region: Secondary | ICD-10-CM | POA: Diagnosis not present

## 2023-06-11 DIAGNOSIS — M62830 Muscle spasm of back: Secondary | ICD-10-CM | POA: Diagnosis not present

## 2023-06-11 DIAGNOSIS — M9901 Segmental and somatic dysfunction of cervical region: Secondary | ICD-10-CM | POA: Diagnosis not present

## 2023-06-11 DIAGNOSIS — M545 Low back pain, unspecified: Secondary | ICD-10-CM | POA: Diagnosis not present

## 2023-06-16 ENCOUNTER — Other Ambulatory Visit (HOSPITAL_COMMUNITY): Payer: Self-pay

## 2023-06-16 DIAGNOSIS — I10 Essential (primary) hypertension: Secondary | ICD-10-CM | POA: Diagnosis not present

## 2023-06-16 DIAGNOSIS — Z0000 Encounter for general adult medical examination without abnormal findings: Secondary | ICD-10-CM | POA: Diagnosis not present

## 2023-06-16 MED ORDER — AZITHROMYCIN 250 MG PO TABS
ORAL_TABLET | ORAL | 0 refills | Status: AC
  Filled 2023-06-16: qty 6, 5d supply, fill #0

## 2023-06-17 ENCOUNTER — Other Ambulatory Visit: Payer: Self-pay | Admitting: Family Medicine

## 2023-06-17 DIAGNOSIS — Z1231 Encounter for screening mammogram for malignant neoplasm of breast: Secondary | ICD-10-CM

## 2023-06-18 ENCOUNTER — Ambulatory Visit: Payer: BC Managed Care – PPO

## 2023-07-06 ENCOUNTER — Other Ambulatory Visit (HOSPITAL_COMMUNITY): Payer: Self-pay

## 2023-07-06 DIAGNOSIS — L4050 Arthropathic psoriasis, unspecified: Secondary | ICD-10-CM | POA: Diagnosis not present

## 2023-07-06 MED ORDER — PREDNISONE 5 MG PO TABS
5.0000 mg | ORAL_TABLET | Freq: Every day | ORAL | 0 refills | Status: DC
  Filled 2023-07-06: qty 30, 30d supply, fill #0

## 2023-07-09 ENCOUNTER — Other Ambulatory Visit (HOSPITAL_COMMUNITY): Payer: Self-pay

## 2023-07-14 ENCOUNTER — Ambulatory Visit
Admission: RE | Admit: 2023-07-14 | Discharge: 2023-07-14 | Disposition: A | Discharge status: Home / Self Care | Payer: BC Managed Care – PPO | Source: Ambulatory Visit | Attending: Family Medicine | Admitting: Family Medicine

## 2023-07-14 DIAGNOSIS — Z1231 Encounter for screening mammogram for malignant neoplasm of breast: Secondary | ICD-10-CM | POA: Diagnosis not present

## 2023-07-29 ENCOUNTER — Other Ambulatory Visit (HOSPITAL_COMMUNITY): Payer: Self-pay

## 2023-07-29 ENCOUNTER — Other Ambulatory Visit: Payer: Self-pay

## 2023-07-29 MED ORDER — BUDESONIDE-FORMOTEROL FUMARATE 80-4.5 MCG/ACT IN AERO
INHALATION_SPRAY | RESPIRATORY_TRACT | 1 refills | Status: AC
  Filled 2023-07-29: qty 10.2, 30d supply, fill #0

## 2023-07-30 ENCOUNTER — Other Ambulatory Visit (HOSPITAL_COMMUNITY): Payer: Self-pay

## 2023-07-30 DIAGNOSIS — J0140 Acute pansinusitis, unspecified: Secondary | ICD-10-CM | POA: Diagnosis not present

## 2023-07-30 DIAGNOSIS — J209 Acute bronchitis, unspecified: Secondary | ICD-10-CM | POA: Diagnosis not present

## 2023-07-30 MED ORDER — FLUCONAZOLE 150 MG PO TABS
ORAL_TABLET | ORAL | 0 refills | Status: AC
  Filled 2023-07-30: qty 2, 6d supply, fill #0

## 2023-07-30 MED ORDER — BENZONATATE 200 MG PO CAPS
200.0000 mg | ORAL_CAPSULE | Freq: Three times a day (TID) | ORAL | 0 refills | Status: AC
  Filled 2023-07-30: qty 30, 10d supply, fill #0

## 2023-07-30 MED ORDER — AMOXICILLIN-POT CLAVULANATE 875-125 MG PO TABS
1.0000 | ORAL_TABLET | Freq: Two times a day (BID) | ORAL | 0 refills | Status: AC
  Filled 2023-07-30: qty 14, 7d supply, fill #0

## 2023-07-31 ENCOUNTER — Other Ambulatory Visit (HOSPITAL_COMMUNITY): Payer: Self-pay

## 2023-07-31 MED ORDER — PREDNISONE 5 MG PO TABS
ORAL_TABLET | ORAL | 0 refills | Status: AC
  Filled 2023-07-31: qty 42, 12d supply, fill #0

## 2023-08-05 ENCOUNTER — Other Ambulatory Visit (HOSPITAL_COMMUNITY): Payer: Self-pay

## 2023-08-19 ENCOUNTER — Other Ambulatory Visit (HOSPITAL_COMMUNITY): Payer: Self-pay

## 2023-08-19 DIAGNOSIS — L821 Other seborrheic keratosis: Secondary | ICD-10-CM | POA: Diagnosis not present

## 2023-08-19 DIAGNOSIS — L4050 Arthropathic psoriasis, unspecified: Secondary | ICD-10-CM | POA: Diagnosis not present

## 2023-08-19 MED ORDER — METOPROLOL SUCCINATE ER 50 MG PO TB24
50.0000 mg | ORAL_TABLET | Freq: Every day | ORAL | 1 refills | Status: DC
  Filled 2023-08-19: qty 90, 90d supply, fill #0
  Filled 2023-11-18: qty 90, 90d supply, fill #1

## 2023-09-01 DIAGNOSIS — M1990 Unspecified osteoarthritis, unspecified site: Secondary | ICD-10-CM | POA: Diagnosis not present

## 2023-09-01 DIAGNOSIS — L4050 Arthropathic psoriasis, unspecified: Secondary | ICD-10-CM | POA: Diagnosis not present

## 2023-09-01 DIAGNOSIS — Z79899 Other long term (current) drug therapy: Secondary | ICD-10-CM | POA: Diagnosis not present

## 2023-09-01 DIAGNOSIS — Z872 Personal history of diseases of the skin and subcutaneous tissue: Secondary | ICD-10-CM | POA: Diagnosis not present

## 2023-09-15 ENCOUNTER — Other Ambulatory Visit (HOSPITAL_COMMUNITY): Payer: Self-pay

## 2023-09-20 DIAGNOSIS — G4733 Obstructive sleep apnea (adult) (pediatric): Secondary | ICD-10-CM | POA: Diagnosis not present

## 2023-10-19 ENCOUNTER — Other Ambulatory Visit (HOSPITAL_COMMUNITY): Payer: Self-pay

## 2023-10-24 ENCOUNTER — Other Ambulatory Visit (HOSPITAL_COMMUNITY): Payer: Self-pay

## 2023-10-24 MED ORDER — DEXMETHYLPHENIDATE HCL 10 MG PO TABS
5.0000 mg | ORAL_TABLET | Freq: Two times a day (BID) | ORAL | 0 refills | Status: DC | PRN
Start: 2023-10-24 — End: 2023-12-29
  Filled 2023-10-24: qty 30, 15d supply, fill #0

## 2023-10-26 ENCOUNTER — Other Ambulatory Visit (HOSPITAL_COMMUNITY): Payer: Self-pay

## 2023-10-28 ENCOUNTER — Other Ambulatory Visit (HOSPITAL_COMMUNITY): Payer: Self-pay

## 2023-11-16 ENCOUNTER — Other Ambulatory Visit (HOSPITAL_COMMUNITY): Payer: Self-pay

## 2023-11-20 DIAGNOSIS — G4733 Obstructive sleep apnea (adult) (pediatric): Secondary | ICD-10-CM | POA: Diagnosis not present

## 2023-12-08 DIAGNOSIS — D229 Melanocytic nevi, unspecified: Secondary | ICD-10-CM | POA: Diagnosis not present

## 2023-12-08 DIAGNOSIS — L814 Other melanin hyperpigmentation: Secondary | ICD-10-CM | POA: Diagnosis not present

## 2023-12-08 DIAGNOSIS — L821 Other seborrheic keratosis: Secondary | ICD-10-CM | POA: Diagnosis not present

## 2023-12-08 DIAGNOSIS — L578 Other skin changes due to chronic exposure to nonionizing radiation: Secondary | ICD-10-CM | POA: Diagnosis not present

## 2023-12-29 ENCOUNTER — Other Ambulatory Visit (HOSPITAL_COMMUNITY): Payer: Self-pay

## 2023-12-29 DIAGNOSIS — I10 Essential (primary) hypertension: Secondary | ICD-10-CM | POA: Diagnosis not present

## 2023-12-29 DIAGNOSIS — Z23 Encounter for immunization: Secondary | ICD-10-CM | POA: Diagnosis not present

## 2023-12-29 DIAGNOSIS — E782 Mixed hyperlipidemia: Secondary | ICD-10-CM | POA: Diagnosis not present

## 2023-12-29 DIAGNOSIS — R7303 Prediabetes: Secondary | ICD-10-CM | POA: Diagnosis not present

## 2023-12-29 DIAGNOSIS — K7581 Nonalcoholic steatohepatitis (NASH): Secondary | ICD-10-CM | POA: Diagnosis not present

## 2023-12-29 MED ORDER — VALACYCLOVIR HCL 1 G PO TABS
2000.0000 mg | ORAL_TABLET | Freq: Two times a day (BID) | ORAL | 0 refills | Status: AC | PRN
  Filled 2023-12-29: qty 30, 8d supply, fill #0

## 2023-12-29 MED ORDER — DEXMETHYLPHENIDATE HCL 10 MG PO TABS
5.0000 mg | ORAL_TABLET | Freq: Two times a day (BID) | ORAL | 0 refills | Status: DC | PRN
  Filled 2023-12-29 – 2024-02-23 (×2): qty 30, 15d supply, fill #0

## 2024-01-12 ENCOUNTER — Other Ambulatory Visit (HOSPITAL_COMMUNITY): Payer: Self-pay

## 2024-01-26 ENCOUNTER — Other Ambulatory Visit (HOSPITAL_COMMUNITY): Payer: Self-pay

## 2024-01-26 MED ORDER — VENLAFAXINE HCL ER 75 MG PO CP24
150.0000 mg | ORAL_CAPSULE | Freq: Every day | ORAL | 1 refills | Status: AC
  Filled 2024-01-26 (×2): qty 180, 90d supply, fill #0
  Filled 2024-07-24: qty 90, 45d supply, fill #1

## 2024-01-27 ENCOUNTER — Other Ambulatory Visit (HOSPITAL_COMMUNITY): Payer: Self-pay

## 2024-02-08 ENCOUNTER — Emergency Department (HOSPITAL_BASED_OUTPATIENT_CLINIC_OR_DEPARTMENT_OTHER)
Admission: EM | Admit: 2024-02-08 | Discharge: 2024-02-08 | Disposition: A | Discharge status: Home / Self Care | Attending: Emergency Medicine | Admitting: Emergency Medicine

## 2024-02-08 ENCOUNTER — Other Ambulatory Visit: Payer: Self-pay

## 2024-02-08 ENCOUNTER — Encounter (HOSPITAL_BASED_OUTPATIENT_CLINIC_OR_DEPARTMENT_OTHER): Payer: Self-pay

## 2024-02-08 DIAGNOSIS — T782XXA Anaphylactic shock, unspecified, initial encounter: Secondary | ICD-10-CM | POA: Diagnosis not present

## 2024-02-08 DIAGNOSIS — D72829 Elevated white blood cell count, unspecified: Secondary | ICD-10-CM | POA: Insufficient documentation

## 2024-02-08 DIAGNOSIS — Z7982 Long term (current) use of aspirin: Secondary | ICD-10-CM | POA: Insufficient documentation

## 2024-02-08 DIAGNOSIS — Z79899 Other long term (current) drug therapy: Secondary | ICD-10-CM | POA: Diagnosis not present

## 2024-02-08 DIAGNOSIS — I10 Essential (primary) hypertension: Secondary | ICD-10-CM | POA: Diagnosis not present

## 2024-02-08 DIAGNOSIS — R0602 Shortness of breath: Secondary | ICD-10-CM | POA: Diagnosis not present

## 2024-02-08 HISTORY — DX: Rheumatoid arthritis, unspecified: M06.9

## 2024-02-08 LAB — CBC
HCT: 43.2 % (ref 36.0–46.0)
Hemoglobin: 15.2 g/dL — ABNORMAL HIGH (ref 12.0–15.0)
MCH: 28.4 pg (ref 26.0–34.0)
MCHC: 35.2 g/dL (ref 30.0–36.0)
MCV: 80.6 fL (ref 80.0–100.0)
Platelets: 336 K/uL (ref 150–400)
RBC: 5.36 MIL/uL — ABNORMAL HIGH (ref 3.87–5.11)
RDW: 13.3 % (ref 11.5–15.5)
WBC: 15 K/uL — ABNORMAL HIGH (ref 4.0–10.5)
nRBC: 0 % (ref 0.0–0.2)

## 2024-02-08 LAB — BASIC METABOLIC PANEL WITH GFR
Anion gap: 19 — ABNORMAL HIGH (ref 5–15)
BUN: 27 mg/dL — ABNORMAL HIGH (ref 6–20)
CO2: 19 mmol/L — ABNORMAL LOW (ref 22–32)
Calcium: 9.9 mg/dL (ref 8.9–10.3)
Chloride: 101 mmol/L (ref 98–111)
Creatinine, Ser: 0.89 mg/dL (ref 0.44–1.00)
GFR, Estimated: >60 mL/min (ref >60)
Glucose, Bld: 134 mg/dL — ABNORMAL HIGH (ref 70–99)
Potassium: 3.5 mmol/L (ref 3.5–5.1)
Sodium: 139 mmol/L (ref 135–145)

## 2024-02-08 MED ORDER — HYDROXYZINE HCL 25 MG PO TABS
25.0000 mg | ORAL_TABLET | Freq: Three times a day (TID) | ORAL | 0 refills | Status: AC | PRN
  Filled 2024-02-08: qty 30, 10d supply, fill #0

## 2024-02-08 MED ORDER — PREDNISONE 50 MG PO TABS
50.0000 mg | ORAL_TABLET | Freq: Every day | ORAL | 0 refills | Status: AC
  Filled 2024-02-08: qty 5, 5d supply, fill #0

## 2024-02-08 MED ORDER — EPINEPHRINE 0.3 MG/0.3ML IJ SOAJ
0.3000 mg | INTRAMUSCULAR | 0 refills | Status: AC | PRN
  Filled 2024-02-08: qty 2, 30d supply, fill #0

## 2024-02-08 MED ORDER — HYDROCORTISONE 1 % EX CREA
TOPICAL_CREAM | Freq: Once | CUTANEOUS | Status: AC
  Administered 2024-02-08: 1 via TOPICAL
  Filled 2024-02-08: qty 28

## 2024-02-08 MED ORDER — FAMOTIDINE IN NACL 20-0.9 MG/50ML-% IV SOLN
20.0000 mg | Freq: Once | INTRAVENOUS | Status: AC
  Administered 2024-02-08: 20 mg via INTRAVENOUS
  Filled 2024-02-08: qty 50

## 2024-02-08 MED ORDER — EPINEPHRINE 0.3 MG/0.3ML IJ SOAJ
0.3000 mg | Freq: Once | INTRAMUSCULAR | Status: AC
  Administered 2024-02-08: 0.3 mg via INTRAMUSCULAR

## 2024-02-08 MED ORDER — SODIUM CHLORIDE 0.9 % IV BOLUS
1000.0000 mL | Freq: Once | INTRAVENOUS | Status: AC
  Administered 2024-02-08: 1000 mL via INTRAVENOUS

## 2024-02-08 MED ORDER — METHYLPREDNISOLONE SODIUM SUCC 125 MG IJ SOLR
125.0000 mg | Freq: Once | INTRAMUSCULAR | Status: AC
  Administered 2024-02-08: 125 mg via INTRAVENOUS
  Filled 2024-02-08: qty 2

## 2024-02-08 MED ORDER — SODIUM CHLORIDE 0.9 % IV SOLN: INTRAVENOUS | Status: DC

## 2024-02-08 MED ORDER — HYDROXYZINE HCL 25 MG PO TABS
50.0000 mg | ORAL_TABLET | Freq: Once | ORAL | Status: AC
  Administered 2024-02-08: 50 mg via ORAL
  Filled 2024-02-08: qty 2

## 2024-02-08 NOTE — ED Notes (Signed)
 RN reviewed discharge instructions with pt. Pt verbalized understanding and had no further questions. VSS upon discharge.

## 2024-02-08 NOTE — ED Provider Notes (Signed)
 [place] EMERGENCY DEPARTMENT AT [facility] Provider Note   CSN: [id number] Arrival date & time: 02/08/24  1733     Patient presents with: Allergic Reaction (/)   [name] is a 58 y.o. female.   Pt is a 58 yo female with pmhx significant for htn, depression, hld, tobacco abuse, kidney stones, and arthritis.  Pt does have allergy to bees and has never needed an epi pen, but sx worsen every time she gets stung.  She was stung today about 45 min ago.  She took 25 mg benadryl  and took a claritin.  She had some sob and took her albuterol  mdi which helped.  She is very itchy, has diffuse hives and has some swelling to her lips.         Prior to Admission medications   Medication Sig Start Date End Date Taking? Authorizing Provider  EPINEPHrine  0.3 mg/0.3 mL IJ SOAJ injection Inject 0.3 mg into the muscle as needed for anaphylaxis. 02/08/24  Yes Dean Clarity, MD  hydrOXYzine  (ATARAX ) 25 MG tablet Take 1 tablet (25 mg total) by mouth every 8 (eight) hours as needed for itching. 02/08/24  Yes Dean Clarity, MD  predniSONE  (DELTASONE ) 50 MG tablet Take 1 tablet (50 mg total) by mouth daily with breakfast. 02/08/24  Yes Dean Clarity, MD  acetaminophen  (TYLENOL ) 500 MG tablet Take 2 tablets (1,000 mg total) by mouth every 8 (eight) hours as needed for mild pain (or temp > 100.5). 05/07/21   Shepperson, Kirstin, PA-C  albuterol  (VENTOLIN  HFA) 108 (90 Base) MCG/ACT inhaler Inhale 1 puff by mouth every 4 hours as needed for shortness of breath and wheezing 06/28/21     amoxicillin  (AMOXIL ) 875 MG tablet Take 1 tablet by mouth twice daily for 10 days 11/01/22     amoxicillin -clavulanate (AUGMENTIN ) 875-125 MG tablet Take 1 tablet by mouth every 12 hours for 7 days 09/25/21     amoxicillin -clavulanate (AUGMENTIN ) 875-125 MG tablet Take 1 tablet by mouth every 12 hrs 7 days. 07/30/23     aspirin  325 MG EC tablet TAKE 1 TABLET BY MOUTH DAILY FOR 30 DAYS TO PREVENT BLOOD CLOTS   TAKE IT 12  HRS APART FROM CELECOXIB  05/07/21   Shepperson, Kirstin, PA-C  azithromycin  (ZITHROMAX ) 250 MG tablet Take 2 tablets by mouth on the first day, then take 1 tablet daily for 4 days 06/16/23     benzonatate  (TESSALON  PERLES) 100 MG capsule Take 1 capsule by mouth three times a day as needed for 10 days 06/28/21     benzonatate  (TESSALON ) 200 MG capsule Take 1 capsule (200 mg total) by mouth 3 (three) times daily as needed. 07/30/23     budesonide -formoterol  (SYMBICORT ) 80-4.5 MCG/ACT inhaler Inhale 2 puffs twice a day as needed for cough 07/29/23     buPROPion  (WELLBUTRIN  XL) 150 MG 24 hr tablet Take 1 tablet (150 mg total) by mouth daily for 30 days. 12/09/22     celecoxib  (CELEBREX ) 200 MG capsule DO NOT START THIS MEDICATION UNTIL 05/16/2021 THEN TAKE 1 CAPSULE BY MOUTH ONCE DAILY WITH FOOD FOR PAIN AND SWELLING Patient not taking: Reported on 05/08/2021 05/07/21   Shepperson, Kirstin, PA-C  chlorpheniramine-HYDROcodone  (TUSSIONEX) 10-8 MG/5ML Take 5 mL by mouth every 12 hours as needed 11/01/22     dexmethylphenidate  (FOCALIN ) 10 MG tablet Take 0.5-1 tablets (5-10 mg total) by mouth 2 (two) times daily as needed for focus during shift work 06/09/22     dexmethylphenidate  (FOCALIN ) 10 MG tablet Take  0.5-1 tablets (5-10 mg total) by mouth 2 (two) times daily as needed for focus during work shift. 03/03/23     dexmethylphenidate  (FOCALIN ) 10 MG tablet Take 0.5-1 tablets (5-10 mg total) by mouth 2 (two) times daily as needed. 04/27/23     dexmethylphenidate  (FOCALIN ) 10 MG tablet Take 0.5-1 tablets (5-10 mg total) by mouth 2 (two) times daily as needed FOR FOCUS DURING SHIFT WORK. 12/29/23     diclofenac Sodium (VOLTAREN) 1 % GEL Apply 1 application topically 2 (two) times daily as needed (pain). Patient not taking: Reported on 05/08/2021    [provider]  docusate sodium  (COLACE) 100 MG capsule TAKE 1 CAPSULE TWICE A DAY WHILE ON NARCOTICS  **STOOL SOFTENER 05/07/21   Shepperson, Kirstin, PA-C   doxycycline  (VIBRA -TABS) 100 MG tablet Take 1 tablet by mouth twice a day for 5 days 06/28/21     fluconazole  (DIFLUCAN ) 150 MG tablet Take 1 tablet by mouth once, may repeat in 3 days if needed. 07/30/23     fluticasone  (FLONASE ) 50 MCG/ACT nasal spray Place 1 spray into both nostrils daily.    [provider]  fluticasone  (FLOVENT  HFA) 44 MCG/ACT inhaler Inhale 2 puffs into the lungs twice a day 09/30/21     gabapentin  (NEURONTIN ) 300 MG capsule Take 1 capsule (300 mg total) by mouth at bedtime for nerve pain. 05/07/21 05/07/22  Shepperson, Kirstin, PA-C  HYDROcodone  bit-homatropine (HYDROMET) 5-1.5 MG/5ML syrup Take 5 MLs by mouth at bedtime as needed 09/25/21     metoprolol  succinate (TOPROL -XL) 50 MG 24 hr tablet TAKE 1 TABLET BY MOUTH ONCE DAILY 04/26/20 05/06/21  Loreli Kins, MD  metoprolol  succinate (TOPROL -XL) 50 MG 24 hr tablet Take 1 tablet (50 mg total) by mouth daily. 08/19/23     mupirocin  ointment (BACTROBAN ) 2 % Apply small pea size amount in each nostril using a clean Qtip twice a day for 14 days.  Then day 1 - 5 of each month for 6 months 04/24/21     polyethylene glycol (MIRALAX  / GLYCOLAX ) 17 g packet DRINK 17 GRAMS MIXED IN 6 OUNCES OF SOMETHING TO DRINK TWICE A DAY 05/07/21   Shepperson, Kirstin, PA-C  predniSONE  (DELTASONE ) 10 MG tablet Take 1 tablet (10 mg total) by mouth daily. 01/27/23     predniSONE  (DELTASONE ) 10 MG tablet Take 1 tablet (10 mg total) by mouth daily. 04/27/23     predniSONE  (DELTASONE ) 20 MG tablet Take 3 tabs daily for 2 days, then 2 tabs x 2 days, then 1 tab x 2 days in the morning for 6 days 09/25/21     predniSONE  (DELTASONE ) 20 MG tablet Take 1 tablet once a day in the morning  for 5 days 11/01/22     predniSONE  (DELTASONE ) 5 MG tablet Take 6 tablets by mouth daily for 2 days THEN take 5 tabs for 2 days THEN take  4 tabs for 2 days THEN take 3 tabs for 2 days THEN 2 days for 2 days THEN 1 tab for 2 days 07/31/23     valACYclovir  (VALTREX ) 1000 MG  tablet Take 2 tablets (2,000 mg total) by mouth 2 (two) times daily for 2 doses as needed for cold sores. 05/01/23     valACYclovir  (VALTREX ) 1000 MG tablet Take 2 tablets (2,000 mg total) by mouth 2 (two) times daily (for 2 doses) as needed FOR COLD SORES. 12/29/23     venlafaxine  XR (EFFEXOR -XR) 75 MG 24 hr capsule Take 2 capsules by mouth once daily 01/26/24  Allergies: Codeine, Lisinopril, Penicillins, Singulair [montelukast], and Erythromycin    Review of Systems  Skin:  Positive for rash.  All other systems reviewed and are negative.   Updated Vital Signs BP (!) 149/85   Pulse (!) 105   Temp 98.4 F (36.9 C)   Resp 20   Ht 5' 3.5 (1.613 m)   Wt 100.7 kg   LMP 04/22/2021 (Within Days) Comment: serum preg.pending.05/06/21.  patients states she is still having periods every other month.  SpO2 93%   BMI 38.71 kg/m   Physical Exam Vitals and nursing note reviewed.  Constitutional:      General: She is in acute distress.     Appearance: Normal appearance. She is obese. She is ill-appearing.  HENT:     Head: Normocephalic and atraumatic.     Right Ear: External ear normal.     Left Ear: External ear normal.     Nose: Nose normal.     Mouth/Throat:     Mouth: Mucous membranes are moist.     Pharynx: Oropharynx is clear.     Comments: Mild lower lip swelling Eyes:     Extraocular Movements: Extraocular movements intact.     Conjunctiva/sclera: Conjunctivae normal.     Pupils: Pupils are equal, round, and reactive to light.  Cardiovascular:     Rate and Rhythm: Regular rhythm. Tachycardia present.     Pulses: Normal pulses.     Heart sounds: Normal heart sounds.  Pulmonary:     Effort: Pulmonary effort is normal.     Breath sounds: Normal breath sounds.  Abdominal:     General: Abdomen is flat. Bowel sounds are normal.     Palpations: Abdomen is soft.  Musculoskeletal:        General: Normal range of motion.     Cervical back: Normal range of motion and neck  supple.  Skin:    General: Skin is warm.     Capillary Refill: Capillary refill takes less than 2 seconds.     Findings: Rash present. Rash is urticarial.  Neurological:     General: No focal deficit present.     Mental Status: She is alert and oriented to person, place, and time.  Psychiatric:        Mood and Affect: Mood normal.        Behavior: Behavior normal.     (all labs ordered are listed, but only abnormal results are displayed) Labs Reviewed  CBC - Abnormal; Notable for the following components:      Result Value   WBC 15.0 (*)    RBC 5.36 (*)    Hemoglobin 15.2 (*)    All other components within normal limits  BASIC METABOLIC PANEL WITH GFR - Abnormal; Notable for the following components:   CO2 19 (*)    Glucose, Bld 134 (*)    BUN 27 (*)    Anion gap 19 (*)    All other components within normal limits    EKG: None  Radiology: No results found.   Procedures   Medications Ordered in the ED  sodium chloride  0.9 % bolus 1,000 mL (0 mLs Intravenous Stopped 02/08/24 1951)    And  0.9 %  sodium chloride  infusion (0 mLs Intravenous Stopped 02/08/24 1910)  methylPREDNISolone  sodium succinate (SOLU-MEDROL ) 125 mg/2 mL injection 125 mg (125 mg Intravenous Given 02/08/24 1804)  famotidine  (PEPCID ) IVPB 20 mg premix (0 mg Intravenous Stopped 02/08/24 1856)  EPINEPHrine  (EPI-PEN) injection 0.3 mg (0.3 mg  Intramuscular Given 02/08/24 1737)  hydrocortisone  cream 1 % (1 Application Topical Given 02/08/24 2040)  hydrOXYzine  (ATARAX ) tablet 50 mg (50 mg Oral Given 02/08/24 2039)                                    Medical Decision Making Amount and/or Complexity of Data Reviewed Labs: ordered.  Risk Prescription drug management.   This patient presents to the ED for concern of allergy, this involves an extensive number of treatment options, and is a complaint that carries with it a high risk of complications and morbidity.  The differential diagnosis includes anaphylaxis,  allergic rxn   Co morbidities that complicate the patient evaluation  htn, depression, hld, tobacco abuse, kidney stones, and arthritis   Additional history obtained:  Additional history obtained from epic chart review  Lab Tests:  I Ordered, and personally interpreted labs.  The pertinent results include:  cbc with wbc elevated at 15   Cardiac Monitoring:  The patient was maintained on a cardiac monitor.  I personally viewed and interpreted the cardiac monitored which showed an underlying rhythm of: st initially, now improved   Medicines ordered and prescription drug management:  I ordered medication including epi pen, solumedrol, pepcid , ivfs  for sx  Reevaluation of the patient after these medicines showed that the patient improved I have reviewed the patients home medicines and have made adjustments as needed   Critical Interventions:  Epi pen   Problem List / ED Course:  Anaphylaxis:  pt has significantly improved after meds.  She was observed for 4 hrs and continues to improve.  She is stable for d/c.  She is to return if worse.  F/u with pcp.   Reevaluation:  After the interventions noted above, I reevaluated the patient and found that they have :improved   Social Determinants of Health:  Lives at home   Dispostion:  After consideration of the diagnostic results and the patients response to treatment, I feel that the patent would benefit from discharge with outpatient f/u.  CRITICAL CARE Performed by: Mliss Boyers   Total critical care time: 30 minutes  Critical care time was exclusive of separately billable procedures and treating other patients.  Critical care was necessary to treat or prevent imminent or life-threatening deterioration.  Critical care was time spent personally by me on the following activities: development of treatment plan with patient and/or surrogate as well as nursing, discussions with consultants, evaluation of patient's  response to treatment, examination of patient, obtaining history from patient or surrogate, ordering and performing treatments and interventions, ordering and review of laboratory studies, ordering and review of radiographic studies, pulse oximetry and re-evaluation of patient's condition.        Final diagnoses:  Anaphylaxis, initial encounter    ED Discharge Orders          Ordered    predniSONE  (DELTASONE ) 50 MG tablet  Daily with breakfast        02/08/24 2145    EPINEPHrine  0.3 mg/0.3 mL IJ SOAJ injection  As needed        02/08/24 2145    hydrOXYzine  (ATARAX ) 25 MG tablet  Every 8 hours PRN        02/08/24 2145               Shaylinn Hladik, MD 02/08/24 2146

## 2024-02-08 NOTE — ED Triage Notes (Signed)
 Pt was stung by some bees approx 45 min ago and then immediately started having a reaction with itchyness, urticaria, wheezing. Pt took 25 mg of benadryl  and used an albuterol  MDI PTA. Pt has had reactions previously to bee stings.

## 2024-02-09 ENCOUNTER — Other Ambulatory Visit (HOSPITAL_COMMUNITY): Payer: Self-pay

## 2024-02-23 ENCOUNTER — Other Ambulatory Visit (HOSPITAL_COMMUNITY): Payer: Self-pay

## 2024-02-23 MED ORDER — METOPROLOL SUCCINATE ER 50 MG PO TB24
50.0000 mg | ORAL_TABLET | Freq: Every day | ORAL | 0 refills | Status: DC
  Filled 2024-02-23: qty 90, 90d supply, fill #0

## 2024-03-29 DIAGNOSIS — Z23 Encounter for immunization: Secondary | ICD-10-CM | POA: Diagnosis not present

## 2024-04-13 DIAGNOSIS — E669 Obesity, unspecified: Secondary | ICD-10-CM | POA: Diagnosis not present

## 2024-04-13 DIAGNOSIS — K7581 Nonalcoholic steatohepatitis (NASH): Secondary | ICD-10-CM | POA: Diagnosis not present

## 2024-04-13 DIAGNOSIS — K8020 Calculus of gallbladder without cholecystitis without obstruction: Secondary | ICD-10-CM | POA: Diagnosis not present

## 2024-05-24 ENCOUNTER — Other Ambulatory Visit (HOSPITAL_COMMUNITY): Payer: Self-pay

## 2024-05-24 MED ORDER — METOPROLOL SUCCINATE ER 50 MG PO TB24
50.0000 mg | ORAL_TABLET | Freq: Every day | ORAL | 0 refills | Status: AC
  Filled 2024-05-24: qty 90, 90d supply, fill #0

## 2024-05-26 ENCOUNTER — Other Ambulatory Visit (HOSPITAL_COMMUNITY): Payer: Self-pay

## 2024-05-26 MED ORDER — DEXMETHYLPHENIDATE HCL 10 MG PO TABS
5.0000 mg | ORAL_TABLET | Freq: Two times a day (BID) | ORAL | 0 refills | Status: AC | PRN
  Filled 2024-05-26: qty 30, 15d supply, fill #0

## 2024-06-15 ENCOUNTER — Other Ambulatory Visit (HOSPITAL_COMMUNITY): Payer: Self-pay

## 2024-06-15 DIAGNOSIS — E782 Mixed hyperlipidemia: Secondary | ICD-10-CM | POA: Diagnosis not present

## 2024-06-15 DIAGNOSIS — Z23 Encounter for immunization: Secondary | ICD-10-CM | POA: Diagnosis not present

## 2024-06-15 DIAGNOSIS — R7303 Prediabetes: Secondary | ICD-10-CM | POA: Diagnosis not present

## 2024-06-15 DIAGNOSIS — K760 Fatty (change of) liver, not elsewhere classified: Secondary | ICD-10-CM | POA: Diagnosis not present

## 2024-06-15 DIAGNOSIS — I10 Essential (primary) hypertension: Secondary | ICD-10-CM | POA: Diagnosis not present

## 2024-06-15 DIAGNOSIS — Z0000 Encounter for general adult medical examination without abnormal findings: Secondary | ICD-10-CM | POA: Diagnosis not present

## 2024-06-15 DIAGNOSIS — F321 Major depressive disorder, single episode, moderate: Secondary | ICD-10-CM | POA: Diagnosis not present

## 2024-06-15 MED ORDER — ZEPBOUND 2.5 MG/0.5ML ~~LOC~~ SOAJ
2.5000 mg | SUBCUTANEOUS | 11 refills | Status: AC
  Filled 2024-06-15: qty 2, 28d supply, fill #0

## 2024-06-22 DIAGNOSIS — R194 Change in bowel habit: Secondary | ICD-10-CM | POA: Diagnosis not present

## 2024-06-23 ENCOUNTER — Other Ambulatory Visit (HOSPITAL_COMMUNITY): Payer: Self-pay

## 2024-06-23 MED ORDER — PREDNISONE 5 MG PO TABS
ORAL_TABLET | ORAL | 0 refills | Status: AC
  Filled 2024-06-23: qty 42, 12d supply, fill #0

## 2024-06-24 ENCOUNTER — Other Ambulatory Visit (HOSPITAL_COMMUNITY): Payer: Self-pay

## 2024-07-19 ENCOUNTER — Other Ambulatory Visit: Payer: Self-pay | Admitting: Family Medicine

## 2024-07-19 DIAGNOSIS — Z1231 Encounter for screening mammogram for malignant neoplasm of breast: Secondary | ICD-10-CM

## 2024-07-20 ENCOUNTER — Other Ambulatory Visit (HOSPITAL_COMMUNITY): Payer: Self-pay

## 2024-07-20 MED ORDER — BUPROPION HCL ER (XL) 150 MG PO TB24
150.0000 mg | ORAL_TABLET | Freq: Every morning | ORAL | 3 refills | Status: AC
  Filled 2024-07-20: qty 90, 90d supply, fill #0

## 2024-07-21 ENCOUNTER — Other Ambulatory Visit: Payer: Self-pay | Admitting: Medical Genetics

## 2024-07-24 ENCOUNTER — Other Ambulatory Visit (HOSPITAL_COMMUNITY): Payer: Self-pay

## 2024-07-27 ENCOUNTER — Ambulatory Visit
Admission: RE | Admit: 2024-07-27 | Discharge: 2024-07-27 | Disposition: A | Source: Ambulatory Visit | Attending: Family Medicine | Admitting: Family Medicine

## 2024-07-27 DIAGNOSIS — Z1231 Encounter for screening mammogram for malignant neoplasm of breast: Secondary | ICD-10-CM

## 2024-07-28 ENCOUNTER — Other Ambulatory Visit (HOSPITAL_COMMUNITY): Payer: Self-pay

## 2024-07-28 MED ORDER — PEG 3350-KCL-NA BICARB-NACL 420 G PO SOLR
ORAL | 0 refills | Status: AC
  Filled 2024-07-28: qty 4000, 1d supply, fill #0

## 2024-07-28 MED ORDER — BISACODYL 5 MG PO TBEC
20.0000 mg | DELAYED_RELEASE_TABLET | ORAL | 0 refills | Status: AC
  Filled 2024-07-28: qty 4, 1d supply, fill #0

## 2024-07-30 ENCOUNTER — Other Ambulatory Visit (HOSPITAL_COMMUNITY): Payer: Self-pay
